# Patient Record
Sex: Female | Born: 1960 | Race: White | Hispanic: No | Marital: Married | Smoking: Current every day smoker
Health system: Southern US, Community
[De-identification: ages and names within clinical notes are randomized; demographics above are authoritative.]

## PROBLEM LIST (undated history)

## (undated) DIAGNOSIS — Z87442 Personal history of urinary calculi: Secondary | ICD-10-CM

## (undated) DIAGNOSIS — J40 Bronchitis, not specified as acute or chronic: Secondary | ICD-10-CM

## (undated) DIAGNOSIS — R7303 Prediabetes: Secondary | ICD-10-CM

## (undated) DIAGNOSIS — K219 Gastro-esophageal reflux disease without esophagitis: Secondary | ICD-10-CM

## (undated) DIAGNOSIS — N920 Excessive and frequent menstruation with regular cycle: Secondary | ICD-10-CM

## (undated) DIAGNOSIS — F4320 Adjustment disorder, unspecified: Secondary | ICD-10-CM

## (undated) DIAGNOSIS — Z720 Tobacco use: Secondary | ICD-10-CM

## (undated) DIAGNOSIS — Z8669 Personal history of other diseases of the nervous system and sense organs: Secondary | ICD-10-CM

## (undated) DIAGNOSIS — R9431 Abnormal electrocardiogram [ECG] [EKG]: Secondary | ICD-10-CM

## (undated) DIAGNOSIS — E782 Mixed hyperlipidemia: Secondary | ICD-10-CM

## (undated) DIAGNOSIS — J449 Chronic obstructive pulmonary disease, unspecified: Secondary | ICD-10-CM

## (undated) DIAGNOSIS — M1990 Unspecified osteoarthritis, unspecified site: Secondary | ICD-10-CM

## (undated) DIAGNOSIS — I739 Peripheral vascular disease, unspecified: Secondary | ICD-10-CM

## (undated) DIAGNOSIS — E7800 Pure hypercholesterolemia, unspecified: Secondary | ICD-10-CM

## (undated) DIAGNOSIS — E039 Hypothyroidism, unspecified: Secondary | ICD-10-CM

## (undated) DIAGNOSIS — M4802 Spinal stenosis, cervical region: Secondary | ICD-10-CM

## (undated) DIAGNOSIS — M4692 Unspecified inflammatory spondylopathy, cervical region: Secondary | ICD-10-CM

## (undated) DIAGNOSIS — M5412 Radiculopathy, cervical region: Secondary | ICD-10-CM

## (undated) DIAGNOSIS — F17200 Nicotine dependence, unspecified, uncomplicated: Secondary | ICD-10-CM

## (undated) HISTORY — DX: Chronic obstructive pulmonary disease, unspecified: J44.9

## (undated) HISTORY — DX: Adjustment disorder, unspecified: F43.20

## (undated) HISTORY — PX: PELVIC LAPAROSCOPY: SHX162

## (undated) HISTORY — DX: Bronchitis, not specified as acute or chronic: J40

## (undated) HISTORY — DX: Nicotine dependence, unspecified, uncomplicated: F17.200

## (undated) HISTORY — DX: Excessive and frequent menstruation with regular cycle: N92.0

## (undated) HISTORY — DX: Abnormal electrocardiogram (ECG) (EKG): R94.31

## (undated) HISTORY — DX: Pure hypercholesterolemia, unspecified: E78.00

## (undated) HISTORY — PX: ESOPHAGOGASTRODUODENOSCOPY: SHX1529

## (undated) HISTORY — PX: COLONOSCOPY: SHX174

## (undated) HISTORY — PX: HAMMER TOE SURGERY: SHX385

## (undated) HISTORY — DX: Hypothyroidism, unspecified: E03.9

## (undated) HISTORY — DX: Unspecified osteoarthritis, unspecified site: M19.90

## (undated) HISTORY — DX: Personal history of other diseases of the nervous system and sense organs: Z86.69

## (undated) HISTORY — PX: TUBAL LIGATION: SHX77

---

## 1998-03-07 ENCOUNTER — Encounter: Admission: RE | Admit: 1998-03-07 | Discharge: 1998-03-07 | Payer: Self-pay | Admitting: *Deleted

## 1999-09-13 ENCOUNTER — Emergency Department (HOSPITAL_COMMUNITY): Admission: EM | Admit: 1999-09-13 | Discharge: 1999-09-13 | Payer: Self-pay | Admitting: Emergency Medicine

## 2000-08-07 ENCOUNTER — Emergency Department (HOSPITAL_COMMUNITY): Admission: EM | Admit: 2000-08-07 | Discharge: 2000-08-08 | Payer: Self-pay | Admitting: Emergency Medicine

## 2000-08-08 ENCOUNTER — Encounter: Payer: Self-pay | Admitting: Emergency Medicine

## 2000-09-17 ENCOUNTER — Inpatient Hospital Stay (HOSPITAL_COMMUNITY): Admission: EM | Admit: 2000-09-17 | Discharge: 2000-09-18 | Payer: Self-pay | Admitting: *Deleted

## 2000-12-10 ENCOUNTER — Emergency Department (HOSPITAL_COMMUNITY): Admission: EM | Admit: 2000-12-10 | Discharge: 2000-12-10 | Payer: Self-pay | Admitting: *Deleted

## 2001-02-06 ENCOUNTER — Emergency Department (HOSPITAL_COMMUNITY): Admission: EM | Admit: 2001-02-06 | Discharge: 2001-02-06 | Payer: Self-pay | Admitting: Emergency Medicine

## 2001-02-06 ENCOUNTER — Encounter: Payer: Self-pay | Admitting: Emergency Medicine

## 2001-03-11 ENCOUNTER — Other Ambulatory Visit: Admission: RE | Admit: 2001-03-11 | Discharge: 2001-03-11 | Payer: Self-pay | Admitting: *Deleted

## 2001-04-19 ENCOUNTER — Ambulatory Visit (HOSPITAL_COMMUNITY): Admission: RE | Admit: 2001-04-19 | Discharge: 2001-04-19 | Payer: Self-pay | Admitting: *Deleted

## 2001-04-19 ENCOUNTER — Encounter (INDEPENDENT_AMBULATORY_CARE_PROVIDER_SITE_OTHER): Payer: Self-pay

## 2001-09-21 ENCOUNTER — Emergency Department (HOSPITAL_COMMUNITY): Admission: EM | Admit: 2001-09-21 | Discharge: 2001-09-21 | Payer: Self-pay

## 2002-02-18 ENCOUNTER — Emergency Department (HOSPITAL_COMMUNITY): Admission: EM | Admit: 2002-02-18 | Discharge: 2002-02-18 | Payer: Self-pay | Admitting: Emergency Medicine

## 2002-05-19 ENCOUNTER — Emergency Department (HOSPITAL_COMMUNITY): Admission: EM | Admit: 2002-05-19 | Discharge: 2002-05-19 | Payer: Self-pay | Admitting: Emergency Medicine

## 2002-06-12 ENCOUNTER — Emergency Department (HOSPITAL_COMMUNITY): Admission: EM | Admit: 2002-06-12 | Discharge: 2002-06-12 | Payer: Self-pay | Admitting: Emergency Medicine

## 2002-06-12 ENCOUNTER — Encounter: Payer: Self-pay | Admitting: Emergency Medicine

## 2002-10-20 ENCOUNTER — Emergency Department (HOSPITAL_COMMUNITY): Admission: EM | Admit: 2002-10-20 | Discharge: 2002-10-20 | Payer: Self-pay | Admitting: Emergency Medicine

## 2003-04-25 ENCOUNTER — Emergency Department (HOSPITAL_COMMUNITY): Admission: EM | Admit: 2003-04-25 | Discharge: 2003-04-26 | Payer: Self-pay | Admitting: Emergency Medicine

## 2004-04-08 ENCOUNTER — Emergency Department (HOSPITAL_COMMUNITY): Admission: EM | Admit: 2004-04-08 | Discharge: 2004-04-08 | Payer: Self-pay | Admitting: Emergency Medicine

## 2004-05-28 ENCOUNTER — Emergency Department (HOSPITAL_COMMUNITY): Admission: EM | Admit: 2004-05-28 | Discharge: 2004-05-28 | Payer: Self-pay | Admitting: Emergency Medicine

## 2004-07-03 ENCOUNTER — Emergency Department (HOSPITAL_COMMUNITY): Admission: EM | Admit: 2004-07-03 | Discharge: 2004-07-03 | Payer: Self-pay | Admitting: Emergency Medicine

## 2005-06-10 ENCOUNTER — Emergency Department (HOSPITAL_COMMUNITY): Admission: EM | Admit: 2005-06-10 | Discharge: 2005-06-10 | Payer: Self-pay | Admitting: Emergency Medicine

## 2005-07-28 ENCOUNTER — Emergency Department (HOSPITAL_COMMUNITY): Admission: EM | Admit: 2005-07-28 | Discharge: 2005-07-28 | Payer: Self-pay | Admitting: Emergency Medicine

## 2005-08-26 ENCOUNTER — Ambulatory Visit: Payer: Self-pay | Admitting: Family Medicine

## 2005-10-22 ENCOUNTER — Ambulatory Visit: Payer: Self-pay | Admitting: Family Medicine

## 2005-10-30 ENCOUNTER — Ambulatory Visit: Payer: Self-pay | Admitting: Family Medicine

## 2005-10-30 ENCOUNTER — Other Ambulatory Visit: Admission: RE | Admit: 2005-10-30 | Discharge: 2005-10-30 | Payer: Self-pay | Admitting: Family Medicine

## 2005-10-30 LAB — CONVERTED CEMR LAB
Pap Smear: NORMAL
TSH: 4.73 u[IU]/mL

## 2005-11-13 ENCOUNTER — Ambulatory Visit: Payer: Self-pay | Admitting: Obstetrics & Gynecology

## 2005-11-19 ENCOUNTER — Ambulatory Visit (HOSPITAL_COMMUNITY): Admission: RE | Admit: 2005-11-19 | Discharge: 2005-11-19 | Payer: Self-pay | Admitting: Obstetrics and Gynecology

## 2005-11-19 ENCOUNTER — Encounter: Admission: RE | Admit: 2005-11-19 | Discharge: 2005-11-19 | Payer: Self-pay | Admitting: Family Medicine

## 2005-11-24 ENCOUNTER — Encounter: Admission: RE | Admit: 2005-11-24 | Discharge: 2005-11-24 | Payer: Self-pay | Admitting: Family Medicine

## 2005-12-04 ENCOUNTER — Ambulatory Visit: Payer: Self-pay | Admitting: Obstetrics & Gynecology

## 2006-07-19 ENCOUNTER — Emergency Department (HOSPITAL_COMMUNITY): Admission: EM | Admit: 2006-07-19 | Discharge: 2006-07-19 | Payer: Self-pay | Admitting: Emergency Medicine

## 2007-01-18 ENCOUNTER — Emergency Department (HOSPITAL_COMMUNITY): Admission: EM | Admit: 2007-01-18 | Discharge: 2007-01-18 | Payer: Self-pay | Admitting: Family Medicine

## 2007-03-16 ENCOUNTER — Encounter: Payer: Self-pay | Admitting: Family Medicine

## 2007-03-16 DIAGNOSIS — N926 Irregular menstruation, unspecified: Secondary | ICD-10-CM | POA: Insufficient documentation

## 2007-03-16 DIAGNOSIS — E039 Hypothyroidism, unspecified: Secondary | ICD-10-CM | POA: Insufficient documentation

## 2007-03-16 DIAGNOSIS — J309 Allergic rhinitis, unspecified: Secondary | ICD-10-CM | POA: Insufficient documentation

## 2007-03-16 DIAGNOSIS — L509 Urticaria, unspecified: Secondary | ICD-10-CM | POA: Insufficient documentation

## 2007-03-16 DIAGNOSIS — F17200 Nicotine dependence, unspecified, uncomplicated: Secondary | ICD-10-CM | POA: Insufficient documentation

## 2007-03-16 DIAGNOSIS — J4489 Other specified chronic obstructive pulmonary disease: Secondary | ICD-10-CM | POA: Insufficient documentation

## 2007-03-16 DIAGNOSIS — F4320 Adjustment disorder, unspecified: Secondary | ICD-10-CM | POA: Insufficient documentation

## 2007-03-16 DIAGNOSIS — Z87898 Personal history of other specified conditions: Secondary | ICD-10-CM | POA: Insufficient documentation

## 2007-03-16 DIAGNOSIS — J449 Chronic obstructive pulmonary disease, unspecified: Secondary | ICD-10-CM | POA: Insufficient documentation

## 2007-03-23 ENCOUNTER — Ambulatory Visit: Payer: Self-pay | Admitting: Family Medicine

## 2007-03-23 DIAGNOSIS — R9431 Abnormal electrocardiogram [ECG] [EKG]: Secondary | ICD-10-CM | POA: Insufficient documentation

## 2007-03-24 ENCOUNTER — Ambulatory Visit: Payer: Self-pay | Admitting: Family Medicine

## 2007-04-09 ENCOUNTER — Ambulatory Visit: Payer: Self-pay | Admitting: Cardiology

## 2007-04-14 LAB — CONVERTED CEMR LAB: TSH: 19.3 microintl units/mL — ABNORMAL HIGH (ref 0.35–5.50)

## 2007-05-06 ENCOUNTER — Emergency Department (HOSPITAL_COMMUNITY): Admission: EM | Admit: 2007-05-06 | Discharge: 2007-05-06 | Payer: Self-pay | Admitting: Emergency Medicine

## 2007-05-11 ENCOUNTER — Ambulatory Visit: Payer: Self-pay | Admitting: Family Medicine

## 2007-05-14 ENCOUNTER — Encounter (INDEPENDENT_AMBULATORY_CARE_PROVIDER_SITE_OTHER): Payer: Self-pay | Admitting: *Deleted

## 2007-05-14 LAB — CONVERTED CEMR LAB: TSH: 10.14 microintl units/mL — ABNORMAL HIGH (ref 0.35–5.50)

## 2007-06-28 ENCOUNTER — Encounter (INDEPENDENT_AMBULATORY_CARE_PROVIDER_SITE_OTHER): Payer: Self-pay | Admitting: *Deleted

## 2007-07-06 ENCOUNTER — Ambulatory Visit: Payer: Self-pay | Admitting: Family Medicine

## 2007-07-12 ENCOUNTER — Encounter (INDEPENDENT_AMBULATORY_CARE_PROVIDER_SITE_OTHER): Payer: Self-pay | Admitting: *Deleted

## 2007-07-12 LAB — CONVERTED CEMR LAB: TSH: 7.73 microintl units/mL — ABNORMAL HIGH (ref 0.35–5.50)

## 2007-08-26 ENCOUNTER — Ambulatory Visit: Payer: Self-pay | Admitting: Family Medicine

## 2007-08-27 LAB — CONVERTED CEMR LAB: TSH: 5.28 microintl units/mL (ref 0.35–5.50)

## 2008-02-29 ENCOUNTER — Ambulatory Visit: Payer: Self-pay | Admitting: Family Medicine

## 2008-02-29 DIAGNOSIS — E7800 Pure hypercholesterolemia, unspecified: Secondary | ICD-10-CM | POA: Insufficient documentation

## 2008-03-02 LAB — CONVERTED CEMR LAB
Cholesterol: 246 mg/dL (ref 0–200)
Direct LDL: 137.8 mg/dL
HDL: 59.7 mg/dL (ref >39.0)
TSH: 13.17 microintl units/mL — ABNORMAL HIGH (ref 0.35–5.50)
Total CHOL/HDL Ratio: 4.1
Triglycerides: 175 mg/dL — ABNORMAL HIGH (ref 0–149)
VLDL: 35 mg/dL (ref 0–40)

## 2008-03-15 ENCOUNTER — Ambulatory Visit: Payer: Self-pay | Admitting: Family Medicine

## 2008-05-11 ENCOUNTER — Ambulatory Visit: Payer: Self-pay | Admitting: Family Medicine

## 2008-05-15 LAB — CONVERTED CEMR LAB: TSH: 0.33 microintl units/mL — ABNORMAL LOW (ref 0.35–5.50)

## 2008-05-19 ENCOUNTER — Encounter (INDEPENDENT_AMBULATORY_CARE_PROVIDER_SITE_OTHER): Payer: Self-pay | Admitting: *Deleted

## 2008-06-27 ENCOUNTER — Ambulatory Visit: Payer: Self-pay | Admitting: Family Medicine

## 2008-06-30 LAB — CONVERTED CEMR LAB: TSH: 54.96 microintl units/mL — ABNORMAL HIGH (ref 0.35–5.50)

## 2008-07-08 ENCOUNTER — Emergency Department (HOSPITAL_COMMUNITY): Admission: EM | Admit: 2008-07-08 | Discharge: 2008-07-08 | Payer: Self-pay | Admitting: Family Medicine

## 2008-07-13 ENCOUNTER — Telehealth: Payer: Self-pay | Admitting: Family Medicine

## 2008-07-14 ENCOUNTER — Ambulatory Visit: Payer: Self-pay | Admitting: Family Medicine

## 2008-07-27 ENCOUNTER — Telehealth: Payer: Self-pay | Admitting: Family Medicine

## 2008-07-31 ENCOUNTER — Ambulatory Visit: Payer: Self-pay | Admitting: Family Medicine

## 2008-08-02 ENCOUNTER — Encounter (INDEPENDENT_AMBULATORY_CARE_PROVIDER_SITE_OTHER): Payer: Self-pay | Admitting: *Deleted

## 2008-08-02 LAB — CONVERTED CEMR LAB
Free T4: 0.8 ng/dL (ref 0.6–1.6)
TSH: 13.49 microintl units/mL — ABNORMAL HIGH (ref 0.35–5.50)

## 2008-10-02 ENCOUNTER — Ambulatory Visit: Payer: Self-pay | Admitting: Family Medicine

## 2008-10-02 LAB — CONVERTED CEMR LAB: TSH: 5.14 microintl units/mL (ref 0.35–5.50)

## 2008-10-05 ENCOUNTER — Ambulatory Visit: Payer: Self-pay | Admitting: Family Medicine

## 2008-11-21 ENCOUNTER — Telehealth: Payer: Self-pay | Admitting: Family Medicine

## 2009-01-26 ENCOUNTER — Ambulatory Visit: Payer: Self-pay | Admitting: Family Medicine

## 2009-02-02 ENCOUNTER — Ambulatory Visit: Payer: Self-pay | Admitting: Family Medicine

## 2009-02-02 ENCOUNTER — Encounter: Admission: RE | Admit: 2009-02-02 | Discharge: 2009-02-02 | Payer: Self-pay | Admitting: Family Medicine

## 2009-02-05 ENCOUNTER — Telehealth: Payer: Self-pay | Admitting: Family Medicine

## 2009-02-05 LAB — CONVERTED CEMR LAB: TSH: 2.587 microintl units/mL (ref 0.350–4.500)

## 2009-02-14 ENCOUNTER — Telehealth: Payer: Self-pay | Admitting: Family Medicine

## 2009-03-05 ENCOUNTER — Ambulatory Visit: Payer: Self-pay | Admitting: Family Medicine

## 2009-03-05 DIAGNOSIS — N920 Excessive and frequent menstruation with regular cycle: Secondary | ICD-10-CM | POA: Insufficient documentation

## 2009-03-07 ENCOUNTER — Telehealth: Payer: Self-pay | Admitting: Family Medicine

## 2009-03-09 LAB — CONVERTED CEMR LAB
Basophils Absolute: 0.1 10*3/uL (ref 0.0–0.1)
Basophils Relative: 1.5 % (ref 0.0–3.0)
Eosinophils Absolute: 0.1 10*3/uL (ref 0.0–0.7)
Eosinophils Relative: 1.7 % (ref 0.0–5.0)
HCT: 39.1 % (ref 36.0–46.0)
Hemoglobin: 13.2 g/dL (ref 12.0–15.0)
Lymphocytes Relative: 33.8 % (ref 12.0–46.0)
Lymphs Abs: 2.9 10*3/uL (ref 0.7–4.0)
MCHC: 33.8 g/dL (ref 30.0–36.0)
MCV: 99.1 fL (ref 78.0–100.0)
Monocytes Absolute: 0.5 10*3/uL (ref 0.1–1.0)
Monocytes Relative: 6.2 % (ref 3.0–12.0)
Neutro Abs: 5.1 10*3/uL (ref 1.4–7.7)
Neutrophils Relative %: 56.8 % (ref 43.0–77.0)
Platelets: 221 10*3/uL (ref 150.0–400.0)
RBC: 3.95 M/uL (ref 3.87–5.11)
RDW: 12.3 % (ref 11.5–14.6)
WBC: 8.7 10*3/uL (ref 4.5–10.5)
hCG, Beta Chain, Quant, S: 0.69 milliintl units/mL

## 2009-03-20 ENCOUNTER — Encounter: Payer: Self-pay | Admitting: Obstetrics & Gynecology

## 2009-03-20 ENCOUNTER — Ambulatory Visit: Payer: Self-pay | Admitting: Obstetrics & Gynecology

## 2009-04-10 ENCOUNTER — Ambulatory Visit: Payer: Self-pay | Admitting: Obstetrics & Gynecology

## 2009-04-11 ENCOUNTER — Ambulatory Visit: Payer: Self-pay | Admitting: Family Medicine

## 2009-05-09 ENCOUNTER — Ambulatory Visit: Payer: Self-pay | Admitting: Obstetrics & Gynecology

## 2009-05-09 LAB — CONVERTED CEMR LAB
ALT: 26 units/L (ref 0–35)
AST: 21 units/L (ref 0–37)
Albumin: 4.3 g/dL (ref 3.5–5.2)
Alkaline Phosphatase: 89 units/L (ref 39–117)
BUN: 13 mg/dL (ref 6–23)
CO2: 23 meq/L (ref 19–32)
Calcium: 9.1 mg/dL (ref 8.4–10.5)
Chloride: 102 meq/L (ref 96–112)
Creatinine, Ser: 0.84 mg/dL (ref 0.40–1.20)
Glucose, Bld: 75 mg/dL (ref 70–99)
HCT: 43.8 % (ref 36.0–46.0)
Hemoglobin: 14.3 g/dL (ref 12.0–15.0)
MCHC: 32.6 g/dL (ref 30.0–36.0)
MCV: 97.6 fL (ref 78.0–100.0)
Platelets: 249 10*3/uL (ref 150–400)
Potassium: 4.6 meq/L (ref 3.5–5.3)
RBC: 4.49 M/uL (ref 3.87–5.11)
RDW: 13.1 % (ref 11.5–15.5)
Sodium: 139 meq/L (ref 135–145)
Total Bilirubin: 0.4 mg/dL (ref 0.3–1.2)
Total Protein: 6.6 g/dL (ref 6.0–8.3)
WBC: 8.4 10*3/uL (ref 4.0–10.5)

## 2009-05-25 ENCOUNTER — Ambulatory Visit (HOSPITAL_COMMUNITY): Admission: RE | Admit: 2009-05-25 | Discharge: 2009-05-25 | Payer: Self-pay | Admitting: Obstetrics & Gynecology

## 2009-12-03 ENCOUNTER — Telehealth: Payer: Self-pay | Admitting: Family Medicine

## 2009-12-05 ENCOUNTER — Ambulatory Visit: Payer: Self-pay | Admitting: Family Medicine

## 2009-12-10 ENCOUNTER — Telehealth: Payer: Self-pay | Admitting: Family Medicine

## 2009-12-12 LAB — CONVERTED CEMR LAB: TSH: 7.63 microintl units/mL — ABNORMAL HIGH (ref 0.35–5.50)

## 2009-12-13 ENCOUNTER — Encounter (INDEPENDENT_AMBULATORY_CARE_PROVIDER_SITE_OTHER): Payer: Self-pay | Admitting: *Deleted

## 2010-01-24 ENCOUNTER — Encounter: Admission: RE | Admit: 2010-01-24 | Discharge: 2010-01-24 | Payer: Self-pay | Admitting: Internal Medicine

## 2010-05-24 ENCOUNTER — Ambulatory Visit: Payer: Self-pay | Admitting: Family Medicine

## 2010-05-24 DIAGNOSIS — J019 Acute sinusitis, unspecified: Secondary | ICD-10-CM | POA: Insufficient documentation

## 2010-05-26 LAB — CONVERTED CEMR LAB
Glucose, Bld: 67 mg/dL — ABNORMAL LOW (ref 70–99)
TSH: 6.5 microintl units/mL — ABNORMAL HIGH (ref 0.35–5.50)

## 2010-06-11 ENCOUNTER — Ambulatory Visit: Payer: Self-pay | Admitting: Family Medicine

## 2010-06-11 DIAGNOSIS — R05 Cough: Secondary | ICD-10-CM

## 2010-06-11 DIAGNOSIS — R059 Cough, unspecified: Secondary | ICD-10-CM | POA: Insufficient documentation

## 2010-07-14 ENCOUNTER — Encounter: Payer: Self-pay | Admitting: Obstetrics & Gynecology

## 2010-07-23 NOTE — Progress Notes (Signed)
Summary: time for blood work  Phone Note Call from Patient Call back at [employer] [phone]   Caller: Patient Call For: Judith Part MD Summary of Call: Pt says she is due for blood work, please advise on what to order and if you want to see her.  Please call her after 2 to let her know. Initial call taken by: Lowella Petties CMA,  December 03, 2009 12:49 PM  Follow-up for Phone Call        -+I want to check tsh for 244.9- when able  f/u with me in about 3 mo  Follow-up by: Judith Part MD,  December 03, 2009 1:23 PM  Additional Follow-up for Phone Call Additional follow up Details #1::        Patient Advised.   Lab appointment scheduled:  12/05/09.  Patient wishes to wait until after labs to schedule OV. Additional Follow-up by: Delilah Shan CMA Duncan Dull),  December 03, 2009 3:11 PM

## 2010-07-23 NOTE — Assessment & Plan Note (Signed)
Summary: SINUS INFECTION/DLO   Vital Signs:  Patient profile:   50 year old female Height:      61 inches Weight:      116.50 pounds BMI:     22.09 Temp:     98 degrees F oral Pulse rate:   84 / minute Pulse rhythm:   regular BP sitting:   110 / 60  (left arm) Cuff size:   regular  Vitals Entered By: Lewanda Rife LPN (May 24, 2010 10:29 AM) CC: sinus bothering pt, h/a facial,l pain, productive cough with yellow green mucus, night sweats   History of Present Illness: sinus problems started getting it last week  started with uri symptoms  is now having headache and facial pain  taking her allegra and mucinex and 12 hour store brand cough med  mucous is yellow and green from nose  cough is dry   is still smoking -- is really working on cutting down  less than 1/2 ppd not ready to set quit date due to stress     took flu shot for job     also due for tsh check  Allergies (verified): No Known Drug Allergies  Past History:  Past Medical History: Last updated: 07/14/2008 Allergic rhinitis COPD Hypothyroidism 10/08 abn ETT-- pt refused further evaluation smoker   cardiol-- Dr Daleen Squibb  Past Surgical History: Last updated: 03/15/2008 Hammer toes- surgery Laparoscopy- pelvic Tubal ligation Pneumonia (08/2000) Pelvic US- neg (08/2005) abn Exercise stress test 10/08 pneumonia- tx as outpt 08  Family History: Last updated: 03/16/2007 Father: no contact Mother: ? depression Siblings:   Social History: Last updated: 01/26/2009 Marital Status: Married-- much stress at home with marriage Children: 3 Occupation: Futures trader  Risk Factors: Smoking Status: current (03/16/2007)  Review of Systems General:  Complains of chills, fatigue, and malaise. Eyes:  Denies blurring, discharge, and eye irritation. ENT:  Complains of earache, nasal congestion, postnasal drainage, sinus pressure, and sore throat; denies ear discharge. CV:  Denies chest  pain or discomfort and palpitations. Resp:  Complains of cough and sputum productive; denies wheezing. GI:  Denies diarrhea, nausea, and vomiting. MS:  Denies cramps. Derm:  Denies itching, lesion(s), poor wound healing, and rash. Neuro:  Denies numbness and tingling. Psych:  Denies anxiety and depression. Endo:  Denies cold intolerance, excessive thirst, excessive urination, and heat intolerance. Heme:  Denies abnormal bruising and bleeding.  Physical Exam  General:  slim and well appearing Head:  tender sinuses diffusely -- frontal and maxillary normocephalic, atraumatic, and no abnormalities observed.   Eyes:  vision grossly intact, pupils equal, pupils round, pupils reactive to light, and no injection.   Ears:  TMs are dull Nose:  nares are injected and congested bilaterally  Mouth:  pharynx pink and moist.   Neck:  supple with full rom and no masses or thyromegally, no JVD or carotid bruit  Lungs:  difusely distant bs cta  no wheeze or rales  Heart:  Normal rate and regular rhythm. S1 and S2 normal without gallop, murmur, click, rub or other extra sounds. Skin:  Intact without suspicious lesions or rashes Cervical Nodes:  No lymphadenopathy noted Psych:  normal affect, talkative and pleasant    Impression & Recommendations:  Problem # 1:  SINUSITIS - ACUTE-NOS (ICD-461.9) Assessment New tx with augmentin  adv to quit smoking  tessalon for cough  pt advised to update me if symptoms worsen or do not improve recommend sympt care- see pt instructions   Her  updated medication list for this problem includes:    Flonase 50 Mcg/act Susp (Fluticasone propionate) .Marland Kitchen... As needed once a day    Mucinex 600 Mg Xr12h-tab (Guaifenesin) ..... Otc as directed.    Delsym 30 Mg/76ml Lqcr (Dextromethorphan polistirex) ..... Otc as directed.    Augmentin 875-125 Mg Tabs (Amoxicillin-pot clavulanate) .Marland Kitchen... 1 by mouth two times a day for 10 days for sinus infection    Tessalon 200 Mg Caps  (Benzonatate) .Marland Kitchen... 1 by mouth three times a day as needed for cough  Orders: Prescription Created Electronically [phone])  Problem # 2:  Hx of TOBACCO ABUSE (ICD-305.1) Assessment: Unchanged discussed in detail risks of smoking, and possible outcomes including COPD, vascular dz, cancer and also respiratory infections/sinus problems  pt voiced understanding  is cutting back  Problem # 3:  HYPOTHYROIDISM (ICD-244.9) Assessment: Unchanged overdue tsh no clinical changes  no missed doses after result wants 90 d px for med to pharmacy Her updated medication list for this problem includes:    Synthroid 100 Mcg Tabs (Levothyroxine sodium) .Marland Kitchen... Alternate 1/2 tab and 1 day everyday  Orders: Venipuncture (95284) TLB-TSH (Thyroid Stimulating Hormone) (84443-TSH) TLB-Glucose, QUANT (82947-GLU)  Complete Medication List: 1)  Synthroid 100 Mcg Tabs (Levothyroxine sodium) .... Alternate 1/2 tab and 1 day everyday 2)  Flonase 50 Mcg/act Susp (Fluticasone propionate) .... As needed once a day 3)  Tizanidine Hcl 4 Mg Tabs (Tizanidine hcl) .Marland Kitchen.. 1 by mouth at bedtime as needed 4)  Ibuprofen 800 Mg Tabs (Ibuprofen) .... Take one tab up to three times a day as needed neck pain 5)  Allegra Allergy 180 Mg Tabs (Fexofenadine hcl) .... Otc as directed. 6)  Mucinex 600 Mg Xr12h-tab (Guaifenesin) .... Otc as directed. 7)  Delsym 30 Mg/18ml Lqcr (Dextromethorphan polistirex) .... Otc as directed. 8)  Augmentin 875-125 Mg Tabs (Amoxicillin-pot clavulanate) .Marland Kitchen.. 1 by mouth two times a day for 10 days for sinus infection 9)  Tessalon 200 Mg Caps (Benzonatate) .Marland Kitchen.. 1 by mouth three times a day as needed for cough  Patient Instructions: 1)  you can try mucinex over the counter twice daily as directed and nasal saline spray for congestion 2)  tylenol over the counter as directed may help with aches, headache and fever 3)  call if symptoms worsen or if not improved in 4-5 days  4)  please make effort to quit  smoking  5)  take the augmentin for sinus infection and also tessalon for cough  Prescriptions: TESSALON 200 MG CAPS (BENZONATATE) 1 by mouth three times a day as needed for cough  #30 x 0   Entered and Authorized by:   Judith Part MD   Signed by:   Judith Part MD on 05/24/2010   Method used:   Electronically to        [facility] [phone]* (retail)       [address]       [place], [place]  [address]       Ph: [phone]       Fax: [phone]   RxID:   [id number] AUGMENTIN 875-125 MG TABS (AMOXICILLIN-POT CLAVULANATE) 1 by mouth two times a day for 10 days for sinus infection  #20 x 0   Entered and Authorized by:   Judith Part MD   Signed by:   Judith Part MD on 05/24/2010   Method used:   Electronically to        [facility] [phone]* (retail)  [address]       [place], [place]  [address]       Ph: [phone]       Fax: [phone]   RxID:   [id number]    Orders Added: 1)  Venipuncture [25956] 2)  TLB-TSH (Thyroid Stimulating Hormone) [84443-TSH] 3)  TLB-Glucose, QUANT [82947-GLU] 4)  Prescription Created Electronically [G8553] 5)  Est. Patient Level III [38756]    Current Allergies (reviewed today): No known allergies

## 2010-07-23 NOTE — Progress Notes (Signed)
Summary: regarding synthroid  Phone Note Call from Patient Call back at Home Phone [phone]   Caller: Patient Call For: Judith Part MD Summary of Call: Pt had told me that she only missed one dose on thyroid medicine but now she says she probably missed several because sometimes she was not able to afford it and had to wait a few days before she could get it.  She is coming in to see you in september and asks if you want her to continue with what's she's doing until then. Initial call taken by: Lowella Petties CMA,  December 10, 2009 3:48 PM  Follow-up for Phone Call        Dr. Milinda Antis has addressed this and I have been trying to get her on the phone.  There is no answer and no VM.  If she calls back, please see lab report on my desktop. Follow-up by: Delilah Shan CMA Duncan Dull),  December 11, 2009 9:36 AM  Additional Follow-up for Phone Call Additional follow up Details #1::        if she has missed that many doses --can probably continue this dosing and re check in fall Additional Follow-up by: Judith Part MD,  December 12, 2009 3:14 PM

## 2010-07-23 NOTE — Letter (Signed)
Summary: Out of Work  [facility] at [facility]  [address] [place], [place] [address]   Phone: [phone]  Fax: [phone]    May 24, 2010   Employee:  Melanie Crazier    To Whom It May Concern:   For Medical reasons, please excuse the above named employee from work for the following dates:  Start:   05/20/2010  End:   05/23/2010  If you need additional information, please feel free to contact our office.         Sincerely,    Judith Part MD

## 2010-07-23 NOTE — Letter (Signed)
Summary: Results Follow up Letter  [place] at [facility]  [address] [place], [place] [address]   Phone: [phone]  Fax: [phone]    12/13/2009 MRN: [id number]    Lauren Beck [address] [place], [place]  [address]    Dear [name],  The following are the results of your recent test(s):  Test         Result    Pap Smear:        Normal _____  Not Normal _____ Comments: ______________________________________________________ Cholesterol: LDL(Bad cholesterol):         Your goal is less than:         HDL (Good cholesterol):       Your goal is more than: Comments:  ______________________________________________________ Mammogram:        Normal _____  Not Normal _____ Comments:  ___________________________________________________________________ Hemoccult:        Normal _____  Not normal _______ Comments:    _____________________________________________________________________ Other Tests:   Concerning your thyroid medication, if you have missed that many doses --you can probably continue this dosing and re check in the fall before we make any changes.     We routinely do not discuss normal results over the telephone.  If you desire a copy of the results, or you have any questions about this information we can discuss them at your next office visit.   Sincerely,    Marne A. Milinda Antis, M.D.  MAT:lsf

## 2010-07-25 ENCOUNTER — Other Ambulatory Visit: Payer: Self-pay | Admitting: Family Medicine

## 2010-07-25 ENCOUNTER — Other Ambulatory Visit: Payer: Self-pay

## 2010-07-25 ENCOUNTER — Encounter (INDEPENDENT_AMBULATORY_CARE_PROVIDER_SITE_OTHER): Payer: Self-pay | Admitting: *Deleted

## 2010-07-25 ENCOUNTER — Ambulatory Visit: Admit: 2010-07-25 | Payer: Self-pay | Admitting: Family Medicine

## 2010-07-25 DIAGNOSIS — E039 Hypothyroidism, unspecified: Secondary | ICD-10-CM

## 2010-07-25 LAB — TSH: TSH: 1.19 u[IU]/mL (ref 0.35–5.50)

## 2010-07-25 LAB — T4, FREE: Free T4: 1.08 ng/dL (ref 0.60–1.60)

## 2010-07-25 NOTE — Assessment & Plan Note (Signed)
Summary: SINUS SYMPTOMS ARE NO BETTER/ lb   Vital Signs:  Patient profile:   50 year old female Height:      61 inches Weight:      116.50 pounds BMI:     22.09 Temp:     97.7 degrees F oral Pulse rate:   72 / minute Pulse rhythm:   regular BP sitting:   116 / 80  (left arm) Cuff size:   large  Vitals Entered By: Delilah Shan CMA Duncan Dull) (June 11, 2010 9:09 AM) CC: Sinus symptoms are no better   History of Present Illness: 1/2 ppd smoker.  Was up to 1.5ppd.  Was seen by Dr. Milinda Antis recently.  Congestion is some better.  Cough worse at night.  Cough is the main issue.  No FCNAVD.  The prev sweats got some better.  No sputum now with cough.  Occ rhinorrhea, occ blood spotting with blowing nose.  Occ wheeze.  Using albuterol once - twice a day.  Transient relief.  prev was on advair.  Prev was on augmentin.    Allergies: No Known Drug Allergies  Review of Systems       See HPI.  Otherwise negative.    Physical Exam  General:  GEN: nad, alert and oriented HEENT: mucous membranes moist, TM w/o erythema, nasal epithelium injected, OP with cobblestoning NECK: supple w/o LA CV: rrr. PULM: no inc wob but diffuse exp wheeze.  no focal decrease in bs.  ABD: soft, +bs EXT: no edema    Impression & Recommendations:  Problem # 1:  COUGH (ICD-786.2) cont SABA and start advair 250/50, 1 puff two times a day and rinse mouth.  d/w patient [id number].  She understood.  okay for outpatient follow up.  Fu with Dr. Milinda Antis as needed.   Complete Medication List: 1)  Flonase 50 Mcg/act Susp (Fluticasone propionate) .... As needed once a day 2)  Tizanidine Hcl 4 Mg Tabs (Tizanidine hcl) .Marland Kitchen.. 1 by mouth at bedtime as needed 3)  Ibuprofen 800 Mg Tabs (Ibuprofen) .... Take one tab up to three times a day as needed neck pain 4)  Allegra Allergy 180 Mg Tabs (Fexofenadine hcl) .... Otc as directed. 5)  Mucinex 600 Mg Xr12h-tab (Guaifenesin) .... Otc as directed. 6)  Delsym 30 Mg/3ml Lqcr  (Dextromethorphan polistirex) .... Otc as directed. 7)  Augmentin 875-125 Mg Tabs (Amoxicillin-pot clavulanate) .Marland Kitchen.. 1 by mouth two times a day for 10 days for sinus infection 8)  Tessalon 200 Mg Caps (Benzonatate) .Marland Kitchen.. 1 by mouth three times a day as needed for cough 9)  Synthroid 88 Mcg Tabs (Levothyroxine sodium) .Marland Kitchen.. 1 by mouth qd 10)  Tussin Cf 30-10-100 Mg/4ml Liqd (Pseudoephedrine-dm-gg) .... As directed as needed . 11)  Ventolin Hfa 108 (90 Base) Mcg/act Aers (Albuterol sulfate) .... 2 puffs q4h as needed for cough 12)  Advair Diskus 250-50 Mcg/dose Aepb (Fluticasone-salmeterol) .Marland Kitchen.. 1 puff bid  Patient Instructions: 1)  Work on stopping smoking.   2)  Use the ventolin as needed- 2 puffs every 4 hours for cough. 3)  Start the advair 250/50 1 puff two times a day and rinse after use.  Let Dr. Milinda Antis know if you aren't gradually improving over the next few weeks.   4)  Take care.  Prescriptions: VENTOLIN HFA 108 (90 BASE) MCG/ACT AERS (ALBUTEROL SULFATE) 2 puffs q4h as needed for cough  #1 x 5   Entered and Authorized by:   Crawford Givens MD   Signed by:  Crawford Givens MD on 06/11/2010   Method used:   Electronically to        [facility] [phone]* (retail)       [address]       [place], [place]  [address]       Ph: [phone]       Fax: [phone]   RxID:   [id number]    Orders Added: 1)  Est. Patient Level III [23557]    Current Allergies (reviewed today): No known allergies

## 2010-08-05 ENCOUNTER — Telehealth: Payer: Self-pay | Admitting: Family Medicine

## 2010-08-14 NOTE — Progress Notes (Signed)
Summary: regarding neck pain  Phone Note Call from Patient Call back at Home Phone [phone]   Caller: Patient Call For: Lauren Beck Summary of Call: Pt was seen over a year ago by Dr. Patsy Lager for neck pain.  She says there is nothing she can do about this and the neck pain causes her to have frequent headaches.  She is asking what she can take for this.  She has been taking one ibuprofen, 600 mg's, but only takes this once in awhile.  Pains are sometimes sharp, like a knife.  Please advise on what else she can do.  Uses [facility] [address]. Initial call taken by: Lowella Petties CMA, AAMA,  August 05, 2010 3:09 PM  Follow-up for Phone Call        I would recommend flexeril and ibuprofen (if it does not bother stomach) (tizanadine is on med list instead of flexeril -- which one works better for her?- both muscle relaxers) can up ibuprofen to 800 PT eventually when she can afford it  if headache more than 2 d per week f/u let me know and I will px  Follow-up by: Lauren Beck,  August 05, 2010 5:05 PM  Additional Follow-up for Phone Call Additional follow up Details #1::        Patient notified as instructed by telephone. Pt said she wants to try the Tizanadine and also needs rx called in for Ibuprofen 800mg . Pt requested med sent to [facility] [address]  Pt said no need to call back to let her know med was called in [facility] always calls pt when ready.Lewanda Rife LPN  August 05, 2010 5:18 PM     Additional Follow-up for Phone Call Additional follow up Details #2::    px written on EMR for call in  Follow-up by: Lauren Beck,  August 05, 2010 5:23 PM  Additional Follow-up for Phone Call Additional follow up Details #3:: Details for Additional Follow-up Action Taken: Medications phoned to [facility] as instructed. Lewanda Rife LPN  August 05, 2010 5:27 PM   New/Updated Medications: TIZANIDINE HCL 4 MG TABS (TIZANIDINE HCL) 1 by mouth once daily as  needed headache / neck pain IBUPROFEN 800 MG TABS (IBUPROFEN) take one tab up to three times a day as needed neck pain or headache with food Prescriptions: IBUPROFEN 800 MG TABS (IBUPROFEN) take one tab up to three times a day as needed neck pain or headache with food  #60 x 3   Entered and Authorized by:   Lauren Beck   Signed by:   Lauren Beck on 08/05/2010   Method used:   Telephoned to ...       [facility] [address] [phone]* (retail)       [address]       [place], [place]  [address]       Ph: [phone]       Fax: [phone]   RxID:   [id number] TIZANIDINE HCL 4 MG TABS (TIZANIDINE HCL) 1 by mouth once daily as needed headache / neck pain  #30 x 1   Entered and Authorized by:   Lauren Beck   Signed by:   Lauren Beck on 08/05/2010   Method used:   Telephoned to ...       [facility] [phone]* (retail)       [address]  [place], [place]  [address]       Ph: [phone]       Fax: [phone]   RxID:   [id number]

## 2010-09-29 IMAGING — CT CT ABDOMEN W/ CM
2 of 5 series · 17 of 46 positions shown, 19 images · IV contrast (APPLIED)
Comparison: None

CT ABDOMEN

CLINICAL DATA: Progressive sharp right lower quadrant and pelvic
pain.  Clinical suspicion for appendicitis.  Previous surgery for
ovarian cysts.

CT ABDOMEN AND PELVIS WITH CONTRAST
TECHNIQUE: Multidetector CT imaging of the abdomen and pelvis was
performed using the standard protocol following bolus
administration of intravenous contrast.
Contrast: 80 ml Lmnipaque-CGG and oral contrast

[Series 2: abd/pelv with 5.0 b31f st · axial · 0.63mm/px · z∈[-445,-80]mm · 14 of 83 slices shown, 16 images]
[im 5/83  soft-tissue]
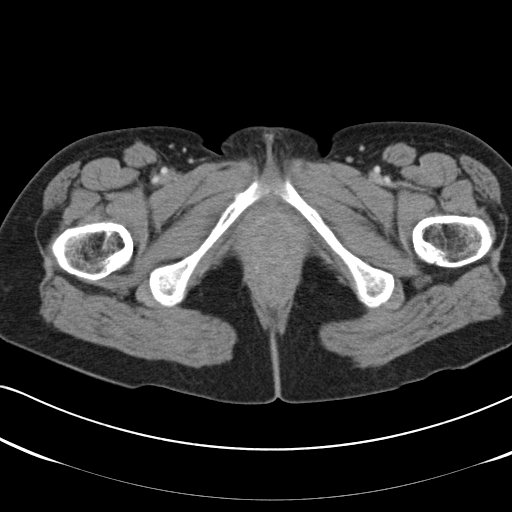
[im 5/83  bone]
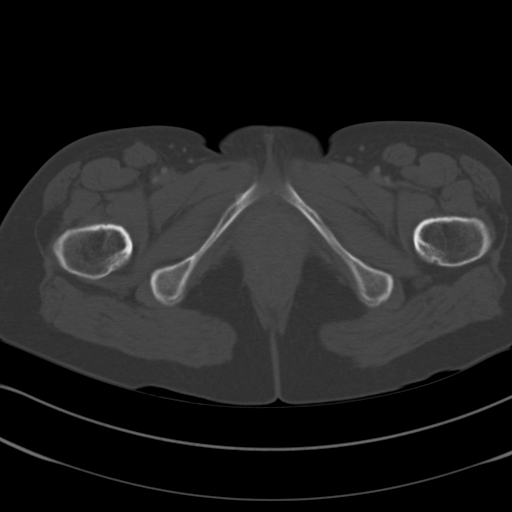
[im 10/83  soft-tissue]
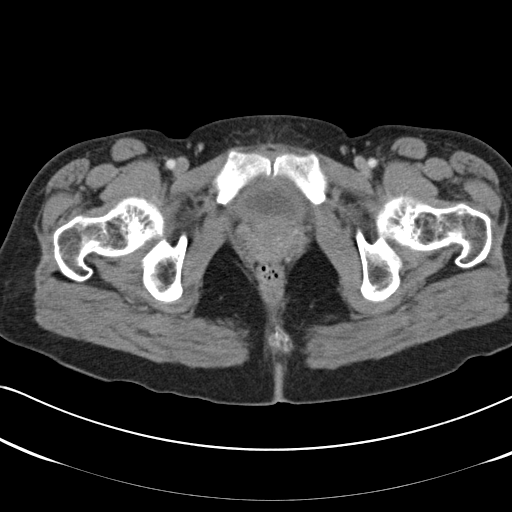
[im 19/83  soft-tissue]
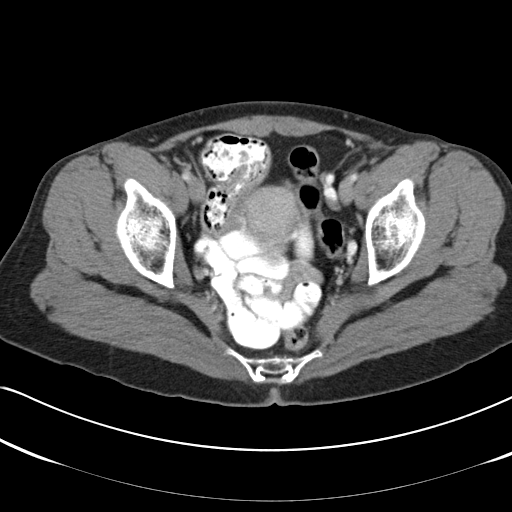
[im 23/83  soft-tissue]
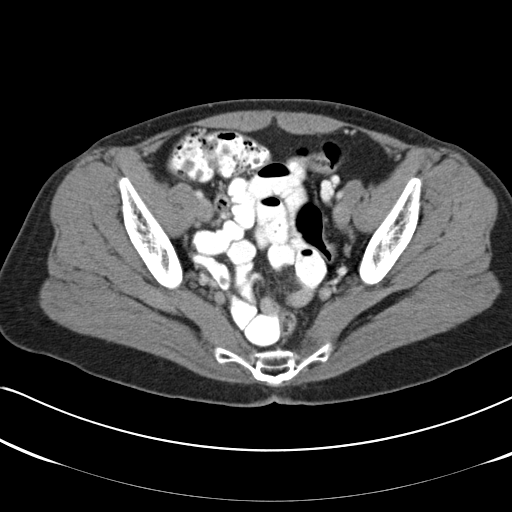
[im 28/83  soft-tissue]
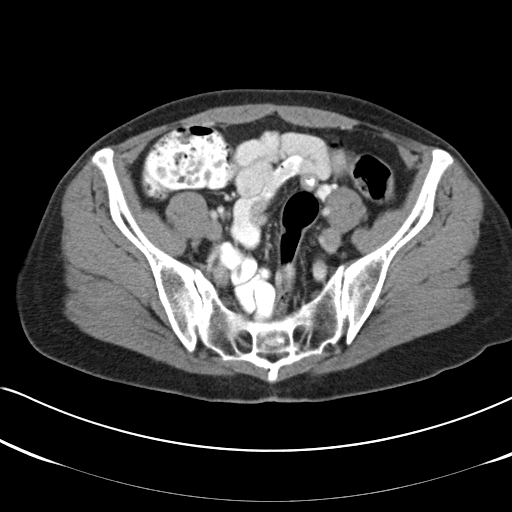
[im 32/83  soft-tissue]
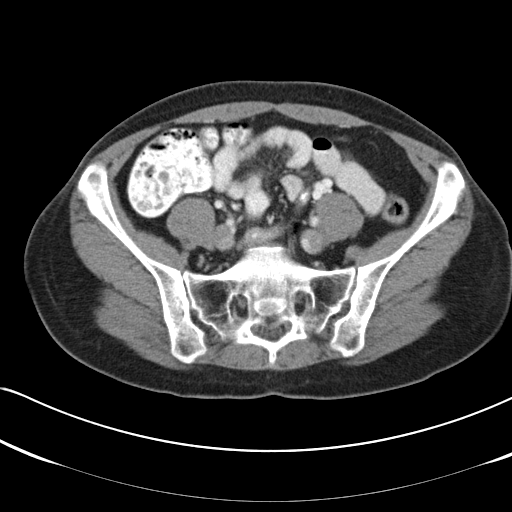
[im 37/83  soft-tissue]
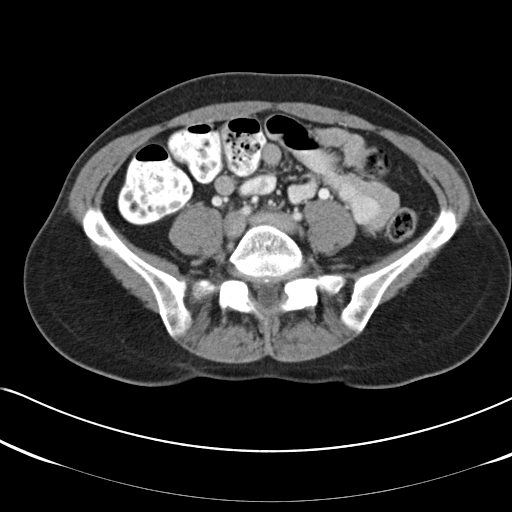
[im 46/83  soft-tissue]
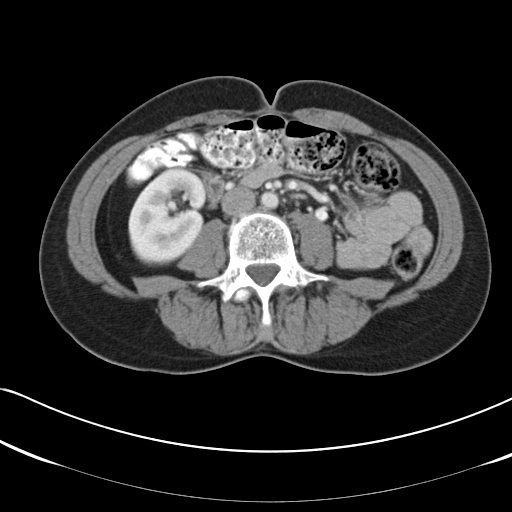
[im 51/83  soft-tissue]
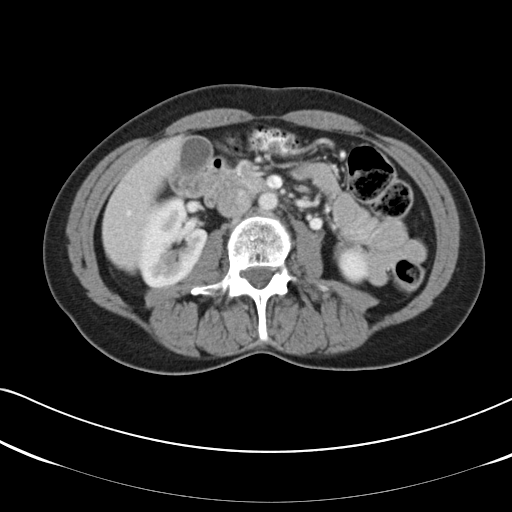
[im 51/83  bone]
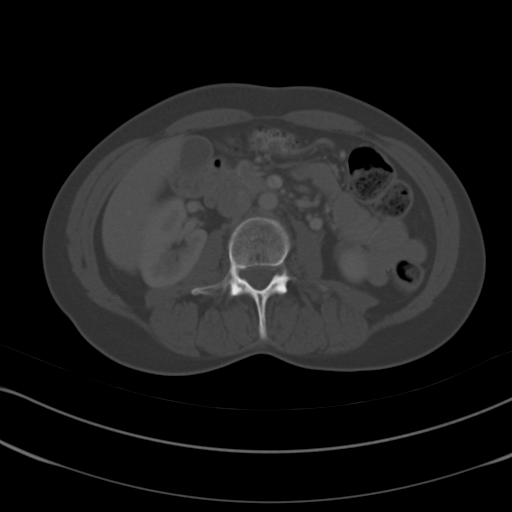
[im 55/83  soft-tissue]
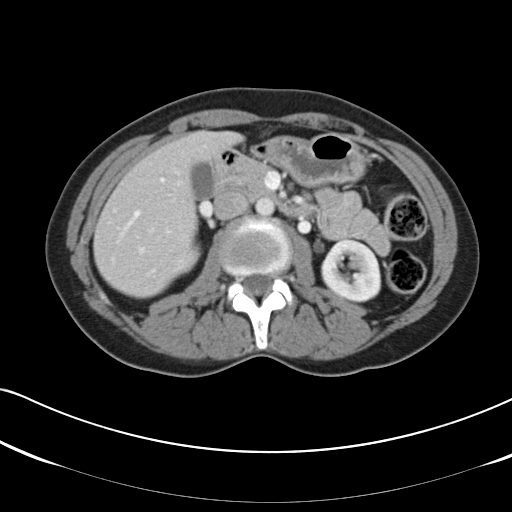
[im 60/83  soft-tissue]
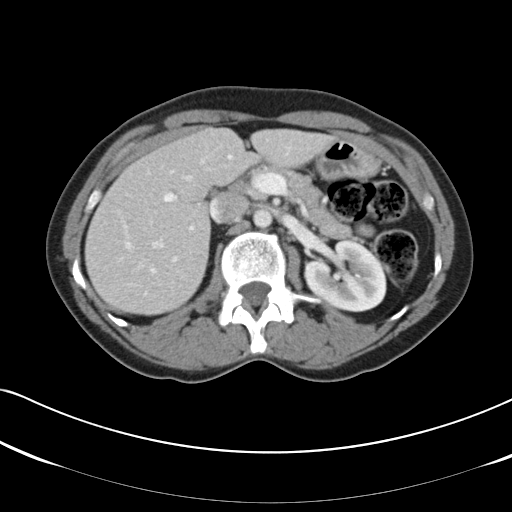
[im 64/83  soft-tissue]
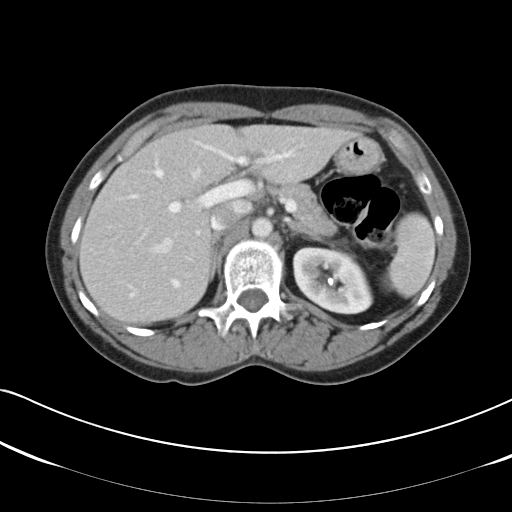
[im 73/83  soft-tissue]
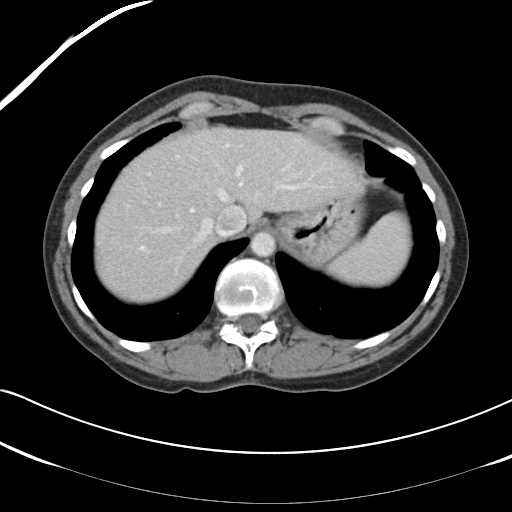
[im 78/83  soft-tissue]
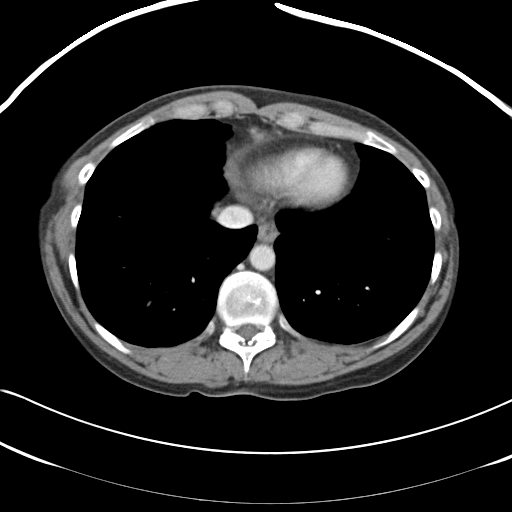

[Series 602: cor · coronal · 0.80mm/px · 3 of 62 slices shown]
[im 21/62  soft-tissue]
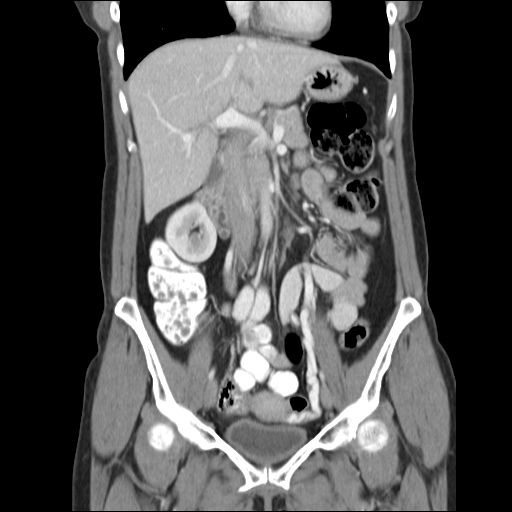
[im 28/62  soft-tissue]
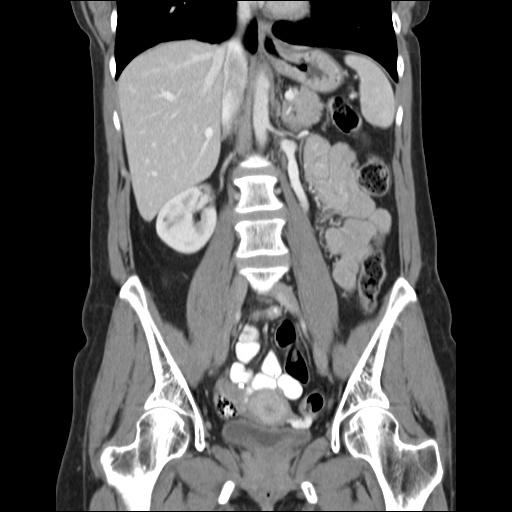
[im 34/62  soft-tissue]
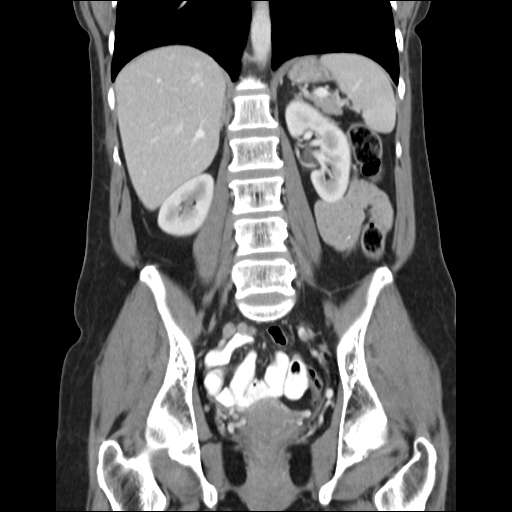

[17 of 46 positions shown; findings below may reference images not displayed]

FINDINGS: The liver, gallbladder, spleen, pancreas, adrenal
glands, and kidneys are normal appearance.  Gallbladder appears
normal, and there is no evidence of hydronephrosis.

No soft tissue masses or adenopathy identified.  There is no
evidence of inflammatory process or abnormal fluid collections.  No
evidence of dilated bowel loops.
IMPRESSION: Negative abdomen CT.

CT PELVIS
FINDINGS: Appendix is not well visualized on the study, however
there is no evidence of inflammatory process in the area the cecum
were elsewhere within the pelvis.  No abnormal fluid collections
are seen in the pelvis.  Uterus and adnexal regions are
unremarkable appearance.  No pelvic soft tissue masses or
adenopathy identified.  There is no evidence of dilated bowel loops
or hernia.
IMPRESSION: Negative pelvis CT.  No evidence of appendicitis or other acute
findings.

## 2010-10-03 ENCOUNTER — Other Ambulatory Visit: Payer: Self-pay | Admitting: Family Medicine

## 2010-10-03 DIAGNOSIS — E039 Hypothyroidism, unspecified: Secondary | ICD-10-CM

## 2010-10-04 ENCOUNTER — Other Ambulatory Visit (INDEPENDENT_AMBULATORY_CARE_PROVIDER_SITE_OTHER): Payer: Self-pay

## 2010-10-04 DIAGNOSIS — E039 Hypothyroidism, unspecified: Secondary | ICD-10-CM

## 2010-10-04 LAB — TSH: TSH: 2.417 u[IU]/mL (ref 0.350–4.500)

## 2010-10-04 NOTE — Progress Notes (Signed)
Addended byMills Koller on: 10/04/2010 03:28 PM   Modules accepted: Orders

## 2010-10-05 LAB — T4, FREE: Free T4: 1.37 ng/dL (ref 0.80–1.80)

## 2010-11-05 NOTE — Assessment & Plan Note (Signed)
[facility]                            CARDIOLOGY OFFICE NOTE   NAME:Lauren Beck                         MRN:          [id number]  DATE:04/09/2007                            DOB:          10/08/1960    [name] is referred today by Dr. 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  She brings copies of her  tracings today.   HISTORY OF PRESENT ILLNESS:  She is a pleasant 50 year old, married,  white female who comes in today with the above concern.  She performed  an exercise stress test on a bicycle on March 03, 2007.  She had ST  segment depression in III and aVF that was fairly horizontal.  It  persisted in recovery for several minutes.  This was not found on a  stress test in 2007.  She was told she had atherosclerosis of her  coronary arteries and needed clearance.  She has dyspnea on exertion but  denies any angina.  She is a smoker and has a chest x-ray from 2007,  that shows COPD.  She smokes about a 1/2 a pack to a pack per day.   Her other cardiac risk factors include:  1. Hyperlipidemia.  2. There is no premature history of coronary disease in her family.  3. She does not do any recreational drugs.  4. She is not diabetic.  5. She does not have hypertension.   PAST MEDICAL HISTORY:  1. She has problems with chronic bronchitis.  2. She has had several significant pulmonary infections.  3. She has hammer toes, operated on in 2001.   She is on no medications.  She takes ibuprofen p.r.n.   REVIEW OF SYSTEMS:  History of allergies and hay-fever, asthma,  emphysema, arthritis, and tyroid disease apparently.  She is not on any  Synthroid or thyroid replacement.   PHYSICAL EXAMINATION:  GENERAL:  She is a petite lady in no acute  distress.  VITAL SIGNS:  Respiratory rate is 18 and unlabored.  She is 5 feet 1  inch, weighs 107  pounds.  Her blood pressure is 102/60, pulse if 76 and  regular.  Her electrocardiogram is normal except for a borderline short  PR interval.  HEENT:  Normocephalic atraumatic.  Extraocular movements intact.  Sclerae are clear.  Facial symmetry is normal.  NECK:  Supple.  Carotid upstrokes are equal bilaterally without bruits.  No JVD.  Thyroid is not enlarged.  Trachea is midline.  LUNGS:  Reveal inspiratory/expiratory rhonchi, decreased breath sounds  throughout.  HEART:  Reveals a nondisplaced PMI.  She has normal S1 S2.  ABDOMEN:  Soft.  Good bowel sounds.  EXTREMITIES:  Reveals no cyanosis, clubbing, or edema.  Pulses are  intact.  NEUROLOGIC:  Intact.   ASSESSMENT/PLAN:  [name] has new findings on a stress EKG.  They are  not definitive but they certainly are suggestive of some coronary  ischemia.  Her only symptom of concern  is dyspnea on exertion which is  probably more related to her chronic obstructive pulmonary disease and  chronic bronchitis.  We have had a long talk about that today.  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  I do not think we need to go as far as  a catheterization at this point.  She is going to get back with [place] as  soon as she can make this part of her schedule.     Thomas C. Daleen Squibb, MD, [facility]  Electronically Signed    TCW/MedQ  DD: 04/09/2007  DT: 04/11/2007  Job #: [id number]   cc:   Marne A. Milinda Antis, MD

## 2010-11-05 NOTE — Assessment & Plan Note (Signed)
[name], Beck NO.:  [id number]   MEDICAL RECORD NO.:  [id number]          PATIENT TYPE:  POB   LOCATION:  [facility] at [facility]         FACILITY:  [facility]   PHYSICIAN:  Allie Bossier, MD        DATE OF BIRTH:  1960-09-30   DATE OF SERVICE:  03/20/2009                                  CLINIC NOTE   HISTORY OF PRESENT ILLNESS:  Ms. [name] is a 50 year old married white  gravida 3, para 3, who comes in here for an annual exam.  Her only  complaint is that of having a very heavy period 2 weeks ago, this is the  first cycle that she has had since 2007.  She does complain of  occasional vaginal dryness with intercourse.   PAST MEDICAL HISTORY:  Hypothyroidism (she sees Dr. Milinda Antis and says that  her TSH was normal recently).  She also has neck pain and sees Dr.  Dallas Schimke.   REVIEW OF SYSTEMS:  She has been married for 6 years.  She works at [employer], Mining engineer.  The remainder of the review of systems and  questions are negative.  Please note her Pap smear and mammogram have  not been done since 2007.   FAMILY HISTORY:  Negative for breast, GYN, and colon malignancy.   ALLERGIES:  No known drug allergies.   SOCIAL HISTORY:  She smokes half pack a day for the last 16 years.  Drinks occasionally and denies drugs.  She does report she has about 5  caffeinated beverages a day.   MEDICATIONS:  Synthroid 100 mcg every other day, 50 mcg every other day,  and she takes some unidentified pain medicines for her neck.  She says  she will call in with the name of it.   PHYSICAL EXAMINATION:  VITAL SIGNS:  Weight 110, height 5 feet 1 inch,  blood pressure 115/73, and pulse 76.  HEENT:  Normal.  BREASTS:  Normal bilaterally.  ABDOMEN:  Benign scaphoid.  No hepatosplenomegaly.  EXTERNAL GENITALIA:  Shaved.  No lesions.  Cervix, normal discharge, no  lesions.  Uterus is normal size and shape and mobile and midplane,  however, she does seem to be in some discomfort  with a bimanual exam.  Her ovaries are small, but her entire pelvic exam elicits expressions of  discomfort on her face.   ASSESSMENT AND PLAN:  1. Annual exam.  I have checked Pap smear and will schedule a      mammogram.  Recommended self-breast and self-vulvar exams monthly.  2. Postmenopausal bleeding.  I will get an ultrasound to determine the      endometrial stripe with she will probably      need some form of endometrial sampling (endometrial biopsy versus D      and C/hysteroscopy).  She will follow up in 1-2 weeks (after her      ultrasound).      Allie Bossier, MD     MCD/MEDQ  D:  03/20/2009  T:  03/21/2009  Job:  [id number]

## 2010-11-05 NOTE — Assessment & Plan Note (Signed)
Lauren Beck, ARANAS NO.:  [id number]   MEDICAL RECORD NO.:  [id number]          PATIENT TYPE:  POB   LOCATION:  [facility] at [facility]         FACILITY:  [facility]   PHYSICIAN:  Allie Bossier, MD        DATE OF BIRTH:  01-22-1961   DATE OF SERVICE:                                  CLINIC NOTE   Kyri is a 50 year old married white gravida 3, para 3, who is seen  here for her annual exam on March 20, 2009.  She complained of an  occasion of postmenopausal bleeding on March 06, 2009.  The workup  for that has been thorough.  She has had an ultrasound that showed her  endometrial lining to be 4.3 cm, otherwise normal ultrasound.  She had  an endometrial biopsy done which showed no evidence of atypia or  malignancy.  Her Pap smear did come back as atypical squamous cells of  undetermined significance, but the HPV was negative, so she will need  her next Pap smear in a year.  Her complaint today is that of pelvic  pain.  She says that since March 06, 2009, when she had the occasion  of postmenopausal bleeding, she has been experiencing intermittent  (about once a week) right lower quadrant pain.  This pain can be on a  scale of 3/10 up to a 10/10.  She takes some ibuprofen and it helps  sometimes.  She says that is not related to bowel or bladder function,  but can sometimes be provoked with intercourse.   On physical exam, her abdomen is scaphoid.  I cannot elicit the pain  that she is describing by deep palpation.  I have explained to her that  I do not think that this is a life-threatening condition.  However, I am  unable to explain it at this moment.  I have offered her watchful  waiting versus continued workup, in the form of a CT scan.  She is  adamant that she needs to know why she is hurting, so I am ordering a  CT of her abdomen and pelvis with and without contrast.  I am also going  to get a CBC and CMET.      Allie Bossier, MD     MCD/MEDQ  D:   05/09/2009  T:  05/10/2009  Job:  [id number]

## 2010-11-08 NOTE — H&P (Signed)
[facility]  Patient:    Lauren Beck, Lauren Beck         MRN: [id number] Adm. Date:  [id number] Attending:  Ephriam Knuckles H                         History and Physical  HISTORY:  This is a 50 year old white female who was admitted through the emergency room for treatment of pneumonia.  She has a two week history of head and chest congestion with cough and a one day history of headache, chest congestion, cough, fever, and chills.  She was brought to the emergency room where evaluation showed PO2 of 67.9.  Chest x-ray was negative; however, CT scan showed right middle lobe pneumonia.  Her white blood count was 17.5.  She was admitted for further treatment of pneumonia.  PAST MEDICAL HISTORY:  Significant for allergic rhinitis as well as hypothyroidism.  She did stop taking her thyroid medications approximately six months ago when she ran out and did not get them refilled.  No hospitalizations.  SOCIAL HISTORY:  She just moved back to this area.  At this time she is not working.  She does smoke two packs per day.  ALLERGIES:  No known drug allergies.  REVIEW OF SYSTEMS:  Negative except as above.  PHYSICAL EXAMINATION  GENERAL:  Thin female in no distress.  HEENT:  Tympanic membranes are clear.  Fundi not evaluated.  Throat is clear.  NECK:  Supple without adenopathy or thyromegaly.  CARDIAC:  Regular rhythm with no murmurs or gallops.  LUNGS:  Decreased right mid chest breath sounds.  ABDOMEN:  No hepatosplenomegaly, masses, or tenderness.  RECTAL:  Deferred at patient request.  GENITAL:  Deferred at patient request.  EXTREMITIES:  Pulses and reflexes 2+.  ASSESSMENT: 1. Right middle lobe pneumonia. 2. History of hypothyroidism. 3. Cigarette abuse.  PLAN:  Admit for antibiotics as well as O2 therapy.  Also will start her back on her thyroid medication. DD:  09/17/00 TD:  09/17/00 Job: [id number]

## 2010-11-08 NOTE — Discharge Summary (Signed)
[facility]  Patient:    Lauren Beck, Lauren Beck         MRN: [id number] Adm. Date:  [id number] Disc. Date: [id number] Attending:  Carollee Herter CC:         Ronnald Nian, M.D.   Discharge Summary  FINAL DIAGNOSES: 1. Right middle lobe pneumonia. 2. Hypoxemia secondary to #1. 3. Chronic obstructive pulmonary disease. 4. Bilateral hilar adenopathy. 5. Hypothyroidism.  PROCEDURES:  CT of the chest.  DISCHARGE MEDICATIONS: 1. Levaquin 500 mg daily for 14 days. 2. Fenesin 600 mg b.i.d. for 10 days. 3. Hycodan 8 oz. 1 tspn p.o. q.6h. p.r.n. cough. 4. Terazol 7 vaginal cream 1 applicator full in vagina q.h.s. x 7 days if    needed for Candida vaginitis secondary to antibiotic therapy. 5. Synthroid 0.15 mg daily #30 no refill.  DISPOSITION:  The patient is to remain out of work until recheck by Dr. Sharlot Gowda, Monday, April 8.  She is to call next week for an appointment to be seen on that date.  Note provided for her to be out of work all next week through September 28, 2000.  BRIEF HISTORY:  This 50 year old white female was admitted on September 17, 2000, by Dr. Susann Givens, after presenting to the emergency department with history of URI symptoms for two weeks.  She had a one day history of headache, chest congestion, cough, fever, and chills.  She had a PO2 on room air on admission of 67.9.  Her chest x-ray was negative; however, a CT scan of the chest showed right middle lobe pneumonia and no pulmonary emboli.  White blood cell count was 17,500, and she was admitted to the hospital for further evaluation and treatment, mainly because of hypoxemia and malaise.  PAST MEDICAL HISTORY:  History of hypothyroidism, previously treated with Synthroid 0.15 mg daily. She ran out of her thyroid medications some six months ago and did not get it refilled.  She also has a history of allergic rhinitis and has been out of that medication too.  No prior  hospitalizations.  SOCIAL HISTORY:  She is a native of [place].  She just moved back to this area and is employed by the [employer].  The patient admits to only smoking 1/2 pack of cigarettes daily but apparently has smoked more in the past and has smoked more in the past and has smoked since the age of 26. Chest CT done yesterday for evaluation shows small perihilar nodes bilaterally and blebs in the upper lung fields consistent with early emphysema.  These are multiple subpleural blebs throughout both lungs.  The largest hilar lymph node measures 13 mm on the right.  Pulmonary arteries are normal.  No pleural effusions noted.  There was linear atelectasis at the posterior lung bases. White blood cell count on admission was 17,500.  Blood cultures were obtained x 2 and so far are negative.  She did submit sputum for culture and sensitivity, but that result is pending as well.  BMET was within normal limits.  HOSPITAL COURSE:  The patient was admitted to a regular medical floor and was given initially Tequin 400 mg IV in the emergency department and then placed on Tequin 400 mg p.o. daily.  She was given oxygen, as ordered by Dr. Susann Givens via nasal cannula to keep O2 saturations greater than 92%.  She was treated with IV fluids consisting of D-5 one-half normal saline at 125 cc per hour, was given Motrin  400 mg q.4h. as needed for pleuritic pain or fever.  She was placed on a regular diet.  Activity was not restricted.  Initially, when she was admitted, she had a low-grade fever of 100.4 degrees orally.  Soon after admission, she defervesced and remained afebrile throughout the remained of her hospital course.  She was given a nebulizer treatment in the emergency department.  Thus, on the day of discharge, around 3:30 p.m., I saw her.  She said she was bored, feeling much better, and wanted to go home.  She had plans to go to the beach next week but has been advised to  take it easy, not expose herself to the sun while Levaquin very much without wearing sunscreen, is to drink plenty of fluids, take it easy, and not overdo it.  I explained to her that it would take at least two weeks to recover from pneumonia, and she might continue with the pleuritic chest pain, malaise, and fatigue.  She needed to allow herself plenty of time to rest and heal.  She will remain out of work until Monday, April 8, at which time she will see Dr. Susann Givens for reevaluation, and he will decide when she might return to work.  A note was given to that effect to take to her work.  She was advised to take Levaquin for the entire 14 days.  She was reminded not to run out of thyroid medication.  She was reminded that it was important that her thyroid be maintained at optimal level and that did not allow her to run out of her medication for months at a time.  Since she only admits to smoking 1/2 pack of cigarettes daily, I did not think she needed any type of medication for smoking cessation.  She was advised merely to either cut it out at this point in time, which is a good time to stop, or cut back gradually over several weeks.  She seems to understand that she does have changes of emphysema on her chest x-ray and needs to stop smoking before that process worsens.  She also was told that she has small bilateral hilar lymph nodes, and that her chest CT needs to be repeated in 3-4 months.  Also, I would advise her to have a Pneumovax vaccine when she has recovered from this pneumonia.  She says that she has had two out of three hepatitis vaccines and wants to get the third one in the near future, but told her she needed probably to get the pneumonia resolved first.  She will need follow-up with hypothyroidism with repeat TSH in approximately six weeks once she has had enough medication to allow it to correct itself.  Discharge medications are given at the beginning of this summary.   The patient was advised to take all medications as directed and to complete the course of  p.o. antibiotics.  If she becomes worse, she was instructed to call Dr. Susann Givens or whoever is on call for him immediately.  Otherwise follow-up will be on September 28, 2000.  She is discharged home on room air (no oxygen ordered). She seems to be stable at this point in time and feeling substantially better. DD:  09/18/00 TD:  09/20/00 Job: [id number]

## 2010-11-08 NOTE — Group Therapy Note (Signed)
NAMEKYRSTEN, DELEEUW NO.:  [id number]   MEDICAL RECORD NO.:  [id number]          PATIENT TYPE:  WOC   LOCATION:  [facility]                   FACILITY:  [facility]   PHYSICIAN:  Elsie Lincoln, MD      DATE OF BIRTH:  Sep 28, 1960   DATE OF SERVICE:                                    CLINIC NOTE   HISTORY OF PRESENT ILLNESS:  Lauren Beck is a 50 year old female who was  sent to me for pelvic pain.  She had a normal ultrasound, and the pain was  left-sided, and I do believe it is constipation.  I started her on Colace,  and this has helped her pain tremendously.  We also talked to her about  taking Senokot if the docusate sodium starts to not work.  She is also found  to be in menopause with an FSH of 109.  Now that she has had no periods for  a year, if she even has postmenopausal bleeding, she is to come see me.  She  is up to date on Pap smear and mammogram per the patient.  These were done  by Dr. Milinda Antis.  It sounds like she is in need of guaiac home stool kits, and  I will defer to Dr. Milinda Antis to follow up on these.  The patient did have some  questions about herpes simplex virus and cold sores.  We had a detailed  conversation about transmission and reactivation.  The patient now has full  understanding of this.  Her husband has one, and she just wanted to make  sure that there was infidelity occurring.  The patient was warned not to  kiss him or have oral sex while he has an active lesion.   In summary, the patient is to come back to me p.r.n., and will get most of  her follow up care with Dr. Milinda Antis.           ______________________________  Elsie Lincoln, MD     KL/MEDQ  D:  12/04/2005  T:  12/04/2005  Job:  [id number]   cc:   Dr. Trula Slade

## 2010-11-08 NOTE — Group Therapy Note (Signed)
NAMEINEZ, STANTZ NO.:  [id number]   MEDICAL RECORD NO.:  [id number]          PATIENT TYPE:  WOC   LOCATION[facility]  [facility]                   FACILITY:  [facility]   PHYSICIAN:  Elsie Lincoln, MD      DATE OF BIRTH:  [birth year]   DATE OF SERVICE:  11/13/2005                                    CLINIC NOTE   HISTORY:  The patient is a 50 year old G4, P3-0-1-3, LMP one year ago.  The  patient was sent from Dr. Lucretia Roers office for evaluation of chronic pelvic  pain.  The patient has been experiencing pain for approximately year and a  half with intercourse.  On the patient's yearly exam recently, she was found  to have left lower quadrant pain and that is when she was sent to me.  The  patient states she has no pain with bowel movements or urination, just pain  with intercourse, especially when the patient is on top with intercourse.  The patient does state she has constipation and definitely does skip some  days where she does not have a bowel movement, and she does say her bowel  movement is hard at times, like pellets.  She also is curious as to why she  has not had a period a year ago.  She does not correlate this with  menopause.  She also says she is interested in having another child.  She  also asked a question about her miscarriage she had many years ago and  wanted to know if I could figure out why the miscarriage happened, as she  said she had no bleeding with the miscarriage.  I explained that I could not  explain the miscarriage that happened many years ago.   PAST MEDICAL HISTORY:  Hypothyroidism.   PAST SURGICAL HISTORY:  Bilateral hammertoe repair, also the patient had  surgery by a gynecologist in [facility] for questionable left ovarian cyst,  but she said that she really did not have a cyst, but only ovulated.  She  could not give any more details than this.   MEDICATIONS:  Synthroid.   SOCIAL HISTORY:  Half pack to pack per day of tobacco, occasional  alcohol.   OBSTETRIC HISTORY:  NSVD x3, and SVD x1.  Denies all sexually transmitted  diseases, abnormal Pap smears, fibroid tumors of the uterus.   ALLERGIES:  None.   REVIEW OF SYMPTOMS:  Positive for cramps in legs, pain with intercourse, and  problems with shortness of breath.   PHYSICAL EXAMINATION:  GENERAL:  Well-developed, no apparent distress.  ABDOMEN:  Soft, nontender, nondistended, no rebound, no guarding, no hernia,  no organomegaly.  GENITALIA:  Tanner 5 , vagina pink, maybe slightly atrophic.  Urethra no  prolapse, urethra nontender.  Bladder, good support, nontender.  Cervix  nontender, uterus anteverted, small, nontender.  Left adnexa no mass;  however, tender all in left lower quadrant between hands held over adnexa, a  small ovary felt and nontender.  Right adnexa no masses nontender.  In view  that the pain is deep and diffuse on the left, this pain could possibly  be  constipation.   ASSESSMENT/PLAN:  A 50 year old female with questionable early menopause and  left lower quadrant pain.  1.  Transvaginal ultrasound to rule out pelvic pathology.  2.  Docusate sodium twice day to have regular bowel movements.  3.  Return to clinic in three weeks to evaluate pain after regular bowel      movements and go over ultrasound.           ______________________________  Elsie Lincoln, MD     KL/MEDQ  D:  11/13/2005  T:  11/13/2005  Job:  [id number]

## 2010-11-08 NOTE — Op Note (Signed)
[facility]  Patient:    Lauren Beck, Lauren Beck Visit Number: [id number] MRN: [id number]          Service Type: DSU Location: DAY Attending Physician:  Marin Comment Proc. Date: 04/19/01 Admit Date:  04/19/2001                             Operative Report  PREOPERATIVE DIAGNOSES:  Persistent left lower quadrant pain, hydrosalpinx, and an abnormal left ovary on sonogram consistent with a complex cyst.  POSTOPERATIVE DIAGNOSES:  Bilateral Falope ring, tubal ligation, solitary adhesion of the descending colon to the left sidewall, corpus luteum cyst on the left ovary.  No evidence of endometriosis.  Dye extends to the uterus and can be seen in both proximal fallopian tubes.  PROCEDURE:  Diagnostic laparoscopy, chromotubation, lysis of adhesions  ANESTHESIA:  General endotracheal.  SURGEON:  Pershing Cox, M.D.  ASSISTANT:  Genia Del, M.D.  INDICATIONS:  [name] was seen in the emergency room on August 17 by Dr. Donnetta Hutching.  She was complaining of left-sided pelvic pain. Sonogram was performed which showed a complex multiseptated cystic lesion in the left adnexa and an adjacent hydrosalpinx.  The x-ray was suggestive of a dermoid cyst.  She was subsequently seen in my office for examination on August 22.  She is a very slender patient.  She had had a tubal ligation in the past.  She had very irregular menstrual periods.  In fact, she said that her last menstrual period prior to the one in August was approximately a year ago.  She has coitus without protection and very much desires pregnancy. Laboratory data were obtained which showed a prolactin of 7.7 which is normal, a TSH of 4.96 which is normal but an FSH of 85.  With this information, the patient was counseled.  Her pain persisted, and she requested diagnostic laparoscopy.  Sonogram was performed in my office on 04/06/01, and that sonogram showed a left  hydrosalpinx.  It was a small left hydrosalpinx.  There was a multiseptic septated ovarian cyst with a small echogenic area, but the ovary was of normal size.  With that information, the patient is brought to the operating room today for a diagnostic laparoscopy.  FINDINGS:  The uterus is small, approximately six weeks in size.  It is anteflexed.  On laparoscopic examination, there was no evidence of endometriosis.  There was bilateral Falope ring tubal ligation with the distal and proximal tubes separated by a space of approximately 1 cm.  There is a corpus luteum cyst on the left ovary which otherwise looks normal.  There was an adhesion of the descending colon to the left pelvic sidewall which was lysed in hopes that this might relieve her symptoms.  There was no evidence of endometriosis.  OPERATIVE PROCEDURE:  [name] was brought to the operating room with an IV in place.  She had received 1 g of Ancef in the holding area.  Supine on the OR table, general endotracheal anesthesia was administered, and then she was then placed into Allen stirrups.  In the stirrups, I was able to perform exam under anesthesia and then prep her anterior abdominal wall, perineum, and vagina with Hibiclens.  Bivalve speculum was inserted into the vagina, and the cervix was visualized.  Single-tooth tenaculum was placed onto the anterior cervix and the uterine sound passed to a depth of 7 cm.  Kevorkian curette was introduced  into the uterine cavity, and curettings of the endometrium were taken because of the patients fluid in her uterine cavity which had been noted on sonogram.  There was a scant amount of tissue which was sent for endometrial sampling.  The laparoscopy began by everting the umbilicus.  A 1 cm incision was made at the base of the umbilicus extending toward the patients feet.  Skin edges were separated.  The fascia was lifted and incised.  It was then held with Kocher clamps as  the underlying peritoneum was identified and opened.  A 0 Vicryl suture was made as a pursestring around the fascial edges, and the Hasson cannula was inserted and secured.  The diagnostic laparoscope was used to visualize the pelvis after CO2 gas was used to obtain a pneumoperitoneum. It was impossible to fully see the ovary, and therefore, a 5 mm port was placed under direct visualization in the midline.  Through this port, a probe was placed, and the ovary and fallopian tube could actually be lifted and seen clearly.  Previously-noted Falope rings were photographed.  The tubes were separated in their proximal and distal ends by approximately 1 cm distance. The ovary was very carefully inspected, and photographs were taken.  There was clearly a stigmata on the top of a corpus luteum cyst in an otherwise normal-appearing ovary.  Photographs were taken of the remainder of the abdominal cavity.  There was some adhesive disease to the right upper quadrant but nothing in the pelvis to suggest endometriosis or any sign of infection.  After the photographs had been completed, we again looked at the left sidewall to see if there was any possibility of the cause for the patients pain.  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  Straight scissors were used to incise this solitary adhesion.  There was no bleeding from this site or from the pelvis.  At this point, a cannula was placed into the cervix and using methylene blue dye, the fallopian tubes and uterus were studied.  The dye came into the fundus such that it could be seen in the lymphatics.  The proximal tube was not distended with dye, but a slight dye flush could be seen through the tubes.  There was clearly no spill of dye into the pelvis.  The 5 mm midline port was removed under direct visualization.  The Hasson cannula was removed, and the gas  was evacuated from the abdominal cavity.  The periumbilical incision was closed by tying the pursestring.  A single 4-0 Vicryl was used to bring the skin edges together below the umbilicus.  Dermabond was used to close both of the skin ports.  Estimated blood loss less than 5 cc.  Complications none.  Specimens none. Attending Physician:  Marin Comment DD:  04/19/01 TD:  04/20/01 Job: [id number]

## 2011-04-01 LAB — CULTURE, BLOOD (ROUTINE X 2): Culture: NO GROWTH

## 2011-04-01 LAB — BASIC METABOLIC PANEL
CO2: 23
Calcium: 8.5
Creatinine, Ser: 0.74
GFR calc Af Amer: >60
GFR calc non Af Amer: >60
Glucose, Bld: 122 — ABNORMAL HIGH
Sodium: 135

## 2011-04-01 LAB — URINALYSIS, ROUTINE W REFLEX MICROSCOPIC
Bilirubin Urine: NEGATIVE
Nitrite: NEGATIVE
Protein, ur: 30 — AB
Specific Gravity, Urine: 1.024
Urobilinogen, UA: 1

## 2011-04-01 LAB — DIFFERENTIAL
Basophils Absolute: 0
Basophils Relative: 0
Lymphocytes Relative: 6 — ABNORMAL LOW
Monocytes Absolute: 0.6
Neutro Abs: 12.3 — ABNORMAL HIGH
Neutrophils Relative %: 90 — ABNORMAL HIGH

## 2011-04-01 LAB — CBC
Hemoglobin: 12.1
MCHC: 34.5
RDW: 13

## 2011-04-01 LAB — URINE MICROSCOPIC-ADD ON

## 2011-04-01 LAB — PREGNANCY, URINE: Preg Test, Ur: NEGATIVE

## 2011-06-09 ENCOUNTER — Encounter: Payer: Self-pay | Admitting: Family Medicine

## 2011-06-10 ENCOUNTER — Encounter: Payer: Self-pay | Admitting: Family Medicine

## 2011-06-10 ENCOUNTER — Ambulatory Visit (INDEPENDENT_AMBULATORY_CARE_PROVIDER_SITE_OTHER): Payer: Self-pay | Admitting: Family Medicine

## 2011-06-10 VITALS — BP 104/62 | HR 76 | Temp 97.9°F | Ht 61.0 in | Wt 123.5 lb

## 2011-06-10 DIAGNOSIS — F17200 Nicotine dependence, unspecified, uncomplicated: Secondary | ICD-10-CM

## 2011-06-10 DIAGNOSIS — E039 Hypothyroidism, unspecified: Secondary | ICD-10-CM

## 2011-06-10 DIAGNOSIS — M542 Cervicalgia: Secondary | ICD-10-CM

## 2011-06-10 MED ORDER — CYCLOBENZAPRINE HCL 10 MG PO TABS: 10.0000 mg | ORAL_TABLET | Freq: Three times a day (TID) | ORAL | Status: AC | PRN

## 2011-06-10 MED ORDER — MELOXICAM 15 MG PO TABS: 15.0000 mg | ORAL_TABLET | Freq: Every day | ORAL | Status: DC

## 2011-06-10 NOTE — Assessment & Plan Note (Signed)
Disc in detail risks of smoking and possible outcomes including copd, vascular/ heart disease, cancer , respiratory and sinus infections  Pt voices understanding  Pt had flu shot Not interested in quitting right now

## 2011-06-10 NOTE — Assessment & Plan Note (Signed)
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

## 2011-06-10 NOTE — Progress Notes (Signed)
Subjective:    Patient ID: Lauren Beck, female    DOB: 29-Dec-1960, 50 y.o.   MRN: [id number]  HPI Here for neck pain and ear problem Worse lately  Is a pressure/ crushing pain lately - bothers her at works and sometimes causes headaches  Even made her nauseated one time  One day was especially bad  Today is worse on the L side  Not radiating into hands - but does go into back  No numbness or weakness    Last cs x ray was in 8/10 showed C5-6 disc dz and also spurring / likely some spinal stenosis Has not been to a specialist for this due to no insurance (has not had for a very long time)  Did get her flu shot  Important given she still smokes Not interested in quitting  Has cut down No sob or wheeze  Due for thyroid check  Late due to lack of funds No missed doses Lab Results  Component Value Date   TSH 2.417 10/04/2010   no clinical changes Skin/ hair and energy level the same   Has ibuprofen- really not helping much any more --one px pill 800 mg - at night with food  Also zanaflex - muscle relaxer - not helping much   Vit D and fish oil are new   Also her L ear hurts- ? Ref pain from her neck  Patient Active Problem List  Diagnoses  . HYPOTHYROIDISM  . PURE HYPERCHOLESTEROLEMIA  . TOBACCO ABUSE  . REACTION, ADJUSTMENT NOS  . SINUSITIS - ACUTE-NOS  . ALLERGIC RHINITIS  . COPD  . EXCESSIVE OR FREQUENT MENSTRUATION  . IRREGULAR MENSES  . URTICARIA  . STRESS ELECTROCARDIOGRAM, ABNORMAL  . MIGRAINES, HX OF  . COUGH  . Neck pain   Past Medical History  Diagnosis Date  . Allergic rhinitis   . COPD (chronic obstructive pulmonary disease)   . Hypothyroidism   . Excessive or frequent menstruation   . History of migraines   . Pure hypercholesterolemia   . Unspecified adjustment reaction   . Electrocardiogram finding, abnormal, without diagnosis   . Tobacco use disorder    Past Surgical History  Procedure Date  . Hammer toe surgery   . Pelvic laparoscopy    . Tubal ligation    History  Substance Use Topics  . Smoking status: Current Everyday Smoker  . Smokeless tobacco: Not on file  . Alcohol Use: Not on file   Family History  Problem Relation Age of Onset  . Depression Mother     ?  Marland Kitchen Other Father     No contact--unknown   No Known Allergies Current Outpatient Prescriptions on File Prior to Visit  Medication Sig Dispense Refill  . levothyroxine (SYNTHROID, LEVOTHROID) 88 MCG tablet Take 88 mcg by mouth daily.        Marland Kitchen albuterol (PROVENTIL HFA;VENTOLIN HFA) 108 (90 BASE) MCG/ACT inhaler Inhale 2 puffs into the lungs every 4 (four) hours as needed.        . fexofenadine (ALLEGRA) 180 MG tablet Take 180 mg by mouth daily.        . fluticasone (FLONASE) 50 MCG/ACT nasal spray Place 2 sprays into the nose daily.        . Fluticasone-Salmeterol (ADVAIR) 250-50 MCG/DOSE AEPB Inhale 1 puff into the lungs every 12 (twelve) hours.           Review of Systems Review of Systems  Constitutional: Negative for fever, appetite change, fatigue and unexpected  weight change.  Eyes: Negative for pain and visual disturbance.  ENT neg for congestion or st or ear discharge Respiratory: Negative for cough and shortness of breath.   Cardiovascular: Negative for cp or palpitations    Gastrointestinal: Negative for nausea, diarrhea and constipation.  Genitourinary: Negative for urgency and frequency.  Skin: Negative for pallor or rash   MSK pos for neck and upper back pain Neurological: Negative for weakness, light-headedness, numbness and headaches.  Hematological: Negative for adenopathy. Does not bruise/bleed easily.  Psychiatric/Behavioral: Negative for dysphoric mood. The patient is not nervous/anxious.          Objective:   Physical Exam  Constitutional: She appears well-developed and well-nourished. No distress.  HENT:  Head: Normocephalic and atraumatic.  Mouth/Throat: Oropharynx is clear and moist.  Eyes: Conjunctivae and EOM are  normal. Pupils are equal, round, and reactive to light. No scleral icterus.  Neck: Normal range of motion. Neck supple. No JVD present. Carotid bruit is not present. No thyromegaly present.       Tender lower cervical spinous processes Tender occiput areas and trapezius muscles bilat  Flex full with pain  Extent 30 deg Pain to rotate and tilt R  Nl strength No crepitice   Cardiovascular: Normal rate, regular rhythm and normal heart sounds.  Exam reveals no gallop.   Pulmonary/Chest: Breath sounds normal. No respiratory distress. She has no wheezes.       Diffusely distant bs   Musculoskeletal: She exhibits tenderness. She exhibits no edema.  Lymphadenopathy:    She has no cervical adenopathy.  Neurological: She is alert. She has normal strength and normal reflexes. She displays no atrophy and no tremor. No cranial nerve deficit or sensory deficit. She exhibits normal muscle tone. Coordination normal.  Skin: Skin is dry. No rash noted.  Psychiatric: She has a normal mood and affect.          Assessment & Plan:

## 2011-06-10 NOTE — Patient Instructions (Addendum)
Find out the name of the cream you tried at work that helped Keep using heat for muscle tension Stop the ibuprofen and try the meloxicam with food  Instead of zanaflex - try flexeril - careful of sedation When you are ready or if worsens - we will need to refer you to orthopedics - so let me know  Keep thinking about quitting smoking  Thyroid labs today

## 2011-06-10 NOTE — Assessment & Plan Note (Signed)
Pt is overdue for labs - due to lack of insurance No missed doses No clinical changes  tsh today and adv

## 2011-06-25 ENCOUNTER — Telehealth: Payer: Self-pay | Admitting: Internal Medicine

## 2011-06-25 MED ORDER — LEVOTHYROXINE SODIUM 100 MCG PO TABS: 100.0000 ug | ORAL_TABLET | Freq: Every day | ORAL | Status: DC

## 2011-06-25 NOTE — Telephone Encounter (Signed)
Patient said she takes of Synthroid

## 2011-06-25 NOTE — Telephone Encounter (Signed)
Message copied by Virgina Evener on Wed Jun 25, 2011  3:50 PM ------      Message from: Patience Musca      Created: Wed Jun 25, 2011  2:06 PM       Left v/m for pt to call back.

## 2011-06-25 NOTE — Telephone Encounter (Signed)
I want to increase synthroid to 100 mcg and then re check tsh for 244.9 in 6 weeks please Px written for call in  (did not know what pharmacy) Please try not to miss doses

## 2011-06-25 NOTE — Telephone Encounter (Signed)
Patient notified as instructed by telephone. Medication phoned to [facility] [address] pharmacy as instructed.Lab appt scheduled as instructed 08/11/11.

## 2011-08-06 ENCOUNTER — Other Ambulatory Visit: Payer: Self-pay | Admitting: Family Medicine

## 2011-08-06 DIAGNOSIS — E039 Hypothyroidism, unspecified: Secondary | ICD-10-CM

## 2011-08-11 ENCOUNTER — Other Ambulatory Visit (INDEPENDENT_AMBULATORY_CARE_PROVIDER_SITE_OTHER): Payer: Self-pay

## 2011-08-11 DIAGNOSIS — E039 Hypothyroidism, unspecified: Secondary | ICD-10-CM

## 2011-08-12 LAB — TSH: TSH: 0.78 u[IU]/mL (ref 0.35–5.50)

## 2011-08-22 ENCOUNTER — Other Ambulatory Visit: Payer: Self-pay | Admitting: Family Medicine

## 2011-08-22 NOTE — Telephone Encounter (Signed)
Electronic refill request

## 2011-08-22 NOTE — Telephone Encounter (Signed)
Will refill electronically  

## 2011-09-01 ENCOUNTER — Ambulatory Visit (INDEPENDENT_AMBULATORY_CARE_PROVIDER_SITE_OTHER): Payer: Self-pay | Admitting: Family Medicine

## 2011-09-01 ENCOUNTER — Encounter: Payer: Self-pay | Admitting: Family Medicine

## 2011-09-01 VITALS — BP 110/62 | HR 88 | Temp 97.6°F | Ht 61.0 in | Wt 123.2 lb

## 2011-09-01 DIAGNOSIS — M542 Cervicalgia: Secondary | ICD-10-CM

## 2011-09-01 MED ORDER — HYDROCODONE-ACETAMINOPHEN 5-500 MG PO TABS: 1.0000 | ORAL_TABLET | Freq: Three times a day (TID) | ORAL | Status: AC | PRN

## 2011-09-01 NOTE — Progress Notes (Signed)
Subjective:    Patient ID: Lauren Beck, female    DOB: 1960/12/28, 51 y.o.   MRN: [id number]  HPI Here for neck pain (that is chronic )  C5- C6 deg changes with spurs , spasm   Still taking mobic and flexeril   Is now concerned because she has more pain for 5 days  Really bad pain in neck -- like bones are popping out  Now pain in upper R arm  No numbness or weakness  Now really hurts - but if she stays still that helps Hurts to turn head to the right   Patient Active Problem List  Diagnoses  . HYPOTHYROIDISM  . PURE HYPERCHOLESTEROLEMIA  . TOBACCO ABUSE  . REACTION, ADJUSTMENT NOS  . SINUSITIS - ACUTE-NOS  . ALLERGIC RHINITIS  . COPD  . EXCESSIVE OR FREQUENT MENSTRUATION  . IRREGULAR MENSES  . URTICARIA  . STRESS ELECTROCARDIOGRAM, ABNORMAL  . MIGRAINES, HX OF  . COUGH  . Neck pain   Past Medical History  Diagnosis Date  . Allergic rhinitis   . COPD (chronic obstructive pulmonary disease)   . Hypothyroidism   . Excessive or frequent menstruation   . History of migraines   . Pure hypercholesterolemia   . Unspecified adjustment reaction   . Nonspecific abnormal electrocardiogram (ECG) (EKG)   . Tobacco use disorder    Past Surgical History  Procedure Date  . Hammer toe surgery   . Pelvic laparoscopy   . Tubal ligation    History  Substance Use Topics  . Smoking status: Current Everyday Smoker  . Smokeless tobacco: Not on file  . Alcohol Use: Not on file   Family History  Problem Relation Age of Onset  . Depression Mother     ?  Marland Kitchen Other Father     No contact--unknown   No Known Allergies Current Outpatient Prescriptions on File Prior to Visit  Medication Sig Dispense Refill  . Cholecalciferol (VITAMIN D) 1000 UNITS capsule Take 1,000 Units by mouth daily.        . fexofenadine (ALLEGRA) 180 MG tablet Take 180 mg by mouth daily.        Marland Kitchen ibuprofen (ADVIL,MOTRIN) 800 MG tablet TAKE ONE TABLET BY MOUTH THREE TIMES DAILY WITH FOOD AS NEEDED NECK   PAIN/HEADACHE  60 tablet  3  . levothyroxine (SYNTHROID, LEVOTHROID) 100 MCG tablet Take 1 tablet (100 mcg total) by mouth daily.  30 tablet  5  . meloxicam (MOBIC) 15 MG tablet Take 1 tablet (15 mg total) by mouth daily.  30 tablet  5  . Omega-3 Fatty Acids (FISH OIL) 1000 MG CAPS Take 1 capsule by mouth daily.        Marland Kitchen albuterol (PROVENTIL HFA;VENTOLIN HFA) 108 (90 BASE) MCG/ACT inhaler Inhale 2 puffs into the lungs every 4 (four) hours as needed.        . fluticasone (FLONASE) 50 MCG/ACT nasal spray Place 2 sprays into the nose daily.        . Fluticasone-Salmeterol (ADVAIR) 250-50 MCG/DOSE AEPB Inhale 1 puff into the lungs every 12 (twelve) hours.            Review of Systems Review of Systems  Constitutional: Negative for fever, appetite change, fatigue and unexpected weight change.  Eyes: Negative for pain and visual disturbance.  Respiratory: Negative for cough and shortness of breath.   Cardiovascular: Negative for cp or palpitations    Gastrointestinal: Negative for nausea, diarrhea and constipation.  Genitourinary: Negative for urgency and frequency.  Skin: Negative for pallor or rash   MSK pos for neck and arm pain, neg for joint swelling or redness Neurological: Negative for weakness, light-headedness, numbness and headaches.  Hematological: Negative for adenopathy. Does not bruise/bleed easily.  Psychiatric/Behavioral: Negative for dysphoric mood. The patient is not nervous/anxious.         Objective:   Physical Exam  Constitutional: She appears well-developed and well-nourished. No distress.  HENT:  Head: Normocephalic and atraumatic.  Mouth/Throat: Oropharynx is clear and moist.  Eyes: Conjunctivae and EOM are normal.  Neck: Neck supple. Spinous process tenderness and muscular tenderness present. No tracheal tenderness present. Carotid bruit is not present. No rigidity. Decreased range of motion present. No edema and no erythema present.       Tender over lowerCS and  also R paracervical musculature  Pain to turn and tilt head R   Cardiovascular: Normal rate and regular rhythm.   Pulmonary/Chest: Breath sounds normal.  Musculoskeletal: She exhibits tenderness. She exhibits no edema.       See neck exam Nl shoulder exam No arm tenderness Nl grip  Lymphadenopathy:    She has no cervical adenopathy.  Neurological: She is alert. She displays no atrophy. No cranial nerve deficit or sensory deficit. She exhibits normal muscle tone. Coordination normal.  Skin: Skin is warm and dry. No rash noted.  Psychiatric: She has a normal mood and affect.          Assessment & Plan:

## 2011-09-01 NOTE — Patient Instructions (Addendum)
Continue the meloxicam and flexeril  Use the vicodin for severe/ emergency pain only  We will do orthopedic referral at check out  Continue heat 10 minutes at a time

## 2011-09-01 NOTE — Assessment & Plan Note (Signed)
This is worse now with development of R arm pain -  ? Radiculopathy vs tendonitis Known C5//c6 disk dz with continued worsened pain/ spasm and inability to turn head R  Will continue flexeril and mobic (no other nsaids) Given #15 vicodin for emergencies only- disc habit forming potential  Ref to ortho- should be getting insurance soon

## 2011-10-15 ENCOUNTER — Telehealth: Payer: Self-pay | Admitting: Family Medicine

## 2011-10-15 NOTE — Telephone Encounter (Signed)
Thanks for making me aware of the situation and giving her the number for resources She may also want to look into [facility] in [place] or back door clinic in [place] for lower cost care  Keep me updated

## 2011-10-15 NOTE — Telephone Encounter (Signed)
Dr Milinda Antis , Patient has a collection balance at [employer] and has no health insurance at this time. I attempted to refer her to a  orthopedic in [place] Dr Romeo Apple hoping he could see her and help her with her pain. I faxed over office notes to them and they responded that he thought the patient needed to see a Neurosurgeon. Called patient to make her aware of the referral progress. She is in the beginning of splitting up with her husband. I gave her info to apply for help thru [facility]. She hasnt taken her synthroid in 3 weeks cause she cant afford it. She hopes to be able to fill her RX soon.

## 2011-12-26 ENCOUNTER — Encounter: Payer: Self-pay | Admitting: Family Medicine

## 2011-12-26 ENCOUNTER — Ambulatory Visit (INDEPENDENT_AMBULATORY_CARE_PROVIDER_SITE_OTHER): Payer: Self-pay | Admitting: Family Medicine

## 2011-12-26 VITALS — BP 131/76 | HR 83 | Temp 98.1°F | Ht 61.0 in | Wt 102.0 lb

## 2011-12-26 DIAGNOSIS — F439 Reaction to severe stress, unspecified: Secondary | ICD-10-CM | POA: Insufficient documentation

## 2011-12-26 DIAGNOSIS — E039 Hypothyroidism, unspecified: Secondary | ICD-10-CM

## 2011-12-26 DIAGNOSIS — R4589 Other symptoms and signs involving emotional state: Secondary | ICD-10-CM

## 2011-12-26 MED ORDER — CYCLOBENZAPRINE HCL 10 MG PO TABS: 10.0000 mg | ORAL_TABLET | Freq: Three times a day (TID) | ORAL | Status: AC | PRN

## 2011-12-26 MED ORDER — FLUTICASONE PROPIONATE 50 MCG/ACT NA SUSP: 2.0000 | Freq: Every day | NASAL | Status: DC

## 2011-12-26 MED ORDER — LEVOTHYROXINE SODIUM 100 MCG PO TABS: 100.0000 ug | ORAL_TABLET | Freq: Every day | ORAL | Status: DC

## 2011-12-26 MED ORDER — MIRTAZAPINE 15 MG PO TABS: 15.0000 mg | ORAL_TABLET | Freq: Every day | ORAL | Status: DC

## 2011-12-26 NOTE — Progress Notes (Signed)
Subjective:    Patient ID: Lauren Beck, female    DOB: 08-Nov-1960, 51 y.o.   MRN: [id number]  HPI Here for anxiety today  Her husband asked for a separation -- he is seeing someone else  Is getting evicted from her residence - and is being hassled by her landlord  She does have a lawyer  Is still working   Is feeling very anxious  Like she is crawling out of her skin  Also feels depressed and angry - wants to scream and yell  For a month she did not eat  Smoking more cigarettes due to the stress Also gets nauseated and gaggy (takes otc acid relief med)  Feels depressed too  Down , and tearful at times -- this comes and goes  Makes her have trouble concentrating  No suicidal thougts   Is eating a little better now  Is not sleeping at all - both trouble falling and staying asleep    She has already moved out  Lives with her daughter currently- looking for her own place (will need a 2nd job)  Work is fair    Has never had a time she felt like this-no hx of depression or anxiety  She is taking to her daughters- some good support there  She goes to a [employer] is down 21 lb since last visit with low bmi  Hypothyroid Lab Results  Component Value Date   TSH 0.78 08/11/2011   pt has no insurance at this time Needs refil on synthroid also    Patient Active Problem List  Diagnosis  . HYPOTHYROIDISM  . PURE HYPERCHOLESTEROLEMIA  . TOBACCO ABUSE  . REACTION, ADJUSTMENT NOS  . SINUSITIS - ACUTE-NOS  . ALLERGIC RHINITIS  . COPD  . EXCESSIVE OR FREQUENT MENSTRUATION  . IRREGULAR MENSES  . URTICARIA  . STRESS ELECTROCARDIOGRAM, ABNORMAL  . MIGRAINES, HX OF  . COUGH  . Neck pain   Past Medical History  Diagnosis Date  . Allergic rhinitis   . COPD (chronic obstructive pulmonary disease)   . Hypothyroidism   . Excessive or frequent menstruation   . History of migraines   . Pure hypercholesterolemia   . Unspecified adjustment reaction   . Nonspecific abnormal  electrocardiogram (ECG) (EKG)   . Tobacco use disorder    Past Surgical History  Procedure Date  . Hammer toe surgery   . Pelvic laparoscopy   . Tubal ligation    History  Substance Use Topics  . Smoking status: Current Everyday Smoker  . Smokeless tobacco: Not on file  . Alcohol Use: Yes   Family History  Problem Relation Age of Onset  . Depression Mother     ?  Marland Kitchen Other Father     No contact--unknown   No Known Allergies Current Outpatient Prescriptions on File Prior to Visit  Medication Sig Dispense Refill  . albuterol (PROVENTIL HFA;VENTOLIN HFA) 108 (90 BASE) MCG/ACT inhaler Inhale 2 puffs into the lungs every 4 (four) hours as needed.        . Cholecalciferol (VITAMIN D) 1000 UNITS capsule Take 1,000 Units by mouth daily.        . Cyclobenzaprine HCl (FLEXERIL PO) Take by mouth as directed.      . fexofenadine (ALLEGRA) 180 MG tablet Take 180 mg by mouth daily.        . Fluticasone-Salmeterol (ADVAIR) 250-50 MCG/DOSE AEPB Inhale 1 puff into the lungs every 12 (twelve) hours.        Marland Kitchen  levothyroxine (SYNTHROID, LEVOTHROID) 100 MCG tablet Take 1 tablet (100 mcg total) by mouth daily.  30 tablet  5  . meloxicam (MOBIC) 15 MG tablet Take 1 tablet (15 mg total) by mouth daily.  30 tablet  5  . Omega-3 Fatty Acids (FISH OIL) 1000 MG CAPS Take 1 capsule by mouth daily.        . fluticasone (FLONASE) 50 MCG/ACT nasal spray Place 2 sprays into the nose daily.        Marland Kitchen ibuprofen (ADVIL,MOTRIN) 800 MG tablet TAKE ONE TABLET BY MOUTH THREE TIMES DAILY WITH FOOD AS NEEDED NECK  PAIN/HEADACHE  60 tablet  3     Review of Systems Review of Systems  Constitutional: Negative for fever,   and unexpected weight change. pos for fatigue  Eyes: Negative for pain and visual disturbance.  Respiratory: Negative for cough and shortness of breath.   Cardiovascular: Negative for cp or palpitations    Gastrointestinal: Negative for nausea, diarrhea and constipation.  Genitourinary: Negative for  urgency and frequency.  Skin: Negative for pallor or rash   Neurological: Negative for weakness, light-headedness, numbness and headaches.  Hematological: Negative for adenopathy. Does not bruise/bleed easily.  Psychiatric/Behavioral: pos for anx and dep symptoms with tearfulness and sleep problems, no SI       Objective:   Physical Exam  Constitutional: She appears well-developed. No distress.       Underweight appearing anxious female  HENT:  Head: Normocephalic and atraumatic.  Right Ear: External ear normal.  Left Ear: External ear normal.  Nose: Nose normal.  Mouth/Throat: Oropharynx is clear and moist.  Eyes: Conjunctivae and EOM are normal. Pupils are equal, round, and reactive to light. No scleral icterus.  Neck: Normal range of motion. Neck supple. No JVD present. No thyromegaly present.  Cardiovascular: Normal rate, regular rhythm, normal heart sounds and intact distal pulses.  Exam reveals no gallop.   Pulmonary/Chest: Effort normal and breath sounds normal. No respiratory distress. She has no wheezes.  Abdominal: Soft. Bowel sounds are normal. She exhibits no distension and no mass. There is no tenderness.  Musculoskeletal: She exhibits no edema.  Lymphadenopathy:    She has no cervical adenopathy.  Neurological: She is alert. She has normal reflexes. No cranial nerve deficit. She exhibits normal muscle tone. Coordination normal.       No tremor   Skin: Skin is warm and dry. No rash noted. No erythema. No pallor.  Psychiatric: Her speech is normal and behavior is normal. Judgment and thought content normal. Her mood appears anxious. Her affect is not blunt and not labile. She is not slowed. Cognition and memory are normal. She exhibits a depressed mood. She expresses no homicidal and no suicidal ideation.       Anxious and tearful at times  Good comm skills and eye contact           Assessment & Plan:

## 2011-12-26 NOTE — Assessment & Plan Note (Signed)
Long disc about situational stressors- pt having problems with anx and depressive symptoms (no SI) Overall coping skills are fair (except for smoking) Warned not to drink alcohol  Given px for remeron 15 mg - to use qhs for mood and also sleep and appetite Disc opt for counseling- pastoral  Disc self care and nutrition Will update if side eff- rev in detail  F/u 4-6 wk

## 2011-12-26 NOTE — Patient Instructions (Addendum)
Look to your church for some pastoral counseling  Also get support from your family  Try to eat regular meals Start remeron (mirtazapine) 15 mg at bedtime-this will help with sleep /appetite/ anxiety / depression    (it will take a while to work for the depression but will help with sleep fairly quickly) Drink lots of fluids Avoid alcohol  Follow up with me in about 4-6 weeks If side effects or if you feel worse , please stop med and call

## 2011-12-26 NOTE — Assessment & Plan Note (Signed)
Refilled med today- last tsh theraputic

## 2011-12-30 ENCOUNTER — Telehealth: Payer: Self-pay | Admitting: Family Medicine

## 2011-12-30 NOTE — Telephone Encounter (Signed)
That is fine to go ahead and take the 1/2 dose , thanks for the update

## 2011-12-30 NOTE — Telephone Encounter (Signed)
Caller: Lauren Beck/Patient; PCP: Tower, Marne A.; CB#: [phone]; ; ; Call regarding Medication Question;   LMP- Menopausal. Patient states she is prescribed Mirtazapine 15mg . @ HS for anxiety. Patient states she has been taking 1/2 tablet due to  medication causing drowsiness. Patient states she started medication 12/27/11 at 1/2 the prescribed dosage. Patient states she is feeling "calmer". States she felt "very drained" 12/29/11 but feels "fine" 12/30/11. PATIENT STATES SHE IS CALLING TO VERIFY WITH DR. Milinda Antis THAT IT IS OK TO TAKE 1/2 DOSAGE OF MIRTAZAPINE. PATIENT CAN BE REACHED @ [phone].  Patient also states she developed increased flatus 12/28/11 after starting Mertazapine. Patient denies abdominal pain or bloating. States nrmmal, brown, soft BM 12/30/11. Denies urinary sx. Afebrile. States had nausea with small, clear emesis X 1 12/29/11 a.m. Denies nausea or vomiting 12/30/11. Triage per Abdominal Pain Protocol. No emergent sx identified. Care advice given per guidelines. Call back parameters reviewed. Patient verbalizes understanding.

## 2011-12-30 NOTE — Telephone Encounter (Signed)
Informed patient

## 2012-01-30 ENCOUNTER — Telehealth: Payer: Self-pay | Admitting: Family Medicine

## 2012-01-30 ENCOUNTER — Ambulatory Visit: Payer: Self-pay | Admitting: Family Medicine

## 2012-01-30 DIAGNOSIS — Z0289 Encounter for other administrative examinations: Secondary | ICD-10-CM

## 2012-02-04 ENCOUNTER — Telehealth: Payer: Self-pay | Admitting: Family Medicine

## 2012-02-04 MED ORDER — LEVOTHYROXINE SODIUM 100 MCG PO TABS: 100.0000 ug | ORAL_TABLET | Freq: Every day | ORAL | Status: DC

## 2012-02-04 NOTE — Telephone Encounter (Signed)
Thank you for sending it.

## 2012-02-04 NOTE — Telephone Encounter (Signed)
Spoke with [facility] they did not receive phone in on 12/26/11; waited several minutes for pharmacist and no answer so I sent electronically. Patient notified as instructed by telephone and pt has one pill left and will pick up rx tomorrow.

## 2012-02-04 NOTE — Telephone Encounter (Signed)
Caller: [name]/Patient; Patient Name: Lauren Beck; PCP: Judy Pimple.; Best Callback Phone Number: [phone] Pt calling stating her Prescription for Thyroid ( one time daily) is expired per Pharmacy. Pt also scheduling appt for recheck from 12/30/11 visit; pt missed 01/30/12 appt-overslept. Pt asking that enough Synthroid be called in until Dr. Milinda Antis can renew prescription. Pharmacy is [phone]-(on file). Transfered pt to [facility] for appt per pt request.

## 2012-02-10 ENCOUNTER — Ambulatory Visit (INDEPENDENT_AMBULATORY_CARE_PROVIDER_SITE_OTHER): Payer: Self-pay | Admitting: Family Medicine

## 2012-02-10 ENCOUNTER — Encounter: Payer: Self-pay | Admitting: Family Medicine

## 2012-02-10 ENCOUNTER — Ambulatory Visit: Payer: Self-pay

## 2012-02-10 ENCOUNTER — Other Ambulatory Visit: Payer: Self-pay | Admitting: Occupational Medicine

## 2012-02-10 VITALS — BP 100/58 | HR 66 | Temp 98.1°F | Ht 61.0 in | Wt 104.2 lb

## 2012-02-10 DIAGNOSIS — E039 Hypothyroidism, unspecified: Secondary | ICD-10-CM

## 2012-02-10 DIAGNOSIS — F439 Reaction to severe stress, unspecified: Secondary | ICD-10-CM

## 2012-02-10 DIAGNOSIS — R52 Pain, unspecified: Secondary | ICD-10-CM

## 2012-02-10 DIAGNOSIS — R4589 Other symptoms and signs involving emotional state: Secondary | ICD-10-CM

## 2012-02-10 MED ORDER — ALPRAZOLAM 0.5 MG PO TABS: 0.5000 mg | ORAL_TABLET | Freq: Every evening | ORAL | Status: AC | PRN

## 2012-02-10 NOTE — Assessment & Plan Note (Signed)
Doing much better  Took small dose of mirtazapine for a while and then stopped it because mood and sleep and appetite are better Tolerating stress- good support Given px for xanax #5 for emergencies only inst to quit caffeine and smoking

## 2012-02-10 NOTE — Progress Notes (Signed)
Subjective:    Patient ID: Lauren Beck, female    DOB: 04/22/1961, 51 y.o.   MRN: [id number]  HPI Here for f/u of stress reaction Last visit put on remeron 15 Called back and said that was too sedating so cut it in 1/2 Then she came off of them - because she was doing better   She was sleeping and eating better now and pleased with that  Is overall doing much better Has moments of anxiety -and wondered if there was something to take prn    Wt is up 2 lb- pleased with that  Was able to get counseling with her church  Stress is still there but managing it  Is looking for her own place and finding a 2nd job The separation is going to happen - she is getting used to the idea   Smoking -- still smoking - not ready to quit yet    Thyroid med was refilled - needs to pick it up No clinical changes No energy changes  Wt is up slt, no skin or hair problems Lab Results  Component Value Date   TSH 0.78 08/11/2011     Patient Active Problem List  Diagnosis  . HYPOTHYROIDISM  . PURE HYPERCHOLESTEROLEMIA  . TOBACCO ABUSE  . REACTION, ADJUSTMENT NOS  . SINUSITIS - ACUTE-NOS  . ALLERGIC RHINITIS  . COPD  . EXCESSIVE OR FREQUENT MENSTRUATION  . IRREGULAR MENSES  . URTICARIA  . STRESS ELECTROCARDIOGRAM, ABNORMAL  . MIGRAINES, HX OF  . COUGH  . Neck pain  . Stress reaction, emotional   Past Medical History  Diagnosis Date  . Allergic rhinitis   . COPD (chronic obstructive pulmonary disease)   . Hypothyroidism   . Excessive or frequent menstruation   . History of migraines   . Pure hypercholesterolemia   . Unspecified adjustment reaction   . Nonspecific abnormal electrocardiogram (ECG) (EKG)   . Tobacco use disorder    Past Surgical History  Procedure Date  . Hammer toe surgery   . Pelvic laparoscopy   . Tubal ligation    History  Substance Use Topics  . Smoking status: Current Everyday Smoker  . Smokeless tobacco: Not on file  . Alcohol Use: Yes   Family  History  Problem Relation Age of Onset  . Depression Mother     ?  Marland Kitchen Other Father     No contact--unknown   No Known Allergies Current Outpatient Prescriptions on File Prior to Visit  Medication Sig Dispense Refill  . albuterol (PROVENTIL HFA;VENTOLIN HFA) 108 (90 BASE) MCG/ACT inhaler Inhale 2 puffs into the lungs every 4 (four) hours as needed.        . Cholecalciferol (VITAMIN D) 1000 UNITS capsule Take 1,000 Units by mouth daily.        . fexofenadine (ALLEGRA) 180 MG tablet Take 180 mg by mouth daily.        . Fluticasone-Salmeterol (ADVAIR) 250-50 MCG/DOSE AEPB Inhale 1 puff into the lungs every 12 (twelve) hours.        Marland Kitchen levothyroxine (SYNTHROID, LEVOTHROID) 100 MCG tablet Take 1 tablet (100 mcg total) by mouth daily.  30 tablet  5  . Omega-3 Fatty Acids (FISH OIL) 1000 MG CAPS Take 1 capsule by mouth daily.        . fluticasone (FLONASE) 50 MCG/ACT nasal spray Place 2 sprays into the nose daily.  16 g  11  . ibuprofen (ADVIL,MOTRIN) 800 MG tablet TAKE ONE TABLET BY MOUTH THREE TIMES  DAILY WITH FOOD AS NEEDED NECK  PAIN/HEADACHE  60 tablet  3  . meloxicam (MOBIC) 15 MG tablet Take 1 tablet (15 mg total) by mouth daily.  30 tablet  5  . mirtazapine (REMERON) 15 MG tablet Take 1 tablet (15 mg total) by mouth at bedtime.  30 tablet  11     Review of Systems Review of Systems  Constitutional: Negative for fever, appetite change, fatigue and unexpected weight change.  Eyes: Negative for pain and visual disturbance.  Respiratory: Negative for cough and shortness of breath.   Cardiovascular: Negative for cp or palpitations    Gastrointestinal: Negative for nausea, diarrhea and constipation.  Genitourinary: Negative for urgency and frequency.  Skin: Negative for pallor or rash   MSK pos for shoulder pain from work- Writer comp later this week  Neurological: Negative for weakness, light-headedness, numbness and headaches.  Hematological: Negative for adenopathy. Does not  bruise/bleed easily.  Psychiatric/Behavioral: anxiety and depression symptoms are much improved          Objective:   Physical Exam  Constitutional: She appears well-developed and well-nourished. No distress.       Slim and well appearing   HENT:  Head: Normocephalic and atraumatic.  Mouth/Throat: Oropharynx is clear and moist.  Eyes: Conjunctivae and EOM are normal. Pupils are equal, round, and reactive to light. No scleral icterus.  Neck: Normal range of motion. Neck supple. No thyromegaly present.  Cardiovascular: Normal rate, regular rhythm, normal heart sounds and intact distal pulses.   Pulmonary/Chest: Effort normal and breath sounds normal. No respiratory distress. She has no wheezes.       Diffusely distant bs   Musculoskeletal: She exhibits no edema.  Lymphadenopathy:    She has no cervical adenopathy.  Neurological: She is alert. She has normal reflexes. She displays no tremor. No cranial nerve deficit. She exhibits normal muscle tone. Coordination normal.  Skin: Skin is warm and dry. No rash noted. No pallor.  Psychiatric: She has a normal mood and affect. Her speech is normal and behavior is normal. Thought content normal. Her mood appears not anxious. Her affect is not blunt and not labile. She does not exhibit a depressed mood.       Much improved affect          Assessment & Plan:

## 2012-02-10 NOTE — Patient Instructions (Addendum)
Get back on your thyroid medicine  I'm glad you are feeling better emotionally - and here is px for xanax (only for emergencies)  Keep eating regular meals  Keep getting support from church and family  Try to cut out caffeine Follow up in about 6 months

## 2012-02-12 NOTE — Assessment & Plan Note (Signed)
Lab Results  Component Value Date   TSH 0.78 08/11/2011    No clinical changes Med refilled

## 2012-04-13 ENCOUNTER — Encounter (HOSPITAL_COMMUNITY): Payer: Self-pay | Admitting: *Deleted

## 2012-04-13 ENCOUNTER — Emergency Department (HOSPITAL_COMMUNITY): Payer: Self-pay

## 2012-04-13 ENCOUNTER — Emergency Department (HOSPITAL_COMMUNITY)
Admission: EM | Admit: 2012-04-13 | Discharge: 2012-04-13 | Disposition: A | Discharge status: Home / Self Care | Payer: Self-pay | Attending: Emergency Medicine | Admitting: Emergency Medicine

## 2012-04-13 DIAGNOSIS — E7800 Pure hypercholesterolemia, unspecified: Secondary | ICD-10-CM | POA: Insufficient documentation

## 2012-04-13 DIAGNOSIS — Z79899 Other long term (current) drug therapy: Secondary | ICD-10-CM | POA: Insufficient documentation

## 2012-04-13 DIAGNOSIS — Z8669 Personal history of other diseases of the nervous system and sense organs: Secondary | ICD-10-CM | POA: Insufficient documentation

## 2012-04-13 DIAGNOSIS — F17200 Nicotine dependence, unspecified, uncomplicated: Secondary | ICD-10-CM | POA: Insufficient documentation

## 2012-04-13 DIAGNOSIS — Z9109 Other allergy status, other than to drugs and biological substances: Secondary | ICD-10-CM | POA: Insufficient documentation

## 2012-04-13 DIAGNOSIS — N946 Dysmenorrhea, unspecified: Secondary | ICD-10-CM | POA: Insufficient documentation

## 2012-04-13 DIAGNOSIS — J449 Chronic obstructive pulmonary disease, unspecified: Secondary | ICD-10-CM | POA: Insufficient documentation

## 2012-04-13 DIAGNOSIS — J4489 Other specified chronic obstructive pulmonary disease: Secondary | ICD-10-CM | POA: Insufficient documentation

## 2012-04-13 DIAGNOSIS — E039 Hypothyroidism, unspecified: Secondary | ICD-10-CM | POA: Insufficient documentation

## 2012-04-13 LAB — CBC WITH DIFFERENTIAL/PLATELET
Basophils Absolute: 0 10*3/uL (ref 0.0–0.1)
Basophils Relative: 0 % (ref 0–1)
Hemoglobin: 14.9 g/dL (ref 12.0–15.0)
MCHC: 33.9 g/dL (ref 30.0–36.0)
Monocytes Relative: 5 % (ref 3–12)
Neutro Abs: 6.6 10*3/uL (ref 1.7–7.7)
Neutrophils Relative %: 62 % (ref 43–77)
RDW: 13.8 % (ref 11.5–15.5)

## 2012-04-13 LAB — URINALYSIS, ROUTINE W REFLEX MICROSCOPIC
Bilirubin Urine: NEGATIVE
Glucose, UA: NEGATIVE mg/dL
Hgb urine dipstick: NEGATIVE
Ketones, ur: NEGATIVE mg/dL
pH: 6 (ref 5.0–8.0)

## 2012-04-13 LAB — BASIC METABOLIC PANEL
Chloride: 100 mEq/L (ref 96–112)
GFR calc Af Amer: 87 mL/min — ABNORMAL LOW (ref >90)
Potassium: 4.6 mEq/L (ref 3.5–5.1)
Sodium: 135 mEq/L (ref 135–145)

## 2012-04-13 LAB — URINE MICROSCOPIC-ADD ON

## 2012-04-13 MED ORDER — SODIUM CHLORIDE 0.9 % IV BOLUS (SEPSIS)
1000.0000 mL | Freq: Once | INTRAVENOUS | Status: AC
  Administered 2012-04-13: 1000 mL via INTRAVENOUS

## 2012-04-13 MED ORDER — HYDROCODONE-ACETAMINOPHEN 5-500 MG PO TABS: 1.0000 | ORAL_TABLET | Freq: Four times a day (QID) | ORAL | Status: DC | PRN

## 2012-04-13 MED ORDER — ONDANSETRON HCL 4 MG/2ML IJ SOLN
4.0000 mg | Freq: Once | INTRAMUSCULAR | Status: AC
  Administered 2012-04-13: 4 mg via INTRAVENOUS
  Filled 2012-04-13: qty 2

## 2012-04-13 MED ORDER — FENTANYL CITRATE 0.05 MG/ML IJ SOLN
50.0000 ug | Freq: Once | INTRAMUSCULAR | Status: AC
  Administered 2012-04-13: 50 ug via INTRAVENOUS
  Filled 2012-04-13: qty 2

## 2012-04-13 MED ORDER — ONDANSETRON HCL 4 MG PO TABS: 4.0000 mg | ORAL_TABLET | Freq: Four times a day (QID) | ORAL | Status: DC

## 2012-04-13 MED ORDER — IOHEXOL 300 MG/ML  SOLN
80.0000 mL | Freq: Once | INTRAMUSCULAR | Status: AC | PRN
  Administered 2012-04-13: 80 mL via INTRAVENOUS

## 2012-04-13 MED ORDER — FENTANYL CITRATE 0.05 MG/ML IJ SOLN
50.0000 ug | Freq: Once | INTRAMUSCULAR | Status: DC
  Filled 2012-04-13: qty 2

## 2012-04-13 NOTE — ED Notes (Signed)
Left mid quad pain x 3 days increasingly worse, denies nausea or vomiting, no diarrhea, burning type pain

## 2012-04-13 NOTE — ED Notes (Signed)
Pt return from CT.

## 2012-04-13 NOTE — ED Provider Notes (Signed)
History     CSN: [id number]  Arrival date & time 04/13/12  1658   First MD Initiated Contact with Patient 04/13/12 1716      Chief Complaint  Patient presents with  . Abdominal Pain    LMQ    (Consider location/radiation/quality/duration/timing/severity/associated sxs/prior treatment) HPI  Patient presents to the emergency department for complaints of left   upper quadrant pain. She states she has never had this before and that she has not been having any nausea, diarrhea or vomiting the pain is burning. She's not having any dysuria or gas. Her last bowel movement was yesterday and it was normal. She says her pain is a 20 out of 10 currently that she is on vacation from work and that she should be relaxing. nad vss Past Medical History  Diagnosis Date  . Allergic rhinitis   . COPD (chronic obstructive pulmonary disease)   . Hypothyroidism   . Excessive or frequent menstruation   . History of migraines   . Pure hypercholesterolemia   . Unspecified adjustment reaction   . Nonspecific abnormal electrocardiogram (ECG) (EKG)   . Tobacco use disorder     Past Surgical History  Procedure Date  . Hammer toe surgery   . Pelvic laparoscopy   . Tubal ligation     Family History  Problem Relation Age of Onset  . Depression Mother     ?  Marland Kitchen Other Father     No contact--unknown    History  Substance Use Topics  . Smoking status: Current Every Day Smoker  . Smokeless tobacco: Not on file  . Alcohol Use: Yes    OB History    Grav Para Term Preterm Abortions TAB SAB Ect Mult Living                  Review of Systems   Review of Systems  Gen: no weight loss, fevers, chills, night sweats  Eyes: no discharge or drainage, no occular pain or visual changes  Nose: no epistaxis or rhinorrhea  Mouth: no dental pain, no sore throat  Neck: no neck pain  Lungs:No wheezing, coughing or hemoptysis CV: no chest pain, palpitations, dependent edema or orthopnea  Abd: +   abdominal pain, no nausea, vomiting  GU: no dysuria or gross hematuria  MSK:  No abnormalities  Neuro: no headache, no focal neurologic deficits  Skin: no abnormalities Psyche: negative.    Allergies  Review of patient's allergies indicates no known allergies.  Home Medications   Current Outpatient Rx  Name Route Sig Dispense Refill  . ALBUTEROL SULFATE HFA 108 (90 BASE) MCG/ACT IN AERS Inhalation Inhale 2 puffs into the lungs every 4 (four) hours as needed. SHORTNESS of breath    . VITAMIN D 1000 UNITS PO CAPS Oral Take 1,000 Units by mouth daily.      Marland Kitchen FLUTICASONE-SALMETEROL 250-50 MCG/DOSE IN AEPB Inhalation Inhale 1 puff into the lungs every 12 (twelve) hours.      . INSOMNIA RELIEF PO Oral Take 1 tablet by mouth as needed. Insomnia    . LEVOTHYROXINE SODIUM 100 MCG PO TABS Oral Take 100 mcg by mouth daily.    Marland Kitchen FISH OIL 1000 MG PO CAPS Oral Take 1 capsule by mouth daily.      Marland Kitchen HYDROCODONE-ACETAMINOPHEN 5-500 MG PO TABS Oral Take 1-2 tablets by mouth every 6 (six) hours as needed for pain. 15 tablet 0  . HYDROCODONE-ACETAMINOPHEN 5-500 MG PO TABS Oral Take 1-2 tablets by mouth every  6 (six) hours as needed for pain. 15 tablet 0  . MIRTAZAPINE 15 MG PO TABS Oral Take 1 tablet (15 mg total) by mouth at bedtime. 30 tablet 11  . ONDANSETRON HCL 4 MG PO TABS Oral Take 1 tablet (4 mg total) by mouth every 6 (six) hours. 12 tablet 0    BP 110/74  Pulse 88  Temp 97.8 F (36.6 C) (Oral)  Resp 21  SpO2 99%  LMP 04/06/2012  Physical Exam  Nursing note and vitals reviewed. Constitutional: She appears well-developed and well-nourished. No distress.  HENT:  Head: Normocephalic and atraumatic.  Eyes: Pupils are equal, round, and reactive to light.  Neck: Normal range of motion. Neck supple.  Cardiovascular: Normal rate and regular rhythm.   Pulmonary/Chest: Effort normal.  Abdominal: Soft. She exhibits no distension and no mass. There is tenderness (LUQ). There is guarding. There  is no rebound.  Neurological: She is alert.  Skin: Skin is warm and dry.    ED Course  Procedures (including critical care time)  Labs Reviewed  URINALYSIS, ROUTINE W REFLEX MICROSCOPIC - Abnormal; Notable for the following:    Leukocytes, UA TRACE (*)     All other components within normal limits  URINE MICROSCOPIC-ADD ON - Abnormal; Notable for the following:    Squamous Epithelial / LPF FEW (*)     All other components within normal limits  CBC WITH DIFFERENTIAL - Abnormal; Notable for the following:    WBC 10.6 (*)     All other components within normal limits  BASIC METABOLIC PANEL - Abnormal; Notable for the following:    GFR calc non Af Amer 75 (*)     GFR calc Af Amer 87 (*)     All other components within normal limits  PREGNANCY, URINE   Ct Abdomen Pelvis W Contrast  04/13/2012  *RADIOLOGY REPORT*  Clinical Data: Left mid abdomen pain for 3 days  CT ABDOMEN AND PELVIS WITH CONTRAST  Technique:  Multidetector CT imaging of the abdomen and pelvis was performed following the standard protocol during bolus administration of intravenous contrast.  Contrast: 80mL OMNIPAQUE IOHEXOL 300 MG/ML  SOLN  Comparison: CT abdomen pelvis of 05/25/2009  Findings: The lung bases are clear.  The liver enhances with no focal abnormality and no ductal dilatation is seen.  The gallbladder is contracted and no calcified gallstones are noted. The pancreas is normal in size and the pancreatic duct is not dilated.  The adrenal glands and spleen are unremarkable.  The stomach is moderately distended with food debris with no abnormality noted.  The kidneys enhance and a nonobstructing left upper pole renal calculus is stable.  No hydronephrosis is seen. On delayed images, the pelvocaliceal systems are unremarkable.  The abdominal aorta is normal in caliber.  No adenopathy is seen.  The mesenteric vasculature appears patent.  The uterus is normal in size.  There is some prominence of the endometrium of  questionable significance.  Clinical correlation is recommended.  A small left ovarian cyst is present.  No free fluid is noted within the pelvis.  The urinary bladder is unremarkable. There are scattered colonic diverticula present.  The terminal ileum is unremarkable.  The appendix is not definitely seen.  No bony abnormality is noted.  IMPRESSION:  1.  No explanation for the patient's pain is seen. 2.  Nonobstructing left upper pole renal calculus. 3.  Somewhat prominent endometrium of questionable significance. Correlate clinically with the patient's menstrual cycle.   Original Report  Authenticated By: Juline Patch, M.D.      1. Dysmenorrhea       MDM  CT scan of the abdomen is normal. The patient is currently having absolutely no pain. She did not have any nausea, vomiting or diarrhea episode while the emergency department. Her urine pregnancy negative and her urinalysis is normal. Her CBC and BMP are both normal as well. I discussed all the findings patient and she is happier at home.  Her CT scan did show that she does have a somewhat prominent endometrium. The patient admits that she has been spotting the last couple of days which is abnormal for her but she has a primary care Dr. Dr. Lucretia Roers who she says she will see this week and that she is ready to go home. I've written a prescription for some pain medications and nausea medication she can followup with her primary care Dr.  Rock Nephew has been advised of the symptoms that warrant their return to the ED. Patient has voiced understanding and has agreed to follow-up with the PCP or specialist.         Dorthula Matas, PA 04/13/12 2121

## 2012-04-13 NOTE — ED Notes (Signed)
Pt to CT

## 2012-04-14 NOTE — ED Provider Notes (Signed)
Medical screening examination/treatment/procedure(s) were performed by non-physician practitioner and as supervising physician I was immediately available for consultation/collaboration.  Jones Skene, M.D.     Jones Skene, MD 04/14/12 0201

## 2012-04-19 ENCOUNTER — Telehealth: Payer: Self-pay | Admitting: Family Medicine

## 2012-04-19 NOTE — Telephone Encounter (Signed)
Pt scheduled appt for tomorrow 

## 2012-04-19 NOTE — Telephone Encounter (Signed)
Caller: Karinna/Patient; Patient Name: Lauren Beck; PCP: Roxy Manns; Best Callback Phone Number: [phone]. Lynden Ang is calling about pain in L side. Sts she was seen in [facility] ED on 04/14/12 and dx'd with dysmenorrhea. "I can't tell you how long since my LMP." CT helped with diagnosis. Was given Vicodin for pain. Abdomen is slightly distended but not as much as it was when seen in ED. Pain is intermittent but she feels it now--4/10 on pain scale of 0-10. Also reports yellow discharge but she does not know if she's incontinent of urine of if it's vaginal discharge. Sts she is wearing a pad to contain it.  Rn attempted to make an appt for 30 mins but unable to accomodate. Note sent to office.  Abdominal Pain. See in 4 hrs due to Unusual vaginal discharge.

## 2012-04-19 NOTE — Telephone Encounter (Signed)
Schedule her appt -does not have to be 30 minutes - we will do the best we can, thanks

## 2012-04-20 ENCOUNTER — Telehealth: Payer: Self-pay | Admitting: Family Medicine

## 2012-04-20 ENCOUNTER — Ambulatory Visit: Payer: Self-pay | Admitting: Family Medicine

## 2012-04-20 NOTE — Telephone Encounter (Signed)
Patient calling, had a 215p appt. today and arrived and was told that because she didn't have $125 that she could not be seen.  Was seen in ED and dx with dysmenorrhea.  The pain is on the left side and was told that her uterus is thickening and that is causing the pain.  She was given Vicodin at the ED but unable to take same because she is working.   Denies any bleeding.  She states that she has never been turned away from the office without money/insurance.

## 2012-04-20 NOTE — Telephone Encounter (Signed)
I will forward this to our office manager.

## 2012-05-28 ENCOUNTER — Emergency Department (HOSPITAL_COMMUNITY)
Admission: EM | Admit: 2012-05-28 | Discharge: 2012-05-29 | Disposition: A | Discharge status: Home / Self Care | Payer: Self-pay | Attending: Emergency Medicine | Admitting: Emergency Medicine

## 2012-05-28 ENCOUNTER — Encounter (HOSPITAL_COMMUNITY): Payer: Self-pay | Admitting: Emergency Medicine

## 2012-05-28 DIAGNOSIS — Z8742 Personal history of other diseases of the female genital tract: Secondary | ICD-10-CM | POA: Insufficient documentation

## 2012-05-28 DIAGNOSIS — Z8669 Personal history of other diseases of the nervous system and sense organs: Secondary | ICD-10-CM | POA: Insufficient documentation

## 2012-05-28 DIAGNOSIS — R059 Cough, unspecified: Secondary | ICD-10-CM | POA: Insufficient documentation

## 2012-05-28 DIAGNOSIS — E039 Hypothyroidism, unspecified: Secondary | ICD-10-CM | POA: Insufficient documentation

## 2012-05-28 DIAGNOSIS — N921 Excessive and frequent menstruation with irregular cycle: Secondary | ICD-10-CM | POA: Insufficient documentation

## 2012-05-28 DIAGNOSIS — E7800 Pure hypercholesterolemia, unspecified: Secondary | ICD-10-CM | POA: Insufficient documentation

## 2012-05-28 DIAGNOSIS — N200 Calculus of kidney: Secondary | ICD-10-CM | POA: Insufficient documentation

## 2012-05-28 DIAGNOSIS — F17200 Nicotine dependence, unspecified, uncomplicated: Secondary | ICD-10-CM | POA: Insufficient documentation

## 2012-05-28 DIAGNOSIS — R05 Cough: Secondary | ICD-10-CM | POA: Insufficient documentation

## 2012-05-28 DIAGNOSIS — J4489 Other specified chronic obstructive pulmonary disease: Secondary | ICD-10-CM | POA: Insufficient documentation

## 2012-05-28 DIAGNOSIS — J449 Chronic obstructive pulmonary disease, unspecified: Secondary | ICD-10-CM | POA: Insufficient documentation

## 2012-05-28 LAB — URINALYSIS, ROUTINE W REFLEX MICROSCOPIC
Bilirubin Urine: NEGATIVE
Glucose, UA: NEGATIVE mg/dL
Ketones, ur: NEGATIVE mg/dL
Protein, ur: 30 mg/dL — AB

## 2012-05-28 LAB — CBC WITH DIFFERENTIAL/PLATELET
HCT: 37.5 % (ref 36.0–46.0)
Hemoglobin: 12.8 g/dL (ref 12.0–15.0)
Lymphocytes Relative: 12 % (ref 12–46)
Monocytes Absolute: 0.8 10*3/uL (ref 0.1–1.0)
Monocytes Relative: 5 % (ref 3–12)
Neutro Abs: 13 10*3/uL — ABNORMAL HIGH (ref 1.7–7.7)
RBC: 3.89 MIL/uL (ref 3.87–5.11)
WBC: 15.6 10*3/uL — ABNORMAL HIGH (ref 4.0–10.5)

## 2012-05-28 LAB — URINE MICROSCOPIC-ADD ON

## 2012-05-28 MED ORDER — ONDANSETRON HCL 4 MG/2ML IJ SOLN
4.0000 mg | Freq: Once | INTRAMUSCULAR | Status: AC
  Administered 2012-05-28: 4 mg via INTRAVENOUS
  Filled 2012-05-28: qty 2

## 2012-05-28 MED ORDER — HYDROMORPHONE HCL PF 1 MG/ML IJ SOLN
1.0000 mg | Freq: Once | INTRAMUSCULAR | Status: AC
  Administered 2012-05-28: 1 mg via INTRAVENOUS
  Filled 2012-05-28: qty 1

## 2012-05-28 MED ORDER — ONDANSETRON HCL 4 MG/2ML IJ SOLN: 4.0000 mg | Freq: Once | INTRAMUSCULAR | Status: DC

## 2012-05-28 MED ORDER — SODIUM CHLORIDE 0.9 % IV SOLN
20.0000 mL | INTRAVENOUS | Status: DC
  Administered 2012-05-28: 1000 mL via INTRAVENOUS

## 2012-05-28 MED ORDER — FENTANYL CITRATE 0.05 MG/ML IJ SOLN
50.0000 ug | Freq: Once | INTRAMUSCULAR | Status: AC
  Administered 2012-05-28: 50 ug via INTRAVENOUS
  Filled 2012-05-28: qty 2

## 2012-05-28 MED ORDER — SODIUM CHLORIDE 0.9 % IV BOLUS (SEPSIS): 1000.0000 mL | Freq: Once | INTRAVENOUS | Status: DC

## 2012-05-28 NOTE — ED Provider Notes (Signed)
History     CSN: [id number]  Arrival date & time 05/28/12  2050   First MD Initiated Contact with Patient 05/28/12 2322      Chief Complaint  Patient presents with  . Abdominal Pain    (Consider location/radiation/quality/duration/timing/severity/associated sxs/prior treatment) HPI Comments: 51 year old female with a history of COPD but no other significant medical problems. She was seen in the middle of October for left-sided abdominal pain at which time she was evaluated with lab work and a CT scan showing no obvious findings to explain her left-sided pain. She could not followup with her gynecologist stating that she did not have the financial means to see them. She states today that she has been having 3 days of recurrent gradually worsening and now severe sharp left-sided abdominal pain which starts in the suprapubic region and extends to the left mid abdomen. This is worse with movement, coughing but not associated with diarrhea, fever, chills. She does have some dysuria. She has never had diverticulitis, she has not had regular menstrual periods in several years and she does have intermittent spotting over the last year.  Patient is a 51 y.o. female presenting with abdominal pain. The history is provided by the patient, the spouse and medical records.  Abdominal Pain The primary symptoms of the illness include abdominal pain.    Past Medical History  Diagnosis Date  . Allergic rhinitis   . COPD (chronic obstructive pulmonary disease)   . Hypothyroidism   . Excessive or frequent menstruation   . History of migraines   . Pure hypercholesterolemia   . Unspecified adjustment reaction   . Nonspecific abnormal electrocardiogram (ECG) (EKG)   . Tobacco use disorder     Past Surgical History  Procedure Date  . Hammer toe surgery   . Pelvic laparoscopy   . Tubal ligation     Family History  Problem Relation Age of Onset  . Depression Mother     ?  Marland Kitchen Other Father     No  contact--unknown    History  Substance Use Topics  . Smoking status: Current Every Day Smoker  . Smokeless tobacco: Not on file  . Alcohol Use: Yes    OB History    Grav Para Term Preterm Abortions TAB SAB Ect Mult Living                  Review of Systems  Gastrointestinal: Positive for abdominal pain.  All other systems reviewed and are negative.    Allergies  Review of patient's allergies indicates no known allergies.  Home Medications   Current Outpatient Rx  Name  Route  Sig  Dispense  Refill  . VITAMIN D 1000 UNITS PO CAPS   Oral   Take 1,000 Units by mouth daily.           Marland Kitchen LEVOTHYROXINE SODIUM 100 MCG PO TABS   Oral   Take 100 mcg by mouth daily.         Marland Kitchen FISH OIL 1000 MG PO CAPS   Oral   Take 1 capsule by mouth daily.             BP 95/54  Pulse 68  Temp 98.2 F (36.8 C) (Oral)  Resp 18  SpO2 98%  LMP 04/06/2012  Physical Exam  Nursing note and vitals reviewed. Constitutional: She appears well-developed and well-nourished.       Uncomfortable appearing  HENT:  Head: Normocephalic and atraumatic.  Mouth/Throat: Oropharynx is clear and  moist. No oropharyngeal exudate.  Eyes: Conjunctivae normal and EOM are normal. Pupils are equal, round, and reactive to light. Right eye exhibits no discharge. Left eye exhibits no discharge. No scleral icterus.  Neck: Normal range of motion. Neck supple. No JVD present. No thyromegaly present.  Cardiovascular: Normal rate, regular rhythm, normal heart sounds and intact distal pulses.  Exam reveals no gallop and no friction rub.   No murmur heard. Pulmonary/Chest: Effort normal and breath sounds normal. No respiratory distress. She has no wheezes. She has no rales.  Abdominal: Soft. Bowel sounds are normal. She exhibits no distension and no mass. There is tenderness ( Suprapubic and left lower quadrant and left midabdominal tenderness with guarding, no right sided tenderness, no pain at McBurney's point).   Musculoskeletal: Normal range of motion. She exhibits no edema and no tenderness.  Lymphadenopathy:    She has no cervical adenopathy.  Neurological: She is alert. Coordination normal.  Skin: Skin is warm and dry. No rash noted. No erythema.  Psychiatric: She has a normal mood and affect. Her behavior is normal.    ED Course  Procedures (including critical care time)  Labs Reviewed  CBC WITH DIFFERENTIAL - Abnormal; Notable for the following:    WBC 15.6 (*)     Neutrophils Relative 83 (*)     Neutro Abs 13.0 (*)     All other components within normal limits  COMPREHENSIVE METABOLIC PANEL - Abnormal; Notable for the following:    Glucose, Bld 131 (*)     Total Bilirubin 0.2 (*)     GFR calc non Af Amer 76 (*)     GFR calc Af Amer 89 (*)     All other components within normal limits  URINALYSIS, ROUTINE W REFLEX MICROSCOPIC - Abnormal; Notable for the following:    Color, Urine AMBER (*)  BIOCHEMICALS MAY BE AFFECTED BY COLOR   APPearance CLOUDY (*)     Hgb urine dipstick LARGE (*)     Protein, ur 30 (*)     Leukocytes, UA TRACE (*)     All other components within normal limits  LIPASE, BLOOD  URINE MICROSCOPIC-ADD ON  PREGNANCY, URINE   Ct Abdomen Pelvis Wo Contrast  05/29/2012  *RADIOLOGY REPORT*  Clinical Data: Left-sided abdominal pain.  Question urinary tract stone.  CT ABDOMEN AND PELVIS WITHOUT CONTRAST  Technique:  Multidetector CT imaging of the abdomen and pelvis was performed following the standard protocol without intravenous contrast.  Comparison: 04/13/2012  Findings: Dependent bibasilar atelectasis present.  Heart is normal size.  No effusions.  5 mm left ureteral vesicle junction stone noted with mild left hydronephrosis.  Punctate stone in the mid pole of the left kidney. No stones or hydronephrosis on the right.  Bilateral tubal ligation clips noted.  Uterus and adnexa are unremarkable on this unenhanced study.  Liver, stomach, spleen, pancreas, adrenals have an  unremarkable unenhanced appearance.  Gallbladder is decompressed. Bowel grossly unremarkable.  No free fluid, free air, or adenopathy.  Appendix is visualized and is normal.  No acute bony abnormality.  IMPRESSION: 5 mm left UVJ stone with mild left hydronephrosis.  Punctate nonobstructing left mid pole renal stone.   Original Report Authenticated By: Charlett Nose, M.D.      1. Kidney stone on left side       MDM  The patient has severe left lower quadrant pain, she does not have peritoneal signs and does not have a surgical abdomen. I will proceed with lab  work including a urinalysis and a potential CT scan of the urinalysis does not show signs of significant urinary infection. Pain medication, nausea medicine, fluids as the patient has not had much to drink today. I would consider diverticulitis, pelvic abscess, ovarian abnormalities though the patient has no history of ovarian pathology.  The patient has improved significantly after medications. Due to her hematuria and left-sided pain I suspect a kidney stone. The CT scan does confirm that she has a 5 mm ureterovesical junction stone. Only mild obstruction, the patient appears much more comfortable and has no pain at this time. She has been given Toradol prior to discharge, urology followup.      Vida Roller, MD 05/29/12 [phone]

## 2012-05-28 NOTE — ED Notes (Signed)
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

## 2012-05-29 ENCOUNTER — Emergency Department (HOSPITAL_COMMUNITY): Payer: Self-pay

## 2012-05-29 LAB — COMPREHENSIVE METABOLIC PANEL
AST: 18 U/L (ref 0–37)
Alkaline Phosphatase: 103 U/L (ref 39–117)
BUN: 18 mg/dL (ref 6–23)
CO2: 24 mEq/L (ref 19–32)
Chloride: 102 mEq/L (ref 96–112)
Creatinine, Ser: 0.87 mg/dL (ref 0.50–1.10)
GFR calc non Af Amer: 76 mL/min — ABNORMAL LOW (ref >90)
Total Bilirubin: 0.2 mg/dL — ABNORMAL LOW (ref 0.3–1.2)

## 2012-05-29 LAB — LIPASE, BLOOD: Lipase: 23 U/L (ref 11–59)

## 2012-05-29 MED ORDER — KETOROLAC TROMETHAMINE 30 MG/ML IJ SOLN
30.0000 mg | Freq: Once | INTRAMUSCULAR | Status: AC
  Administered 2012-05-29: 30 mg via INTRAVENOUS
  Filled 2012-05-29: qty 1

## 2012-05-29 MED ORDER — NAPROXEN 500 MG PO TABS: 500.0000 mg | ORAL_TABLET | Freq: Two times a day (BID) | ORAL | Status: DC

## 2012-05-29 MED ORDER — HYDROCODONE-ACETAMINOPHEN 5-500 MG PO TABS: 1.0000 | ORAL_TABLET | Freq: Four times a day (QID) | ORAL | Status: DC | PRN

## 2012-05-29 MED ORDER — PROMETHAZINE HCL 25 MG PO TABS: 25.0000 mg | ORAL_TABLET | Freq: Four times a day (QID) | ORAL | Status: DC | PRN

## 2012-05-29 MED ORDER — TAMSULOSIN HCL 0.4 MG PO CAPS: 0.4000 mg | ORAL_CAPSULE | Freq: Two times a day (BID) | ORAL | Status: DC

## 2012-05-29 NOTE — ED Notes (Signed)
Pt resting comfortably

## 2012-07-26 ENCOUNTER — Emergency Department (HOSPITAL_COMMUNITY)
Admission: EM | Admit: 2012-07-26 | Discharge: 2012-07-26 | Disposition: A | Discharge status: Home / Self Care | Payer: Self-pay | Attending: Emergency Medicine | Admitting: Emergency Medicine

## 2012-07-26 ENCOUNTER — Encounter (HOSPITAL_COMMUNITY): Payer: Self-pay | Admitting: *Deleted

## 2012-07-26 DIAGNOSIS — K279 Peptic ulcer, site unspecified, unspecified as acute or chronic, without hemorrhage or perforation: Secondary | ICD-10-CM

## 2012-07-26 DIAGNOSIS — Z79899 Other long term (current) drug therapy: Secondary | ICD-10-CM | POA: Insufficient documentation

## 2012-07-26 DIAGNOSIS — Z8742 Personal history of other diseases of the female genital tract: Secondary | ICD-10-CM | POA: Insufficient documentation

## 2012-07-26 DIAGNOSIS — Z862 Personal history of diseases of the blood and blood-forming organs and certain disorders involving the immune mechanism: Secondary | ICD-10-CM | POA: Insufficient documentation

## 2012-07-26 DIAGNOSIS — Z8679 Personal history of other diseases of the circulatory system: Secondary | ICD-10-CM | POA: Insufficient documentation

## 2012-07-26 DIAGNOSIS — J4489 Other specified chronic obstructive pulmonary disease: Secondary | ICD-10-CM | POA: Insufficient documentation

## 2012-07-26 DIAGNOSIS — Z8639 Personal history of other endocrine, nutritional and metabolic disease: Secondary | ICD-10-CM | POA: Insufficient documentation

## 2012-07-26 DIAGNOSIS — J449 Chronic obstructive pulmonary disease, unspecified: Secondary | ICD-10-CM | POA: Insufficient documentation

## 2012-07-26 DIAGNOSIS — E785 Hyperlipidemia, unspecified: Secondary | ICD-10-CM | POA: Insufficient documentation

## 2012-07-26 DIAGNOSIS — R1013 Epigastric pain: Secondary | ICD-10-CM | POA: Insufficient documentation

## 2012-07-26 DIAGNOSIS — E039 Hypothyroidism, unspecified: Secondary | ICD-10-CM | POA: Insufficient documentation

## 2012-07-26 DIAGNOSIS — F17200 Nicotine dependence, unspecified, uncomplicated: Secondary | ICD-10-CM | POA: Insufficient documentation

## 2012-07-26 LAB — COMPREHENSIVE METABOLIC PANEL
AST: 22 U/L (ref 0–37)
Albumin: 3.7 g/dL (ref 3.5–5.2)
Calcium: 9.2 mg/dL (ref 8.4–10.5)
Creatinine, Ser: 0.89 mg/dL (ref 0.50–1.10)
GFR calc non Af Amer: 74 mL/min — ABNORMAL LOW (ref >90)
Total Protein: 7 g/dL (ref 6.0–8.3)

## 2012-07-26 LAB — URINALYSIS, ROUTINE W REFLEX MICROSCOPIC
Leukocytes, UA: NEGATIVE
Nitrite: NEGATIVE
Specific Gravity, Urine: 1.029 (ref 1.005–1.030)
pH: 5.5 (ref 5.0–8.0)

## 2012-07-26 LAB — CBC WITH DIFFERENTIAL/PLATELET
Basophils Absolute: 0 10*3/uL (ref 0.0–0.1)
Basophils Relative: 0 % (ref 0–1)
Eosinophils Relative: 1 % (ref 0–5)
HCT: 40.1 % (ref 36.0–46.0)
MCHC: 34.2 g/dL (ref 30.0–36.0)
MCV: 97.3 fL (ref 78.0–100.0)
Monocytes Absolute: 0.5 10*3/uL (ref 0.1–1.0)
Platelets: 226 10*3/uL (ref 150–400)
RDW: 12.5 % (ref 11.5–15.5)

## 2012-07-26 LAB — PROTIME-INR: INR: 0.91 (ref 0.00–1.49)

## 2012-07-26 LAB — LIPASE, BLOOD: Lipase: 15 U/L (ref 11–59)

## 2012-07-26 MED ORDER — GI COCKTAIL ~~LOC~~
30.0000 mL | Freq: Once | ORAL | Status: AC
  Administered 2012-07-26: 30 mL via ORAL
  Filled 2012-07-26: qty 30

## 2012-07-26 MED ORDER — SODIUM CHLORIDE 0.9 % IV SOLN
1000.0000 mL | Freq: Once | INTRAVENOUS | Status: AC
  Administered 2012-07-26: 1000 mL via INTRAVENOUS

## 2012-07-26 MED ORDER — MORPHINE SULFATE 4 MG/ML IJ SOLN
4.0000 mg | Freq: Once | INTRAMUSCULAR | Status: AC
  Administered 2012-07-26: 4 mg via INTRAVENOUS
  Filled 2012-07-26: qty 1

## 2012-07-26 MED ORDER — SODIUM CHLORIDE 0.9 % IV SOLN: 1000.0000 mL | INTRAVENOUS | Status: DC

## 2012-07-26 MED ORDER — ACETAMINOPHEN-CODEINE 300-30 MG PO TABS: 1.0000 | ORAL_TABLET | ORAL | Status: DC | PRN

## 2012-07-26 MED ORDER — OXYCODONE-ACETAMINOPHEN 5-325 MG PO TABS: 1.0000 | ORAL_TABLET | ORAL | Status: DC | PRN

## 2012-07-26 MED ORDER — ONDANSETRON HCL 4 MG/2ML IJ SOLN
4.0000 mg | Freq: Once | INTRAMUSCULAR | Status: AC
  Administered 2012-07-26: 4 mg via INTRAVENOUS
  Filled 2012-07-26: qty 2

## 2012-07-26 MED ORDER — RANITIDINE HCL 150 MG PO TABS: 150.0000 mg | ORAL_TABLET | Freq: Two times a day (BID) | ORAL | Status: DC

## 2012-07-26 MED ORDER — SUCRALFATE 1 G PO TABS: 1.0000 g | ORAL_TABLET | Freq: Four times a day (QID) | ORAL | Status: DC

## 2012-07-26 NOTE — Discharge Instructions (Signed)
Take Zantac daily. Take Carafate four times daily for one week. Please avoid all Advil, Aleve, Aspirin and Alcohol.  Peptic Ulcers Ulcers are small, open craters or sores that develop in the lining of the stomach or the duodenum (the first part of the small intestine). The term peptic ulcer is used to describe both types of ulcers. There are a number of treatments that relieve the discomfort of ulcers. In most cases ulcers do heal.  CAUSES AND COMMON FEATURES OF PEPTIC ULCERS  Peptic ulcers occur only in areas of the digestive system that come in contact with digestive juices. These juices are secreted (given off) by the stomach. They include acid and an enzyme called pepsin that breaks down proteins. Many people with duodenal ulcers have too much digestive juice spilling down from the stomach. Most people with gastric (stomach) ulcers have normal or below normal amounts of stomach acid. Sometimes, when the mucous membrane (protective lining) of the stomach and duodenum does not protect well, this may add to the growth of ulcers. Duodenal ulcers often produces pain in a small area between the breastbone and navel. Pain varies from hunger pain to constant gnawing or burning sensations (feeling). Sometimes the pain is felt during sleep and may awaken the person in the middle of the night. Often the pain occurs two or three hours after eating, when the stomach is empty. Other common symptoms (problems) include overeating for pain relief. Eating relieves the pain of a duodenal ulcer. Gastric ulcer pain may be felt in the same place as the pain of a duodenal ulcer, or slightly higher up. There may also be sensations of feeling full, indigestion, and heartburn. Sometimes pain occurs when the stomach is full. This causes loss of appetite followed by weight loss. HOME CARE INSTRUCTIONS   Use of tobacco products have been found to slow down the healing of an ulcer. STOP SMOKING.  Avoid alcohol, aspirin, and other  inflammation (swelling and soreness) reducing drugs. These substances weaken the stomach lining.  Eat regular, nutritious meals.  Avoid foods that bother you.  Take medications and antacids as directed. Over-the-counter medications are used to neutralize stomach acid. Prescription medications reduce acid secretion, block acid production, or provide a protective coating over the ulcer. If a specific antacid was prescribed, do not switch brands without your caregiver's approval. Surgery is usually not necessary. Diet and/or drug therapy usually is effective. Surgery may be necessary if perforation, obstruction due to scarring, or uncontrollable bleeding is found, or if severe pain is not otherwise controlled. SEEK IMMEDIATE MEDICAL CARE IF:  You see signs of bleeding. This includes vomiting fresh, bright red blood or passing bloody or tarry, black stools.  You suffer weakness, fatigue, or loss of consciousness. These symptoms can result from severe hemorrhaging (bleeding). Shock may result.  You have sudden, intense, severe abdominal (belly) pain. This is the first sign of a perforation. This would require immediate surgical treatment.  You have intense pain and continued vomiting. This could signal an obstruction of the digestive tract. Document Released: 06/06/2000 Document Revised: 09/01/2011 Document Reviewed: 06/05/2008 [facility] Patient Information 2013 [place], [place].

## 2012-07-26 NOTE — ED Notes (Signed)
Pt states that on Saturday she had eaten a salad and shortly afterwards around 1230 midnight she became sick to her stomach and has had ABD pain and vomiting ever since.

## 2012-07-26 NOTE — ED Provider Notes (Signed)
I saw and evaluated the patient, reviewed the resident's note and I agree with the findings and plan.   .Face to face Exam:  General:  Awake HEENT:  Atraumatic Resp:  Normal effort Abd:  Nondistended Neuro:No focal weakness Lymph: No adenopathy   Nelia Shi, MD 07/26/12 1152

## 2012-07-26 NOTE — ED Provider Notes (Signed)
History     CSN: [id number]  Arrival date & time 07/26/12  0855   First MD Initiated Contact with Patient 07/26/12 [phone]      Chief Complaint  Patient presents with  . Nausea  . Emesis  . Abdominal Pain    HPI Patient is a 52 yo F with PMH of COPD and hypothyroidism presenting with 2 day history of N/V and abd pain. Patient states she ate a salad for lunch at her workplace [employer]) 2 days ago. 12 hours later she had vomiting and epigastric abdominal pain. She denies any diarrhea or fevers. Yesterday she had the same and had emesis x4 total. Her last report of vomiting was at 6pm yesterday. She was able to tolerate a small amount of soup last night and coffee this morning but has persistent pain and nausea. 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 Her last bowel movement was 3 days ago, which is normal for her. Denies HA, congestion, CP, SOB, dysuria or rash.  Past Medical History  Diagnosis Date  . Allergic rhinitis   . COPD (chronic obstructive pulmonary disease)   . Hypothyroidism   . Excessive or frequent menstruation   . History of migraines   . Pure hypercholesterolemia   . Unspecified adjustment reaction   . Nonspecific abnormal electrocardiogram (ECG) (EKG)   . Tobacco use disorder     Past Surgical History  Procedure Date  . Hammer toe surgery   . Pelvic laparoscopy   . Tubal ligation     Family History  Problem Relation Age of Onset  . Depression Mother     ?  Marland Kitchen Other Father     No contact--unknown    History  Substance Use Topics  . Smoking status: Current Every Day Smoker  . Smokeless tobacco: Not on file  . Alcohol Use: Yes    OB History    Grav Para Term Preterm Abortions TAB SAB Ect Mult Living                  Review of Systems  All other systems reviewed and are negative.    Allergies  Review of patient's allergies indicates no known  allergies.  Home Medications   Current Outpatient Rx  Name  Route  Sig  Dispense  Refill  . VITAMIN D 1000 UNITS PO CAPS   Oral   Take 1,000 Units by mouth daily.           Marland Kitchen LEVOTHYROXINE SODIUM 100 MCG PO TABS   Oral   Take 100 mcg by mouth daily.         Marland Kitchen FISH OIL 1000 MG PO CAPS   Oral   Take 1 capsule by mouth daily.           . ACETAMINOPHEN-CODEINE 300-30 MG PO TABS   Oral   Take 1 tablet by mouth every 4 (four) hours as needed for pain.   30 tablet   0   . RANITIDINE HCL 150 MG PO TABS   Oral   Take 1 tablet (150 mg total) by mouth 2 (two) times daily.   60 tablet   1   . SUCRALFATE 1 G PO TABS   Oral   Take 1 tablet (1 g total) by mouth 4 (four) times daily.   40 tablet   0     BP 122/72  Pulse 88  Temp 98.3 F (36.8 C) (Oral)  Resp 16  SpO2 100%  LMP 04/06/2012  Physical Exam  Constitutional: She is oriented to person, place, and time. She appears well-developed and well-nourished. No distress.  HENT:  Head: Normocephalic and atraumatic.  Mouth/Throat: Mucous membranes are dry. No posterior oropharyngeal edema or posterior oropharyngeal erythema.  Cardiovascular: Normal rate, regular rhythm and normal heart sounds.   No murmur heard. Pulmonary/Chest: Effort normal and breath sounds normal. No respiratory distress. She has no wheezes.  Abdominal: Soft. Normal appearance and bowel sounds are normal. She exhibits no distension and no abdominal bruit. There is tenderness in the epigastric area and periumbilical area. There is no rebound, no CVA tenderness and negative Murphy's sign.  Musculoskeletal: Normal range of motion. She exhibits no edema.  Neurological: She is alert and oriented to person, place, and time. No cranial nerve deficit.  Skin: Skin is warm and dry. No rash noted.    ED Course  [place] bedside Date/Time: 07/26/2012 9:30 AM Performed by: Nelia Shi Authorized by: Nelia Shi Consent: Verbal consent obtained. Risks  and benefits: risks, benefits and alternatives were discussed Consent given by: patient Patient understanding: patient states understanding of the procedure being performed Comments: Performed by Dr. Radford Pax to evaluate for gallstones. Gallbladder visualized but not able to appreciate any stones or thickening.    Labs Reviewed  COMPREHENSIVE METABOLIC PANEL - Abnormal; Notable for the following:    GFR calc non Af Amer 74 (*)     GFR calc Af Amer 86 (*)     All other components within normal limits  URINALYSIS, ROUTINE W REFLEX MICROSCOPIC - Abnormal; Notable for the following:    Color, Urine AMBER (*)  BIOCHEMICALS MAY BE AFFECTED BY COLOR   APPearance CLOUDY (*)     Bilirubin Urine SMALL (*)     Ketones, ur TRACE (*)     All other components within normal limits  CBC WITH DIFFERENTIAL  AMYLASE  LIPASE, BLOOD  LACTIC ACID, PLASMA  PROTIME-INR   No results found.   1. Peptic ulcer     MDM  52 yo F with epigastric and periumbilical pain.  DDx includes pancreatitis, cholecystitis, gastroenteritis, diverticular disease, peptic ulcer disease. UTI and ovarian process less likely based on location of pain  0910- Patient seen and examined. Labs ordered. Will start IV and give bolus of fluid given decreased PO intake. Will also give Morphine for pain and Zofran for nausea 0930- Dr. Radford Pax performed bedside ultrasound to evaluate for gallstones. Unable to visualize. Pain remains midline. 1115- Labs reviewed. All within normal limits. WBC normal, lipase, Creat and LFT all normal. Pain most likely not inflammatory or infection. Could be secondary to peptic ulcer. Will give GI cocktail and offer fluids. Will discharge home with Zantac daily and Carafate x 1 week. She states she cannot take Percocet. Advised to avoid all NSAIDs, but can take Tylenol    Hilarie Fredrickson, MD 07/26/12 1148

## 2012-08-30 ENCOUNTER — Encounter (HOSPITAL_COMMUNITY): Payer: Self-pay | Admitting: Emergency Medicine

## 2012-08-30 ENCOUNTER — Emergency Department (HOSPITAL_COMMUNITY): Payer: Self-pay

## 2012-08-30 ENCOUNTER — Emergency Department (HOSPITAL_COMMUNITY)
Admission: EM | Admit: 2012-08-30 | Discharge: 2012-08-30 | Disposition: A | Discharge status: Home / Self Care | Payer: Self-pay | Attending: Emergency Medicine | Admitting: Emergency Medicine

## 2012-08-30 DIAGNOSIS — J4489 Other specified chronic obstructive pulmonary disease: Secondary | ICD-10-CM | POA: Insufficient documentation

## 2012-08-30 DIAGNOSIS — Z9851 Tubal ligation status: Secondary | ICD-10-CM | POA: Insufficient documentation

## 2012-08-30 DIAGNOSIS — Z8742 Personal history of other diseases of the female genital tract: Secondary | ICD-10-CM | POA: Insufficient documentation

## 2012-08-30 DIAGNOSIS — Z8679 Personal history of other diseases of the circulatory system: Secondary | ICD-10-CM | POA: Insufficient documentation

## 2012-08-30 DIAGNOSIS — Z9889 Other specified postprocedural states: Secondary | ICD-10-CM | POA: Insufficient documentation

## 2012-08-30 DIAGNOSIS — J449 Chronic obstructive pulmonary disease, unspecified: Secondary | ICD-10-CM | POA: Insufficient documentation

## 2012-08-30 DIAGNOSIS — F17200 Nicotine dependence, unspecified, uncomplicated: Secondary | ICD-10-CM | POA: Insufficient documentation

## 2012-08-30 DIAGNOSIS — E039 Hypothyroidism, unspecified: Secondary | ICD-10-CM | POA: Insufficient documentation

## 2012-08-30 DIAGNOSIS — Z79899 Other long term (current) drug therapy: Secondary | ICD-10-CM | POA: Insufficient documentation

## 2012-08-30 DIAGNOSIS — E7800 Pure hypercholesterolemia, unspecified: Secondary | ICD-10-CM | POA: Insufficient documentation

## 2012-08-30 DIAGNOSIS — K279 Peptic ulcer, site unspecified, unspecified as acute or chronic, without hemorrhage or perforation: Secondary | ICD-10-CM | POA: Insufficient documentation

## 2012-08-30 LAB — CBC WITH DIFFERENTIAL/PLATELET
Basophils Absolute: 0 10*3/uL (ref 0.0–0.1)
Basophils Relative: 0 % (ref 0–1)
Eosinophils Relative: 1 % (ref 0–5)
HCT: 41.4 % (ref 36.0–46.0)
MCHC: 33.8 g/dL (ref 30.0–36.0)
Monocytes Absolute: 0.5 10*3/uL (ref 0.1–1.0)
Neutro Abs: 4.2 10*3/uL (ref 1.7–7.7)
RDW: 12.5 % (ref 11.5–15.5)

## 2012-08-30 LAB — COMPREHENSIVE METABOLIC PANEL
AST: 19 U/L (ref 0–37)
Albumin: 3.5 g/dL (ref 3.5–5.2)
Calcium: 9.1 mg/dL (ref 8.4–10.5)
Chloride: 102 mEq/L (ref 96–112)
Creatinine, Ser: 0.86 mg/dL (ref 0.50–1.10)
Total Protein: 7.5 g/dL (ref 6.0–8.3)

## 2012-08-30 LAB — URINALYSIS, ROUTINE W REFLEX MICROSCOPIC
Glucose, UA: NEGATIVE mg/dL
Leukocytes, UA: NEGATIVE
Specific Gravity, Urine: 1.009 (ref 1.005–1.030)
pH: 6.5 (ref 5.0–8.0)

## 2012-08-30 MED ORDER — SUCRALFATE 1 G PO TABS: 1.0000 g | ORAL_TABLET | Freq: Four times a day (QID) | ORAL | Status: DC

## 2012-08-30 MED ORDER — SODIUM CHLORIDE 0.9 % IV BOLUS (SEPSIS)
1000.0000 mL | INTRAVENOUS | Status: AC
  Administered 2012-08-30: 1000 mL via INTRAVENOUS

## 2012-08-30 MED ORDER — GI COCKTAIL ~~LOC~~
30.0000 mL | Freq: Once | ORAL | Status: AC
  Administered 2012-08-30: 30 mL via ORAL
  Filled 2012-08-30: qty 30

## 2012-08-30 MED ORDER — RANITIDINE HCL 150 MG PO TABS: 150.0000 mg | ORAL_TABLET | Freq: Two times a day (BID) | ORAL | Status: DC

## 2012-08-30 MED ORDER — ONDANSETRON HCL 4 MG/2ML IJ SOLN
4.0000 mg | Freq: Once | INTRAMUSCULAR | Status: AC
  Administered 2012-08-30: 4 mg via INTRAVENOUS
  Filled 2012-08-30: qty 2

## 2012-08-30 MED ORDER — OMEPRAZOLE 20 MG PO CPDR: 20.0000 mg | DELAYED_RELEASE_CAPSULE | Freq: Every day | ORAL | Status: DC

## 2012-08-30 MED ORDER — PANTOPRAZOLE SODIUM 40 MG IV SOLR
40.0000 mg | INTRAVENOUS | Status: AC
  Administered 2012-08-30: 40 mg via INTRAVENOUS
  Filled 2012-08-30: qty 40

## 2012-08-30 MED ORDER — FAMOTIDINE IN NACL 20-0.9 MG/50ML-% IV SOLN
20.0000 mg | INTRAVENOUS | Status: AC
  Administered 2012-08-30: 20 mg via INTRAVENOUS
  Filled 2012-08-30: qty 50

## 2012-08-30 NOTE — ED Notes (Signed)
Pt c/o mid upper abdominal pain. Pt states hx of ulcer.

## 2012-08-30 NOTE — ED Provider Notes (Signed)
History     CSN: [id number]  Arrival date & time 08/30/12  8469   First MD Initiated Contact with Patient 08/30/12 [phone]      Chief Complaint  Patient presents with  . Abdominal Pain    (Consider location/radiation/quality/duration/timing/severity/associated sxs/prior treatment) HPI Comments: Pt is a 52 y/o female with hx of BTL but no other abd surgery who presents with abdominal pain - she states that she was diagnosed with this in the passed and has been taking zantac and carafate which helps in the past but not over the last few days.  She is not specific about the start of her symptoms but they have been getting worse despite using her medicines.  She denies exacerbation with eating or position such as supine position and relates her pain more to stressful situations citing that she is going through a divorce, is living with her daughter b/c she has nowhere else to live and she is unhappy with her job.  The pain is a burning pain.    Patient is a 52 y.o. female presenting with abdominal pain. The history is provided by the patient and medical records.  Abdominal Pain   Past Medical History  Diagnosis Date  . Allergic rhinitis   . COPD (chronic obstructive pulmonary disease)   . Hypothyroidism   . Excessive or frequent menstruation   . History of migraines   . Pure hypercholesterolemia   . Unspecified adjustment reaction   . Nonspecific abnormal electrocardiogram (ECG) (EKG)   . Tobacco use disorder     Past Surgical History  Procedure Laterality Date  . Hammer toe surgery    . Pelvic laparoscopy    . Tubal ligation      Family History  Problem Relation Age of Onset  . Depression Mother     ?  Marland Kitchen Other Father     No contact--unknown    History  Substance Use Topics  . Smoking status: Current Every Day Smoker  . Smokeless tobacco: Not on file  . Alcohol Use: Yes    OB History   Grav Para Term Preterm Abortions TAB SAB Ect Mult Living                   Review of Systems  Gastrointestinal: Positive for abdominal pain.  All other systems reviewed and are negative.    Allergies  Review of patient's allergies indicates no known allergies.  Home Medications   Current Outpatient Rx  Name  Route  Sig  Dispense  Refill  . acetaminophen (TYLENOL) 500 MG tablet   Oral   Take 1,500 mg by mouth every 6 (six) hours as needed for pain.         . Acetaminophen-Codeine (TYLENOL/CODEINE #3) 300-30 MG per tablet   Oral   Take 1 tablet by mouth every 4 (four) hours as needed for pain.   30 tablet   0   . albuterol (PROVENTIL HFA;VENTOLIN HFA) 108 (90 BASE) MCG/ACT inhaler   Inhalation   Inhale 2 puffs into the lungs every 6 (six) hours as needed for wheezing.         . Cholecalciferol (VITAMIN D) 1000 UNITS capsule   Oral   Take 1,000 Units by mouth daily.           Marland Kitchen levothyroxine (SYNTHROID, LEVOTHROID) 100 MCG tablet   Oral   Take 100 mcg by mouth daily.         . Omega-3 Fatty Acids (FISH OIL) 1000  MG CAPS   Oral   Take 1 capsule by mouth daily.           Marland Kitchen omeprazole (PRILOSEC) 20 MG capsule   Oral   Take 1 capsule (20 mg total) by mouth daily.   30 capsule   1   . ranitidine (ZANTAC) 150 MG tablet   Oral   Take 1 tablet (150 mg total) by mouth 2 (two) times daily.   60 tablet   1   . sucralfate (CARAFATE) 1 G tablet   Oral   Take 1 tablet (1 g total) by mouth 4 (four) times daily.   40 tablet   0     BP 138/79  Pulse 84  Temp(Src) 97.4 F (36.3 C) (Oral)  Resp 16  SpO2 100%  LMP 04/06/2012  Physical Exam  Nursing note and vitals reviewed. Constitutional: She appears well-developed and well-nourished. No distress.  HENT:  Head: Normocephalic and atraumatic.  Mouth/Throat: Oropharynx is clear and moist. No oropharyngeal exudate.  Eyes: Conjunctivae and EOM are normal. Pupils are equal, round, and reactive to light. Right eye exhibits no discharge. Left eye exhibits no discharge. No scleral  icterus.  Neck: Normal range of motion. Neck supple. No JVD present. No thyromegaly present.  Cardiovascular: Normal rate, regular rhythm, normal heart sounds and intact distal pulses.  Exam reveals no gallop and no friction rub.   No murmur heard. Pulmonary/Chest: Effort normal and breath sounds normal. No respiratory distress. She has no wheezes. She has no rales.  Abdominal: Soft. Bowel sounds are normal. She exhibits no distension and no mass. There is no hepatosplenomegaly. There is tenderness in the epigastric area. There is no rigidity, no rebound, no guarding, no tenderness at McBurney's point and negative Murphy's sign. No hernia.  Musculoskeletal: Normal range of motion. She exhibits no edema and no tenderness.  Lymphadenopathy:    She has no cervical adenopathy.  Neurological: She is alert. Coordination normal.  Skin: Skin is warm and dry. No rash noted. No erythema.  Psychiatric: She has a normal mood and affect. Her behavior is normal.    ED Course  Procedures (including critical care time)  Labs Reviewed  COMPREHENSIVE METABOLIC PANEL - Abnormal; Notable for the following:    Total Bilirubin 0.1 (*)    GFR calc non Af Amer 77 (*)    GFR calc Af Amer 90 (*)    All other components within normal limits  CBC WITH DIFFERENTIAL  LIPASE, BLOOD  URINALYSIS, ROUTINE W REFLEX MICROSCOPIC   Dg Abd Acute W/chest  08/30/2012  *RADIOLOGY REPORT*  Clinical Data: Epigastric abdominal pain, nausea  ACUTE ABDOMEN SERIES (ABDOMEN 2 VIEW & CHEST 1 VIEW)  Comparison: CT abdomen pelvis of 05/29/2012 and to a chest x-ray of 05/06/2007  Findings: The lungs are clear but hyperaerated.  There is some peribronchial thickening which may indicate bronchitis. Mediastinal contours appear normal.  The heart is within normal limits in size.  Supine and erect views of the abdomen show no bowel obstruction and no free air.  Artifacts overlie the pelvis.  No opaque calculi are seen.  The bones are  unremarkable.  IMPRESSION:  1.  Hyperaeration.  Probable bronchitis.  No active process. 2.  No bowel obstruction.  No free air   Original Report Authenticated By: Dwyane Dee, M.D.      1. Peptic ulcer disease       MDM  The pt has had improvement immediately withi GI cocktail - pain then rebounded  but has improved significantly - labs overall unremarkable - she will be refferred to GI for endoscopy and until that time will start prilosec in addition to H2 and Carafate.          Vida Roller, MD 08/30/12 [phone]

## 2012-08-30 NOTE — ED Notes (Signed)
Patient transported to X-ray 

## 2012-08-30 NOTE — ED Notes (Signed)
MD at bedside. 

## 2012-10-03 ENCOUNTER — Emergency Department (HOSPITAL_COMMUNITY)
Admission: EM | Admit: 2012-10-03 | Discharge: 2012-10-04 | Disposition: A | Discharge status: Home / Self Care | Payer: Worker's Compensation | Attending: Emergency Medicine | Admitting: Emergency Medicine

## 2012-10-03 ENCOUNTER — Encounter (HOSPITAL_COMMUNITY): Payer: Self-pay | Admitting: Emergency Medicine

## 2012-10-03 DIAGNOSIS — J449 Chronic obstructive pulmonary disease, unspecified: Secondary | ICD-10-CM | POA: Insufficient documentation

## 2012-10-03 DIAGNOSIS — Y929 Unspecified place or not applicable: Secondary | ICD-10-CM | POA: Insufficient documentation

## 2012-10-03 DIAGNOSIS — W57XXXA Bitten or stung by nonvenomous insect and other nonvenomous arthropods, initial encounter: Secondary | ICD-10-CM

## 2012-10-03 DIAGNOSIS — S1096XA Insect bite of unspecified part of neck, initial encounter: Secondary | ICD-10-CM | POA: Insufficient documentation

## 2012-10-03 DIAGNOSIS — Z79899 Other long term (current) drug therapy: Secondary | ICD-10-CM | POA: Insufficient documentation

## 2012-10-03 DIAGNOSIS — J4489 Other specified chronic obstructive pulmonary disease: Secondary | ICD-10-CM | POA: Insufficient documentation

## 2012-10-03 DIAGNOSIS — Z8742 Personal history of other diseases of the female genital tract: Secondary | ICD-10-CM | POA: Insufficient documentation

## 2012-10-03 DIAGNOSIS — E7800 Pure hypercholesterolemia, unspecified: Secondary | ICD-10-CM | POA: Insufficient documentation

## 2012-10-03 DIAGNOSIS — F17200 Nicotine dependence, unspecified, uncomplicated: Secondary | ICD-10-CM | POA: Insufficient documentation

## 2012-10-03 DIAGNOSIS — Z23 Encounter for immunization: Secondary | ICD-10-CM | POA: Insufficient documentation

## 2012-10-03 DIAGNOSIS — Y939 Activity, unspecified: Secondary | ICD-10-CM | POA: Insufficient documentation

## 2012-10-03 DIAGNOSIS — E039 Hypothyroidism, unspecified: Secondary | ICD-10-CM | POA: Insufficient documentation

## 2012-10-03 DIAGNOSIS — Z8679 Personal history of other diseases of the circulatory system: Secondary | ICD-10-CM | POA: Insufficient documentation

## 2012-10-03 DIAGNOSIS — Z8659 Personal history of other mental and behavioral disorders: Secondary | ICD-10-CM | POA: Insufficient documentation

## 2012-10-03 MED ORDER — TETANUS-DIPHTH-ACELL PERTUSSIS 5-2.5-18.5 LF-MCG/0.5 IM SUSP
0.5000 mL | Freq: Once | INTRAMUSCULAR | Status: AC
  Administered 2012-10-03: 0.5 mL via INTRAMUSCULAR
  Filled 2012-10-03: qty 0.5

## 2012-10-03 MED ORDER — CEPHALEXIN 500 MG PO CAPS
1000.0000 mg | ORAL_CAPSULE | Freq: Once | ORAL | Status: AC
  Administered 2012-10-03: 1000 mg via ORAL
  Filled 2012-10-03: qty 2

## 2012-10-03 MED ORDER — CEPHALEXIN 500 MG PO CAPS: 500.0000 mg | ORAL_CAPSULE | Freq: Two times a day (BID) | ORAL | Status: DC

## 2012-10-03 NOTE — ED Notes (Signed)
Pt states she removed tick from R ear @ 0330 am. Area red with some swelling noted.

## 2012-10-03 NOTE — ED Provider Notes (Signed)
History    This chart was scribed for non-physician practitioner Wynetta Emery PA-C working with Derwood Kaplan, MD by Smitty Pluck, ED scribe. This patient was seen in room WTR5/WTR5 and the patient's care was started at 11:10 PM.   CSN: [id number]  Arrival date & time 10/03/12  2131        Chief Complaint  Patient presents with  . Insect Bite     The history is provided by the patient. No language interpreter was used.   Lauren Beck is a 52 y.o. female who presents to the Emergency Department complaining of insect bite on right ear today. Pt reports that she removed the insect today at 3:30AM. She reports having mild swelling and mild redness in area of tick bite. She states that symptoms have worsened since she removed the bug. Pt denies fever, chills, nausea, vomiting, diarrhea, weakness, cough, SOB and any other pain. Her tetanus was last updated in 1997.   Past Medical History  Diagnosis Date  . Allergic rhinitis   . COPD (chronic obstructive pulmonary disease)   . Hypothyroidism   . Excessive or frequent menstruation   . History of migraines   . Pure hypercholesterolemia   . Unspecified adjustment reaction   . Nonspecific abnormal electrocardiogram (ECG) (EKG)   . Tobacco use disorder     Past Surgical History  Procedure Laterality Date  . Hammer toe surgery    . Pelvic laparoscopy    . Tubal ligation      Family History  Problem Relation Age of Onset  . Depression Mother     ?  Marland Kitchen Other Father     No contact--unknown    History  Substance Use Topics  . Smoking status: Current Every Day Smoker  . Smokeless tobacco: Not on file  . Alcohol Use: Yes    OB History   Grav Para Term Preterm Abortions TAB SAB Ect Mult Living                  Review of Systems  Constitutional: Negative for fever.  Respiratory: Negative for shortness of breath.   Cardiovascular: Negative for chest pain.  Gastrointestinal: Negative for nausea, vomiting, abdominal pain  and diarrhea.  All other systems reviewed and are negative.    Allergies  Review of patient's allergies indicates no known allergies.  Home Medications   Current Outpatient Rx  Name  Route  Sig  Dispense  Refill  . acetaminophen (TYLENOL) 500 MG tablet   Oral   Take 1,500 mg by mouth every 6 (six) hours as needed for pain.         . Acetaminophen-Codeine (TYLENOL/CODEINE #3) 300-30 MG per tablet   Oral   Take 1 tablet by mouth every 4 (four) hours as needed for pain.   30 tablet   0   . albuterol (PROVENTIL HFA;VENTOLIN HFA) 108 (90 BASE) MCG/ACT inhaler   Inhalation   Inhale 2 puffs into the lungs every 6 (six) hours as needed for wheezing.         . Cholecalciferol (VITAMIN D) 1000 UNITS capsule   Oral   Take 1,000 Units by mouth daily.           Marland Kitchen levothyroxine (SYNTHROID, LEVOTHROID) 100 MCG tablet   Oral   Take 100 mcg by mouth daily.         . Omega-3 Fatty Acids (FISH OIL) 1000 MG CAPS   Oral   Take 1 capsule by mouth daily.           Marland Kitchen  omeprazole (PRILOSEC) 20 MG capsule   Oral   Take 1 capsule (20 mg total) by mouth daily.   30 capsule   1   . ranitidine (ZANTAC) 150 MG tablet   Oral   Take 1 tablet (150 mg total) by mouth 2 (two) times daily.   60 tablet   1   . sucralfate (CARAFATE) 1 G tablet   Oral   Take 1 tablet (1 g total) by mouth 4 (four) times daily.   40 tablet   0     BP 96/71  Pulse 95  Temp(Src) 98.6 F (37 C) (Oral)  Resp 19  Ht 5\' 1"  (1.549 m)  Wt 105 lb (47.628 kg)  BMI 19.85 kg/m2  SpO2 97%  LMP 04/06/2012  Physical Exam  Nursing note and vitals reviewed. Constitutional: She is oriented to person, place, and time. She appears well-developed and well-nourished. No distress.  HENT:  Head: Normocephalic.  Eyes: Conjunctivae and EOM are normal.  Cardiovascular: Normal rate.   Pulmonary/Chest: Effort normal. No stridor.  Musculoskeletal: Normal range of motion.  Neurological: She is alert and oriented to  person, place, and time.  Skin:  Mild erythema to right ear, no swelling, induration, discharge, tenderness to palpation, or retained mouthparts.  Psychiatric: She has a normal mood and affect.    ED Course  Procedures (including critical care time) DIAGNOSTIC STUDIES: Oxygen Saturation is 97% on room air, normal by my interpretation.    COORDINATION OF CARE: 11:13 PM Discussed ED treatment with pt and pt agrees.     Labs Reviewed - No data to display No results found.   1. Insect bite       MDM   Lauren Beck is a 52 y.o. female states she was bitten by a tick on the right year, however I doubt this because she states that she simply brushed the insect away and it fell from her ear, there is no indication of retained injuries or mouthparts in the affected area, no signs of infection. Antibiotics given prophylactically.    Filed Vitals:   10/03/12 2148  BP: 96/71  Pulse: 95  Temp: 98.6 F (37 C)  TempSrc: Oral  Resp: 19  Height: 5\' 1"  (1.549 m)  Weight: 105 lb (47.628 kg)  SpO2: 97%     Pt verbalized understanding and agrees with care plan. Outpatient follow-up and return precautions given.    Discharge Medication List as of 10/03/2012 11:18 PM    START taking these medications   Details  cephALEXin (KEFLEX) 500 MG capsule Take 1 capsule (500 mg total) by mouth 2 (two) times daily., Starting 10/03/2012, Until Discontinued, Print          Wynetta Emery, PA-C 10/07/12 1643  Medical screening examination/treatment/procedure(s) were performed by non-physician practitioner and as supervising physician I was immediately available for consultation/collaboration.   Derwood Kaplan, MD 10/08/12 2300

## 2014-05-05 ENCOUNTER — Emergency Department (HOSPITAL_COMMUNITY): Payer: Self-pay

## 2014-05-05 ENCOUNTER — Encounter (HOSPITAL_COMMUNITY): Payer: Self-pay | Admitting: Emergency Medicine

## 2014-05-05 ENCOUNTER — Emergency Department (HOSPITAL_COMMUNITY)
Admission: EM | Admit: 2014-05-05 | Discharge: 2014-05-05 | Disposition: A | Discharge status: Home / Self Care | Payer: Self-pay | Attending: Emergency Medicine | Admitting: Emergency Medicine

## 2014-05-05 DIAGNOSIS — K279 Peptic ulcer, site unspecified, unspecified as acute or chronic, without hemorrhage or perforation: Secondary | ICD-10-CM | POA: Insufficient documentation

## 2014-05-05 DIAGNOSIS — Z87448 Personal history of other diseases of urinary system: Secondary | ICD-10-CM | POA: Insufficient documentation

## 2014-05-05 DIAGNOSIS — Z79899 Other long term (current) drug therapy: Secondary | ICD-10-CM | POA: Insufficient documentation

## 2014-05-05 DIAGNOSIS — Z8679 Personal history of other diseases of the circulatory system: Secondary | ICD-10-CM | POA: Insufficient documentation

## 2014-05-05 DIAGNOSIS — J449 Chronic obstructive pulmonary disease, unspecified: Secondary | ICD-10-CM | POA: Insufficient documentation

## 2014-05-05 DIAGNOSIS — Z8659 Personal history of other mental and behavioral disorders: Secondary | ICD-10-CM | POA: Insufficient documentation

## 2014-05-05 DIAGNOSIS — E039 Hypothyroidism, unspecified: Secondary | ICD-10-CM | POA: Insufficient documentation

## 2014-05-05 DIAGNOSIS — Z720 Tobacco use: Secondary | ICD-10-CM | POA: Insufficient documentation

## 2014-05-05 LAB — URINALYSIS, ROUTINE W REFLEX MICROSCOPIC
BILIRUBIN URINE: NEGATIVE
Glucose, UA: NEGATIVE mg/dL
Hgb urine dipstick: NEGATIVE
KETONES UR: NEGATIVE mg/dL
Leukocytes, UA: NEGATIVE
NITRITE: NEGATIVE
Protein, ur: NEGATIVE mg/dL
SPECIFIC GRAVITY, URINE: 1.022 (ref 1.005–1.030)
UROBILINOGEN UA: 0.2 mg/dL (ref 0.0–1.0)
pH: 6 (ref 5.0–8.0)

## 2014-05-05 LAB — CBC WITH DIFFERENTIAL/PLATELET
BASOS PCT: 0 % (ref 0–1)
Basophils Absolute: 0 10*3/uL (ref 0.0–0.1)
EOS PCT: 0 % (ref 0–5)
Eosinophils Absolute: 0 10*3/uL (ref 0.0–0.7)
HEMATOCRIT: 42.1 % (ref 36.0–46.0)
HEMOGLOBIN: 14.2 g/dL (ref 12.0–15.0)
Lymphocytes Relative: 22 % (ref 12–46)
Lymphs Abs: 2.2 10*3/uL (ref 0.7–4.0)
MCH: 34.1 pg — AB (ref 26.0–34.0)
MCHC: 33.7 g/dL (ref 30.0–36.0)
MCV: 101 fL — AB (ref 78.0–100.0)
MONO ABS: 0.4 10*3/uL (ref 0.1–1.0)
MONOS PCT: 4 % (ref 3–12)
NEUTROS ABS: 7.4 10*3/uL (ref 1.7–7.7)
Neutrophils Relative %: 74 % (ref 43–77)
Platelets: 250 10*3/uL (ref 150–400)
RBC: 4.17 MIL/uL (ref 3.87–5.11)
RDW: 12.8 % (ref 11.5–15.5)
WBC: 10 10*3/uL (ref 4.0–10.5)

## 2014-05-05 LAB — COMPREHENSIVE METABOLIC PANEL
ALBUMIN: 3.9 g/dL (ref 3.5–5.2)
ALT: 9 U/L (ref 0–35)
ANION GAP: 13 (ref 5–15)
AST: 15 U/L (ref 0–37)
Alkaline Phosphatase: 87 U/L (ref 39–117)
BILIRUBIN TOTAL: 0.3 mg/dL (ref 0.3–1.2)
BUN: 17 mg/dL (ref 6–23)
CALCIUM: 9.8 mg/dL (ref 8.4–10.5)
CHLORIDE: 101 meq/L (ref 96–112)
CO2: 24 mEq/L (ref 19–32)
CREATININE: 0.99 mg/dL (ref 0.50–1.10)
GFR calc Af Amer: 75 mL/min — ABNORMAL LOW (ref >90)
GFR, EST NON AFRICAN AMERICAN: 64 mL/min — AB (ref >90)
Glucose, Bld: 93 mg/dL (ref 70–99)
Potassium: 4.6 mEq/L (ref 3.7–5.3)
Sodium: 138 mEq/L (ref 137–147)
Total Protein: 7.4 g/dL (ref 6.0–8.3)

## 2014-05-05 LAB — LIPASE, BLOOD: LIPASE: 16 U/L (ref 11–59)

## 2014-05-05 MED ORDER — ESOMEPRAZOLE MAGNESIUM 40 MG PO CPDR: 40.0000 mg | DELAYED_RELEASE_CAPSULE | Freq: Every day | ORAL | Status: DC

## 2014-05-05 NOTE — ED Notes (Signed)
Pt states that she has had RLQ pain for 2 weeks with n/v/d.  Pt states that she has a bitter taste in her mouth and doesn't know if its related to vomit or blood.  Pt has stomach ulcers.

## 2014-05-05 NOTE — ED Notes (Signed)
Patient aware that a urine sample is needed, patient unable to urinate at this time. Patient will let staff know when they are abel to urinate.

## 2014-05-05 NOTE — ED Notes (Signed)
Pt unable to given urine sample at this time. Urine collection at bs

## 2014-05-05 NOTE — ED Provider Notes (Signed)
CSN: [id number]     Arrival date & time 05/05/14  0813 History   First MD Initiated Contact with Patient 05/05/14 0845     Chief Complaint  Patient presents with  . bitter taste   . Abdominal Pain  . Emesis     (Consider location/radiation/quality/duration/timing/severity/associated sxs/prior Treatment) Patient is a 53 y.o. female presenting with abdominal pain and vomiting. The history is provided by the patient.  Abdominal Pain Associated symptoms: vomiting   Emesis Associated symptoms: abdominal pain    She complains of right upper quadrant abdominal pain, for 2 weeks associated with eating.  She has nausea and "dry heaves" but no vomiting.  She has occasional diarrhea for several days.  Stool is brown in color.  She denies fever, chills, cough, shortness of breath, chest pain, weakness or dizziness.  She's never had a similar problem in the past. She is using over-the-counter anti-emetics, and Pepto-Bismol, without relief.  There are no other known modifying factors.  Past Medical History  Diagnosis Date  . Allergic rhinitis   . COPD (chronic obstructive pulmonary disease)   . Hypothyroidism   . Excessive or frequent menstruation   . History of migraines   . Pure hypercholesterolemia   . Unspecified adjustment reaction   . Nonspecific abnormal electrocardiogram (ECG) (EKG)   . Tobacco use disorder    Past Surgical History  Procedure Laterality Date  . Hammer toe surgery    . Pelvic laparoscopy    . Tubal ligation     Family History  Problem Relation Age of Onset  . Depression Mother     ?  Marland Kitchen. Other Father     No contact--unknown   History  Substance Use Topics  . Smoking status: Current Every Day Smoker  . Smokeless tobacco: Not on file  . Alcohol Use: Yes   OB History    No data available     Review of Systems  Gastrointestinal: Positive for vomiting and abdominal pain.  All other systems reviewed and are negative.     Allergies  Benadryl  Home  Medications   Prior to Admission medications   Medication Sig Start Date End Date Taking? Authorizing Provider  acetaminophen (TYLENOL) 500 MG tablet Take 1,500 mg by mouth every 6 (six) hours as needed for mild pain or headache.    Yes Historical Provider, MD  albuterol (PROVENTIL HFA;VENTOLIN HFA) 108 (90 BASE) MCG/ACT inhaler Inhale 2 puffs into the lungs every 6 (six) hours as needed for wheezing.   Yes Historical Provider, MD  anti-nausea (EMETROL) solution Take 10 mLs by mouth every 15 (fifteen) minutes as needed for nausea or vomiting.   Yes Historical Provider, MD  bismuth subsalicylate (PEPTO BISMOL) 262 MG/15ML suspension Take 15 mLs by mouth every 6 (six) hours as needed for indigestion or diarrhea or loose stools.   Yes Historical Provider, MD  Multiple Vitamin (MULTIVITAMIN WITH MINERALS) TABS tablet Take 1 tablet by mouth daily.   Yes Historical Provider, MD  levothyroxine (SYNTHROID, LEVOTHROID) 100 MCG tablet Take 100 mcg by mouth daily. 02/04/12 02/03/13  Judy PimpleMarne A Tower, MD   BP 112/75 mmHg  Pulse 92  Temp(Src) 98.3 F (36.8 C) (Oral)  Resp 16  SpO2 98%  LMP 04/06/2012 Physical Exam  Constitutional: She is oriented to person, place, and time. She appears well-developed and well-nourished.  HENT:  Head: Normocephalic and atraumatic.  Right Ear: External ear normal.  Left Ear: External ear normal.  Eyes: Conjunctivae and EOM are normal. Pupils are  equal, round, and reactive to light.  Neck: Normal range of motion and phonation normal. Neck supple.  Cardiovascular: Normal rate, regular rhythm and normal heart sounds.   Pulmonary/Chest: Effort normal and breath sounds normal. She exhibits no bony tenderness.  Abdominal: Soft. She exhibits no mass. There is tenderness (right upper quadrant, mild.). There is no rebound and no guarding.  Musculoskeletal: Normal range of motion.  Neurological: She is alert and oriented to person, place, and time. No cranial nerve deficit or  sensory deficit. She exhibits normal muscle tone. Coordination normal.  Skin: Skin is warm, dry and intact.  Psychiatric: She has a normal mood and affect. Her behavior is normal. Judgment and thought content normal.  Nursing note and vitals reviewed.   ED Course  Procedures (including critical care time)  09:15- evaluation initiated for gallbladder disease.  Patient declined analgesia, at this time.  Medications - No data to display  Patient Vitals for the past 24 hrs:  BP Temp Temp src Pulse Resp SpO2  05/05/14 0829 112/75 mmHg 98.3 F (36.8 C) Oral 92 16 98 %    11:30 AM Reevaluation with update and discussion. After initial assessment and treatment, an updated evaluation reveals she remains comfortable, no additional complaints.  Findings discussed with patient, all questions answered. Suhayb Anzalone L    Labs Review Labs Reviewed  CBC WITH DIFFERENTIAL - Abnormal; Notable for the following:    MCV 101.0 (*)    MCH 34.1 (*)    All other components within normal limits  COMPREHENSIVE METABOLIC PANEL  LIPASE, BLOOD  URINALYSIS, ROUTINE W REFLEX MICROSCOPIC    Imaging Review No results found.   EKG Interpretation None      MDM   Final diagnoses:  PUD (peptic ulcer disease)    Nonspecific upper abdominal pain and vomiting consistent with gastritis versus peptic ulcer disease.  No evidence for acute metabolic abnormality, or persistent serious symptoms that would require hospitalization.   Nursing Notes Reviewed/ Care Coordinated Applicable Imaging Reviewed Interpretation of Laboratory Data incorporated into ED treatment  The patient appears reasonably screened and/or stabilized for discharge and I doubt any other medical condition or other [facility] requiring further screening, evaluation, or treatment in the ED at this time prior to discharge.  Plan: Home Medications- Nexium; Home Treatments- rest; return here if the recommended treatment, does not improve the  symptoms; Recommended follow up- GI 1 week, PCP prn     Flint MelterElliott L Seng Larch, MD 05/05/14 1133

## 2014-05-05 NOTE — Discharge Instructions (Signed)
You can also use Maalox or Mylanta before meals and at bedtime, to help with your symptoms.    Peptic Ulcer A peptic ulcer is a sore in the lining of your esophagus (esophageal ulcer), stomach (gastric ulcer), or in the first part of your small intestine (duodenal ulcer). The ulcer causes erosion into the deeper tissue. CAUSES  Normally, the lining of the stomach and the small intestine protects itself from the acid that digests food. The protective lining can be damaged by:  An infection caused by a bacterium called Helicobacter pylori (H. pylori).  Regular use of nonsteroidal anti-inflammatory drugs (NSAIDs), such as ibuprofen or aspirin.  Smoking tobacco. Other risk factors include being older than 50, drinking alcohol excessively, and having a family history of ulcer disease.  SYMPTOMS   Burning pain or gnawing in the area between the chest and the belly button.  Heartburn.  Nausea and vomiting.  Bloating. The pain can be worse on an empty stomach and at night. If the ulcer results in bleeding, it can cause:  Black, tarry stools.  Vomiting of bright red blood.  Vomiting of coffee-ground-looking materials. DIAGNOSIS  A diagnosis is usually made based upon your history and an exam. Other tests and procedures may be performed to find the cause of the ulcer. Finding a cause will help determine the best treatment. Tests and procedures may include:  Blood tests, stool tests, or breath tests to check for the bacterium H. pylori.  An upper gastrointestinal (GI) series of the esophagus, stomach, and small intestine.  An endoscopy to examine the esophagus, stomach, and small intestine.  A biopsy. TREATMENT  Treatment may include:  Eliminating the cause of the ulcer, such as smoking, NSAIDs, or alcohol.  Medicines to reduce the amount of acid in your digestive tract.  Antibiotic medicines if the ulcer is caused by the H. pylori bacterium.  An upper endoscopy to treat a  bleeding ulcer.  Surgery if the bleeding is severe or if the ulcer created a hole somewhere in the digestive system. HOME CARE INSTRUCTIONS   Avoid tobacco, alcohol, and caffeine. Smoking can increase the acid in the stomach, and continued smoking will impair the healing of ulcers.  Avoid foods and drinks that seem to cause discomfort or aggravate your ulcer.  Only take medicines as directed by your caregiver. Do not substitute over-the-counter medicines for prescription medicines without talking to your caregiver.  Keep any follow-up appointments and tests as directed. SEEK MEDICAL CARE IF:   Your do not improve within 7 days of starting treatment.  You have ongoing indigestion or heartburn. SEEK IMMEDIATE MEDICAL CARE IF:   You have sudden, sharp, or persistent abdominal pain.  You have bloody or dark black, tarry stools.  You vomit blood or vomit that looks like coffee grounds.  You become light-headed, weak, or feel faint.  You become sweaty or clammy. MAKE SURE YOU:   Understand these instructions.  Will watch your condition.  Will get help right away if you are not doing well or get worse. Document Released: 06/06/2000 Document Revised: 10/24/2013 Document Reviewed: 01/07/2012 [facility] Patient Information 2015 [place], [place]. This information is not intended to replace advice given to you by your health care provider. Make sure you discuss any questions you have with your health care provider.

## 2014-07-21 ENCOUNTER — Emergency Department (INDEPENDENT_AMBULATORY_CARE_PROVIDER_SITE_OTHER)
Admission: EM | Admit: 2014-07-21 | Discharge: 2014-07-21 | Disposition: A | Discharge status: Nursing Home (Includes ICF) | Payer: Self-pay | Source: Home / Self Care | Attending: Family Medicine | Admitting: Family Medicine

## 2014-07-21 ENCOUNTER — Encounter (HOSPITAL_COMMUNITY): Payer: Self-pay | Admitting: Emergency Medicine

## 2014-07-21 DIAGNOSIS — R109 Unspecified abdominal pain: Secondary | ICD-10-CM

## 2014-07-21 DIAGNOSIS — R100 Acute abdomen: Secondary | ICD-10-CM

## 2014-07-21 NOTE — ED Provider Notes (Signed)
CSN: [id number]     Arrival date & time 07/21/14  1814 History   First MD Initiated Contact with Patient 07/21/14 1858     Chief Complaint  Patient presents with  . Diarrhea  . Fever  . Cough  . Chills   (Consider location/radiation/quality/duration/timing/severity/associated sxs/prior Treatment) HPI Comments: 54 year old female awoke this morning and had 4 bouts of loose stools. She worked most of the day and then went to a funeral following her they have employment around 8:00. Since that time she has had 6-7 episodes of vomiting and dry heaving where she is unable to bring up anything or just yellow stomach contents. She has had little to no by mouth intake today she is also noticed chills. She is complaining of abdominal pain primarily around the umbilicus. She has a history of PUD and is been to the emergency department for this 2-3 times in the past year and a half. She has not followed up with her gastroenterologist as referred due to inability to find her insurance card. She presents with severe abdominal pain and tenderness.   Past Medical History  Diagnosis Date  . Allergic rhinitis   . COPD (chronic obstructive pulmonary disease)   . Hypothyroidism   . Excessive or frequent menstruation   . History of migraines   . Pure hypercholesterolemia   . Unspecified adjustment reaction   . Nonspecific abnormal electrocardiogram (ECG) (EKG)   . Tobacco use disorder    Past Surgical History  Procedure Laterality Date  . Hammer toe surgery    . Pelvic laparoscopy    . Tubal ligation     Family History  Problem Relation Age of Onset  . Depression Mother     ?  Marland Kitchen Other Father     No contact--unknown   History  Substance Use Topics  . Smoking status: Current Every Day Smoker  . Smokeless tobacco: Not on file  . Alcohol Use: Yes   OB History    No data available     Review of Systems  Constitutional: Positive for chills, activity change and appetite change. Negative for  fever.  HENT: Negative.   Respiratory: Positive for cough.        Later in the day the patient developed runny nose and cough. She does have a history of COPD and is a smoker.  Cardiovascular: Negative for chest pain.  Gastrointestinal: Positive for nausea, vomiting, abdominal pain and diarrhea. Negative for constipation and abdominal distention.  Genitourinary: Negative.   Musculoskeletal: Negative.   Skin: Negative.   Neurological: Negative.     Allergies  Benadryl  Home Medications   Prior to Admission medications   Medication Sig Start Date End Date Taking? Authorizing Provider  acetaminophen (TYLENOL) 500 MG tablet Take 1,500 mg by mouth every 6 (six) hours as needed for mild pain or headache.     Historical Provider, MD  albuterol (PROVENTIL HFA;VENTOLIN HFA) 108 (90 BASE) MCG/ACT inhaler Inhale 2 puffs into the lungs every 6 (six) hours as needed for wheezing.    Historical Provider, MD  anti-nausea (EMETROL) solution Take 10 mLs by mouth every 15 (fifteen) minutes as needed for nausea or vomiting.    Historical Provider, MD  bismuth subsalicylate (PEPTO BISMOL) 262 MG/15ML suspension Take 15 mLs by mouth every 6 (six) hours as needed for indigestion or diarrhea or loose stools.    Historical Provider, MD  esomeprazole (NEXIUM) 40 MG capsule Take 1 capsule (40 mg total) by mouth daily. 05/05/14  Flint MelterElliott L Wentz, MD  levothyroxine (SYNTHROID, LEVOTHROID) 100 MCG tablet Take 100 mcg by mouth daily. 02/04/12 02/03/13  Judy PimpleMarne A Tower, MD  Multiple Vitamin (MULTIVITAMIN WITH MINERALS) TABS tablet Take 1 tablet by mouth daily.    Historical Provider, MD   BP 99/68 mmHg  Pulse 90  Temp(Src) 99.5 F (37.5 C) (Oral)  Resp 16  SpO2 97%  LMP 04/06/2012 Physical Exam  Constitutional: She is oriented to person, place, and time. She appears well-developed and well-nourished. No distress.  HENT:  Mouth/Throat: No oropharyngeal exudate.  Eyes: Conjunctivae and EOM are normal.  Neck:  Normal range of motion. Neck supple.  Cardiovascular: Normal rate, regular rhythm and normal heart sounds.   Pulmonary/Chest: Effort normal and breath sounds normal. No respiratory distress. She has no wheezes. She has no rales.  Abdominal:  Abdomen flat. Hyperactive bowel sounds. Severe tenderness in all quadrants with intermittent rebound and guarding. The patient rates the pain as 8 out of 10 in the tenderness greater than 10.  Lymphadenopathy:    She has no cervical adenopathy.  Neurological: She is alert and oriented to person, place, and time. She exhibits normal muscle tone.  Skin: Skin is warm and dry.  Psychiatric: She has a normal mood and affect.  Nursing note and vitals reviewed.   ED Course  Procedures (including critical care time) Labs Review Labs Reviewed - No data to display  Imaging Review No results found.   MDM   1. Sudden onset of severe abdominal pain    Patient is being transferred via shuttle to the emergency department for sudden acute generalized abdominal pain rated 10 out of 10. In addition she has a mildly elevated temperature of 99.5. Certainly she could have a viral gastroenteritis but with her history of epigastric and upper GI pain with ED visits and no follow-up with GI must consider other etiology.    Hayden Rasmussenavid Shandria Clinch, NP 07/21/14 [phone]

## 2014-07-21 NOTE — ED Notes (Signed)
Pt states that she has had diarrhea, productive cough, along with chills and nausea.

## 2014-07-21 NOTE — ED Notes (Signed)
Pt will be transferred to the [facility]. Report called to 1st nurse Thayer Ohmhris. Pt will be transferred via shuttle.

## 2019-09-25 ENCOUNTER — Other Ambulatory Visit: Payer: Self-pay

## 2019-09-25 ENCOUNTER — Encounter (HOSPITAL_COMMUNITY): Payer: Self-pay | Admitting: Emergency Medicine

## 2019-09-25 ENCOUNTER — Emergency Department (HOSPITAL_COMMUNITY)
Admission: EM | Admit: 2019-09-25 | Discharge: 2019-09-25 | Disposition: A | Discharge status: Home / Self Care | Payer: Self-pay | Attending: Emergency Medicine | Admitting: Emergency Medicine

## 2019-09-25 ENCOUNTER — Emergency Department (HOSPITAL_COMMUNITY): Payer: Self-pay

## 2019-09-25 DIAGNOSIS — Z20822 Contact with and (suspected) exposure to covid-19: Secondary | ICD-10-CM | POA: Insufficient documentation

## 2019-09-25 DIAGNOSIS — Z79899 Other long term (current) drug therapy: Secondary | ICD-10-CM | POA: Insufficient documentation

## 2019-09-25 DIAGNOSIS — J069 Acute upper respiratory infection, unspecified: Secondary | ICD-10-CM | POA: Insufficient documentation

## 2019-09-25 DIAGNOSIS — E039 Hypothyroidism, unspecified: Secondary | ICD-10-CM | POA: Insufficient documentation

## 2019-09-25 DIAGNOSIS — J449 Chronic obstructive pulmonary disease, unspecified: Secondary | ICD-10-CM | POA: Insufficient documentation

## 2019-09-25 DIAGNOSIS — F17210 Nicotine dependence, cigarettes, uncomplicated: Secondary | ICD-10-CM | POA: Insufficient documentation

## 2019-09-25 MED ORDER — BENZONATATE 100 MG PO CAPS: 100.0000 mg | ORAL_CAPSULE | Freq: Three times a day (TID) | ORAL | 0 refills | Status: DC | PRN

## 2019-09-25 MED ORDER — PREDNISONE 50 MG PO TABS: 50.0000 mg | ORAL_TABLET | Freq: Every day | ORAL | 0 refills | Status: AC

## 2019-09-25 NOTE — ED Notes (Signed)
Gerre Pebbles, PA at bedside.

## 2019-09-25 NOTE — ED Triage Notes (Signed)
Pt reports having sinus pressure and headache with runny nose and cough. Pt denies any exposure to COVID. Pt states symptoms began 3 days ago.

## 2019-09-25 NOTE — ED Provider Notes (Signed)
[place] DEPT Provider Note   CSN: [id number] Arrival date & time: 09/25/19  1910     History Chief Complaint  Patient presents with  . Sore Throat  . Facial Pain    Lauren Beck is a 59 y.o. female with PMH significant for COPD and 30-pack-year smoking history presents to the ED with complaints of sinus infection and upper respiratory symptoms.  Patient reports that over the course of the past few days she has developed runny nose, mild headache, sinus pressure, and a nonproductive cough.  Given her history, she is concerned about bronchitis versus pneumonia.  She does not believe that COVID-19 is real.  She denies any fevers or chills, chest pain or difficulty breathing, abdominal pain, nausea or vomiting, diminished appetite, urinary symptoms, or changes in bowel habits.  She has a prescription for albuterol, but does not have any primary care provider.  She does not receive regular medical care.  She has been taking antihistamines and Flonase, with little improvement.  She did start the Flonase at time of symptom onset.  She states that she has had a problem with sinus infections for years and she has never been evaluated by ENT.  Patient is also endorsing diminished smell.  HPI     Past Medical History:  Diagnosis Date  . Allergic rhinitis   . COPD (chronic obstructive pulmonary disease) ([facility])   . Excessive or frequent menstruation   . History of migraines   . Hypothyroidism   . Nonspecific abnormal electrocardiogram (ECG) (EKG)   . Pure hypercholesterolemia   . Tobacco use disorder   . Unspecified adjustment reaction     Patient Active Problem List   Diagnosis Date Noted  . Stress reaction, emotional 12/26/2011  . Neck pain 06/10/2011  . COUGH 06/11/2010  . SINUSITIS - ACUTE-NOS 05/24/2010  . EXCESSIVE OR FREQUENT MENSTRUATION 03/05/2009  . PURE HYPERCHOLESTEROLEMIA 02/29/2008  . STRESS ELECTROCARDIOGRAM, ABNORMAL 03/23/2007  .  HYPOTHYROIDISM 03/16/2007  . TOBACCO ABUSE 03/16/2007  . REACTION, ADJUSTMENT NOS 03/16/2007  . ALLERGIC RHINITIS 03/16/2007  . COPD 03/16/2007  . IRREGULAR MENSES 03/16/2007  . URTICARIA 03/16/2007  . MIGRAINES, HX OF 03/16/2007    Past Surgical History:  Procedure Laterality Date  . HAMMER TOE SURGERY    . PELVIC LAPAROSCOPY    . TUBAL LIGATION       OB History   No obstetric history on file.     Family History  Problem Relation Age of Onset  . Depression Mother        ?  Marland Kitchen Other Father        No contact--unknown    Social History   Tobacco Use  . Smoking status: Current Every Day Smoker  . Smokeless tobacco: Never Used  Substance Use Topics  . Alcohol use: Yes  . Drug use: No    Home Medications Prior to Admission medications   Medication Sig Start Date End Date Taking? Authorizing Provider  acetaminophen (TYLENOL) 500 MG tablet Take 1,500 mg by mouth every 6 (six) hours as needed for mild pain or headache.     [provider]  albuterol (PROVENTIL HFA;VENTOLIN HFA) 108 (90 BASE) MCG/ACT inhaler Inhale 2 puffs into the lungs every 6 (six) hours as needed for wheezing.    [provider]  anti-nausea (EMETROL) solution Take 10 mLs by mouth every 15 (fifteen) minutes as needed for nausea or vomiting.    [provider]  benzonatate (TESSALON) 100 MG capsule Take  1 capsule (100 mg total) by mouth every 8 (eight) hours as needed for cough. 09/25/19   Lorelee New, PA-C  bismuth subsalicylate (PEPTO BISMOL) 262 MG/15ML suspension Take 15 mLs by mouth every 6 (six) hours as needed for indigestion or diarrhea or loose stools.    [provider]  esomeprazole (NEXIUM) 40 MG capsule Take 1 capsule (40 mg total) by mouth daily. 05/05/14   Mancel Bale, MD  levothyroxine (SYNTHROID, LEVOTHROID) 100 MCG tablet Take 100 mcg by mouth daily. 02/04/12 02/03/13  Tower, Audrie Gallus, MD  Multiple Vitamin (MULTIVITAMIN WITH MINERALS) TABS tablet Take  1 tablet by mouth daily.    [provider]  predniSONE (DELTASONE) 50 MG tablet Take 1 tablet (50 mg total) by mouth daily with breakfast for 5 days. 09/25/19 09/30/19  Lorelee New, PA-C    Allergies    Benadryl [diphenhydramine hcl]  Review of Systems   Review of Systems  All other systems reviewed and are negative.   Physical Exam Updated Vital Signs BP (!) 123/96 (BP Location: Left Arm)   Pulse 89   Temp 98.7 F (37.1 C) (Oral)   Resp 18   Ht 5\' 1"  (1.549 m)   Wt 52.2 kg   LMP 04/06/2012   SpO2 94%   BMI 21.73 kg/m   Physical Exam Vitals and nursing note reviewed. Exam conducted with a chaperone present.  Constitutional:      General: She is not in acute distress.    Appearance: Normal appearance. She is not ill-appearing.  HENT:     Head: Normocephalic and atraumatic.     Right Ear: Tympanic membrane normal.     Left Ear: Tympanic membrane normal.     Nose: Nose normal.     Mouth/Throat:     Comments: Patent oropharynx.  Nonerythematous.  No tonsillar hypertrophy or exudates noted.  No trismus.  Tolerating secretions well.   Eyes:     General: No scleral icterus.    Conjunctiva/sclera: Conjunctivae normal.  Cardiovascular:     Rate and Rhythm: Normal rate and regular rhythm.     Pulses: Normal pulses.     Heart sounds: Normal heart sounds.  Pulmonary:     Effort: Pulmonary effort is normal. No respiratory distress.     Breath sounds: Normal breath sounds. No wheezing or rales.  Abdominal:     General: Abdomen is flat. There is no distension.     Palpations: Abdomen is soft.     Tenderness: There is no abdominal tenderness. There is no guarding.  Musculoskeletal:     Cervical back: Normal range of motion. No rigidity.  Skin:    General: Skin is dry.     Capillary Refill: Capillary refill takes less than 2 seconds.  Neurological:     Mental Status: She is alert and oriented to person, place, and time.     GCS: GCS eye subscore is 4. GCS verbal  subscore is 5. GCS motor subscore is 6.  Psychiatric:        Mood and Affect: Mood normal.        Behavior: Behavior normal.        Thought Content: Thought content normal.      ED Results / Procedures / Treatments   Labs (all labs ordered are listed, but only abnormal results are displayed) Labs Reviewed  SARS CORONAVIRUS 2 (TAT 6-24 HRS)    EKG None  Radiology DG Chest Portable 1 View  Result Date: 09/25/2019 CLINICAL DATA:  Cough EXAM: PORTABLE CHEST 1 VIEW COMPARISON:  Two thousand eight FINDINGS: The heart size and mediastinal contours are within normal limits. Both lungs are clear. No pleural effusion. The visualized skeletal structures are unremarkable. IMPRESSION: No acute process in the chest. Electronically Signed   By: Guadlupe Spanish M.D.   On: 09/25/2019 21:29    Procedures Procedures (including critical care time)  Medications Ordered in ED Medications - No data to display  ED Course  I have reviewed the triage vital signs and the nursing notes.  Pertinent labs & imaging results that were available during my care of the patient were reviewed by me and considered in my medical decision making (see chart for details).    MDM Rules/Calculators/A&P                      Patient reports that she has a history of sinus infections and this feels similar.  However, she is also experiencing significant cough and it was impressive during my physical examination.  Will obtain chest x-ray to evaluate for pneumonia versus bronchitis given her history of COPD and 30-pack-year smoking history.  Given her chest congestion and COPD, feel as though a 5-day prednisone burst would be reasonable.  We will also obtain send out PCR COVID-19 testing.  Advising her to continue with the Flonase and antihistamines, as she has been doing.  I also encouraged her to attempt using nasal rinses.  I will place an attachment in her discharge instructions with how to perform nasal rinses.  Her  oropharyngeal exam was entirely benign and I have low suspicion for PTA, RPA, or other deep tissue infections.  She is neurologically intact and hemodynamically stable.  She is in no acute distress on my examination and simply feels "crummy".    Recommend that she continue taking her over-the-counter antitussive, I offered Tessalon Perles but she declined.  Recommend that she take Tylenol or ibuprofen should she develop any fevers or chills.  I would like for her to maintain isolation precautions pending results of her COVID-19 testing.  She is eating and drinking well and denies any chest pain, abdominal pain, nausea or vomiting, difficulty eating or drinking, or other symptoms that may otherwise necessitate laboratory work-up.  Given brevity of her illness, do not feel as though they would yield any significant findings.  We will place a referral for her to follow-up with [facility] so that she may get established with a primary care provider for ongoing evaluation management of her health wellbeing.  Will prescribe Tessalon Perles for her cough symptoms that are uncontrolled despite over-the-counter antitussive.  Strict ED return precautions discussed.  All of the evaluation and work-up results were discussed with the patient and any family at bedside. They were provided opportunity to ask any additional questions and have none at this time. They have expressed understanding of verbal discharge instructions as well as return precautions and are agreeable to the plan.   Lauren Beck was evaluated in Emergency Department on 09/25/2019 for the symptoms described in the history of present illness. She was evaluated in the context of the global COVID-19 pandemic, which necessitated consideration that the patient might be at risk for infection with the SARS-CoV-2 virus that causes COVID-19. Institutional protocols and algorithms that pertain to the evaluation of patients at risk for  COVID-19 are in a state of rapid change based on information released by regulatory bodies including the CDC and federal and  state organizations. These policies and algorithms were followed during the patient's care in the ED.   Final Clinical Impression(s) / ED Diagnoses Final diagnoses:  Viral upper respiratory tract infection    Rx / DC Orders ED Discharge Orders         Ordered    predniSONE (DELTASONE) 50 MG tablet  Daily with breakfast     09/25/19 2202    benzonatate (TESSALON) 100 MG capsule  Every 8 hours PRN     09/25/19 2202           Lorelee New, PA-C 09/25/19 2204    Benjiman Core, MD 09/25/19 [phone]

## 2019-09-25 NOTE — Discharge Instructions (Addendum)
Please take your Jerilynn Som, as prescribed.  I encourage you to continue with Flonase and antihistamines and I would like for you to add nasal rinses.  I have attached instructions on how to perform nasal rinses.  You may take throat lozenges or use Chloraseptic spray if your sore throat symptoms worsen.  Your chest x-ray did not demonstrate any evidence of pneumonia.  However, given your chest congestion and COPD, I feel as though it is reasonable to try a 5-day course of prednisone.  Please take in the morning as it can be activating.  Please return to the ED or seek immediate medical attention should experience any new or worsening symptoms.

## 2019-09-26 LAB — SARS CORONAVIRUS 2 (TAT 6-24 HRS): SARS Coronavirus 2: NEGATIVE

## 2019-09-26 NOTE — ED Notes (Signed)
Pt called about the Covid results ,I informed her someone would be calling her with results.

## 2020-11-23 ENCOUNTER — Ambulatory Visit: Payer: Self-pay

## 2020-11-23 ENCOUNTER — Other Ambulatory Visit: Payer: Self-pay

## 2020-11-23 ENCOUNTER — Ambulatory Visit (INDEPENDENT_AMBULATORY_CARE_PROVIDER_SITE_OTHER): Payer: 59 | Admitting: Surgical

## 2020-11-23 ENCOUNTER — Encounter: Payer: Self-pay | Admitting: Surgical

## 2020-11-23 DIAGNOSIS — M549 Dorsalgia, unspecified: Secondary | ICD-10-CM

## 2020-11-23 DIAGNOSIS — M542 Cervicalgia: Secondary | ICD-10-CM | POA: Diagnosis not present

## 2020-11-23 DIAGNOSIS — M25531 Pain in right wrist: Secondary | ICD-10-CM

## 2020-11-23 DIAGNOSIS — M546 Pain in thoracic spine: Secondary | ICD-10-CM

## 2020-11-23 MED ORDER — CYCLOBENZAPRINE HCL 10 MG PO TABS: 10.0000 mg | ORAL_TABLET | Freq: Three times a day (TID) | ORAL | 0 refills | Status: DC | PRN

## 2020-11-23 MED ORDER — PREDNISONE 5 MG (21) PO TBPK: ORAL_TABLET | ORAL | 0 refills | Status: DC

## 2020-11-23 NOTE — Progress Notes (Signed)
Office Visit Note   Patient: Lauren Beck           Date of Birth: [birth year]           MRN: [id number] Visit Date: 11/23/2020 Requested by: No referring provider defined for this encounter. PCP: Patient, No Pcp Per (Inactive)  Subjective: Chief Complaint  Patient presents with   Neck - Pain   UPPER BACK PAIN    HPI: Lauren Beck is a 60 y.o. female who presents to the office complaining of neck pain and right wrist pain.  Patient complains of multiple years of axial cervical spine pain with radiation into her left shoulder blade and down her left arm.  She notes numbness and tingling in all 5 fingers of her left hand, worst in the thumb that bothers her about 2 or 3 times per week.  Pain is getting worse.  She takes over-the-counter medications to help with her pain.  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  She does note occasional dizziness with walking but no severe change in her gait.  She has tried home exercises without relief..                ROS: All systems reviewed are negative as they relate to the chief complaint within the history of present illness.  Patient denies fevers or chills.  Assessment & Plan: Visit Diagnoses:  1. Neck pain   2. Upper back pain   3. Pain in right wrist     Plan: Patient is a 60 year old female who presents complaining of neck pain.  She has multiple year history of neck pain with radiation down her left arm into all 5 fingers and associated numbness and tingling.  She also has radiation into the left shoulder blade.  No significant gait abnormality and no weakness on exam today.  Radiographs are negative for any acute findings but do show some degeneration throughout the cervical spine.  With the longstanding nature of her symptoms and the severe tenderness on exam, plan to order MRI cervical spine for further evaluation.  Additionally,  patient complains of trauma to her right wrist while she was at work about 2 weeks ago.  She was operating a machine and reports that hit into her right wrist on the ulnar side.  She has had persistent ulnar-sided pain and swelling since the event with ecchymosis and instant swelling noted at the time of injury.  She had swelling that involved about a quarter of the distal ulnar forearm at the time but that is now decreased to the point where it only is around the ulnar wrist in general.  She has severe tenderness on exam and pain with ulnar deviation.  No motor dysfunction.  With persistent swelling and pain, plan to order MRI of the right wrist for evaluation of TFCC tear.  Follow-up after MRIs to review results.  Follow-Up Instructions: No follow-ups on file.   Orders:  Orders Placed This Encounter  Procedures   XR Cervical Spine 2 or 3 views   XR Thoracic Spine 2 View   XR Wrist Complete Right   No orders of the defined types were placed in this encounter.     Procedures: No procedures performed   Clinical Data: No additional findings.  Objective: Vital Signs: LMP 04/06/2012   Physical Exam:  Constitutional: Patient appears well-developed HEENT:  Head: Normocephalic Eyes:EOM are  normal Neck: Normal range of motion Cardiovascular: Normal rate Pulmonary/chest: Effort normal Neurologic: Patient is alert Skin: Skin is warm Psychiatric: Patient has normal mood and affect  Ortho Exam: Ortho exam demonstrates 5/5 motor strength of bilateral grip strength, finger abduction, pronation/supination, bicep, tricep, deltoid.  Decreased cervical spine extension, flexion, rotation.  Moderate tenderness throughout the axial cervical spine.  Spurling sign causes axial cervical spine pain but no radicular pain down the arm.  Paresthesias noted throughout the left upper extremity but no such symptoms on the right.  Regarding the right wrist, she has swelling and tenderness primarily through  the ulnar aspect of the wrist and the ulnar fovea.  She is able to extend her right wrist actively.  Intact finger AB duction and adduction.  No tenderness throughout the radial side of the wrist and no tenderness over the anatomic snuffbox.  She has no pain with passive extension but moderate to severe pain with passive flexion and ulnar deviation.  No pain with radial deviation.  She has severe tenderness in the ulnar fovea.  No ecchymosis noted.  Specialty Comments:  No specialty comments available.  Imaging: No results found.   PMFS History: Patient Active Problem List   Diagnosis Date Noted   Stress reaction, emotional 12/26/2011   Neck pain 06/10/2011   COUGH 06/11/2010   SINUSITIS - ACUTE-NOS 05/24/2010   EXCESSIVE OR FREQUENT MENSTRUATION 03/05/2009   PURE HYPERCHOLESTEROLEMIA 02/29/2008   STRESS ELECTROCARDIOGRAM, ABNORMAL 03/23/2007   HYPOTHYROIDISM 03/16/2007   TOBACCO ABUSE 03/16/2007   REACTION, ADJUSTMENT NOS 03/16/2007   ALLERGIC RHINITIS 03/16/2007   COPD 03/16/2007   IRREGULAR MENSES 03/16/2007   URTICARIA 03/16/2007   MIGRAINES, HX OF 03/16/2007   Past Medical History:  Diagnosis Date   Allergic rhinitis    COPD (chronic obstructive pulmonary disease) ([facility])    Excessive or frequent menstruation    History of migraines    Hypothyroidism    Nonspecific abnormal electrocardiogram (ECG) (EKG)    Pure hypercholesterolemia    Tobacco use disorder    Unspecified adjustment reaction     Family History  Problem Relation Age of Onset   Depression Mother        ?   Other Father        No contact--unknown    Past Surgical History:  Procedure Laterality Date   HAMMER TOE SURGERY     PELVIC LAPAROSCOPY     TUBAL LIGATION     Social History   Occupational History   Occupation: Teacher, adult education: [employer]  Tobacco Use   Smoking status: Current Every Day Smoker   Smokeless tobacco: Never Used  Substance and Sexual Activity    Alcohol use: Yes   Drug use: No   Sexual activity: Not Currently    Birth control/protection: Abstinence

## 2020-12-05 ENCOUNTER — Telehealth: Payer: Self-pay

## 2020-12-05 ENCOUNTER — Other Ambulatory Visit: Payer: 59

## 2020-12-05 ENCOUNTER — Other Ambulatory Visit: Payer: Self-pay | Admitting: Surgical

## 2020-12-05 ENCOUNTER — Other Ambulatory Visit (HOSPITAL_COMMUNITY): Payer: Self-pay

## 2020-12-05 MED ORDER — TRAMADOL HCL 50 MG PO TABS
50.0000 mg | ORAL_TABLET | Freq: Two times a day (BID) | ORAL | 0 refills | Status: DC | PRN
  Filled 2020-12-05: qty 30, 15d supply, fill #0

## 2020-12-05 MED ORDER — TRAMADOL HCL 50 MG PO TABS: 50.0000 mg | ORAL_TABLET | Freq: Two times a day (BID) | ORAL | 0 refills | Status: DC | PRN

## 2020-12-05 NOTE — Telephone Encounter (Signed)
Please advise 

## 2020-12-05 NOTE — Telephone Encounter (Signed)
FYI   I called and advised. Patient asked that medication be sent to [facility]. I advised that it was sent to [facility]. Patient requested this be changed. I phoned in rx to [facility]. I called [facility] and left voicemail asking they delete rx.

## 2020-12-05 NOTE — Telephone Encounter (Signed)
Sent in tramadol

## 2020-12-05 NOTE — Telephone Encounter (Signed)
Pts husband called regarding his wife asking for some pain medication stating that the muscle relaxer's isn't strong enough

## 2020-12-06 NOTE — Telephone Encounter (Signed)
Thanks betsy

## 2020-12-10 ENCOUNTER — Other Ambulatory Visit: Payer: Self-pay

## 2020-12-10 ENCOUNTER — Telehealth: Payer: Self-pay | Admitting: Orthopedic Surgery

## 2020-12-10 DIAGNOSIS — M542 Cervicalgia: Secondary | ICD-10-CM

## 2020-12-10 NOTE — Telephone Encounter (Signed)
Called discussed. Had questions about MRI wrist

## 2020-12-10 NOTE — Telephone Encounter (Signed)
Pt husband called about his wifes bone spurs. Please advise last message.   CB [phone]

## 2020-12-10 NOTE — Telephone Encounter (Signed)
Pts husband Jorja Loa called stating the pt was supposed to have a MRI but her insurance denied it so instead she was supposed to be sent to PT. However Jorja Loa is stating no one has called them. They would like a CB to discuss what the plan is for the pt.   [phone]

## 2020-12-10 NOTE — Telephone Encounter (Signed)
IC advised order put in for PT.

## 2020-12-12 ENCOUNTER — Encounter: Payer: Self-pay | Admitting: Physical Therapy

## 2020-12-12 ENCOUNTER — Ambulatory Visit: Payer: 59 | Admitting: Physical Therapy

## 2020-12-12 ENCOUNTER — Other Ambulatory Visit: Payer: Self-pay

## 2020-12-12 DIAGNOSIS — M6281 Muscle weakness (generalized): Secondary | ICD-10-CM | POA: Diagnosis not present

## 2020-12-12 DIAGNOSIS — M436 Torticollis: Secondary | ICD-10-CM | POA: Diagnosis not present

## 2020-12-12 DIAGNOSIS — G8929 Other chronic pain: Secondary | ICD-10-CM

## 2020-12-12 DIAGNOSIS — M542 Cervicalgia: Secondary | ICD-10-CM | POA: Diagnosis not present

## 2020-12-12 DIAGNOSIS — M25512 Pain in left shoulder: Secondary | ICD-10-CM

## 2020-12-12 DIAGNOSIS — M25531 Pain in right wrist: Secondary | ICD-10-CM

## 2020-12-12 NOTE — Therapy (Signed)
[facility] Physical Therapy [address] [place], [place], [address] Phone: [phone]   Fax:  [phone]  Physical Therapy Evaluation  Patient Details  Name: Lauren Beck MRN: [id number] Date of Birth: [birth year] Referring Provider (PT): Rise Paganini MD   Encounter Date: 12/12/2020   PT End of Session - 12/12/20 0854     Visit Number 1    Number of Visits 8    Date for PT Re-Evaluation 02/08/21    PT Start Time 0805    PT Stop Time 0850    PT Time Calculation (min) 45 min    Activity Tolerance Patient tolerated treatment well;Patient limited by pain    Behavior During Therapy [facility] for tasks assessed/performed             Past Medical History:  Diagnosis Date   Allergic rhinitis    COPD (chronic obstructive pulmonary disease) (HCC)    Excessive or frequent menstruation    History of migraines    Hypothyroidism    Nonspecific abnormal electrocardiogram (ECG) (EKG)    Pure hypercholesterolemia    Tobacco use disorder    Unspecified adjustment reaction     Past Surgical History:  Procedure Laterality Date   HAMMER TOE SURGERY     PELVIC LAPAROSCOPY     TUBAL LIGATION      There were no vitals filed for this visit.    Subjective Assessment - 12/12/20 0808     Subjective Pt arriving to therapy reporting neck pain that has been ongoing for years but has worsened over the last 4-6 weeks. Pt reporting she has bone spurs in her cervical spine. Pt reporting numbness and tingling down left arm and into finger tips.    Pertinent History COPD, migraines, hypothyrodism, abnormal EKG, pelvic laparoscopy, hammer toe surgery, tubal ligation , cervical bone spurs per pt report    Diagnostic tests X-ray: mild degerative cahnges noted with loss of lordosis cervical spine    Patient Stated Goals Stop hurting, work without pain    Currently in Pain? 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    Leisure going to [employer] and farmers market, going to [employer]   Overall Cognitive Status Within Functional Limits for tasks assessed      Posture/Postural Control   Posture/Postural Control Postural limitations    Postural Limitations Rounded Shoulders;Forward head    Posture Comments mild dowdgers hump in cervical spine      ROM / Strength   AROM / PROM / Strength AROM;Strength      AROM   Overall AROM  Deficits    Overall AROM Comments measurements in seated for cervical and standing for shoulder    AROM Assessment Site Cervical;Shoulder;Wrist    Right/Left Shoulder Right;Left    Right Shoulder Extension 60 Degrees    Right Shoulder Flexion 160 Degrees    Right Shoulder ABduction  164 Degrees    Right Shoulder Internal Rotation 65 Degrees    Right Shoulder External Rotation 60 Degrees    Left Shoulder Extension 40 Degrees    Left Shoulder Flexion 110 Degrees   with pain   Left Shoulder ABduction 132 Degrees    Left Shoulder Internal Rotation 65 Degrees    Left Shoulder External Rotation 68 Degrees    Right/Left Wrist Right    Right Wrist Extension 10 Degrees    Right Wrist Flexion 30 Degrees    Right Wrist Radial Deviation 12 Degrees    Right Wrist Ulnar Deviation 20 Degrees    Cervical Flexion 12    Cervical Extension 8    Cervical - Right Side Bend 15    Cervical - Left Side Bend 18    Cervical - Right Rotation 50    Cervical - Left Rotation 60      Strength   Overall Strength Comments Rt grip: 26.2ppsi, Left grip: 46.5 ppsi    Strength Assessment Site Shoulder    Right/Left Shoulder Right;Left    Right Shoulder Flexion 5/5    Right Shoulder Extension 5/5    Right Shoulder ABduction 5/5    Right Shoulder Internal Rotation 5/5    Right Shoulder External Rotation 5/5    Left Shoulder Flexion 4/5    Left Shoulder Extension 4/5    Left Shoulder ABduction 4/5    Left Shoulder Internal Rotation 4/5    Left Shoulder External Rotation 4/5      Palpation   Palpation comment TTP: left upper trap, levator, cervical paraspinals      Transfers   Five time sit to stand comments  12 seconds no UE support                        Objective measurements completed on examination: See above findings.               PT Education - 12/12/20 1610     Education Details PT POC, HEP    Person(s) Educated Patient    Methods Explanation;Handout;Verbal cues;Tactile cues;Demonstration    Comprehension Verbalized understanding;Returned demonstration;Need further instruction              PT Short Term Goals - 12/12/20 0855       PT SHORT TERM GOAL #1   Title Pt will be independent  in her HEP    Time 3    Period Weeks    Status New                PT Long Term Goals - 12/12/20 0902       PT LONG TERM GOAL #1   Title Pt will be independent in her advanced HEP    Time 8    Period Weeks    Status New    Target Date 02/08/21      PT LONG TERM GOAL #2   Title Pt will be able to improve her bilateral cervical rotation to >/= 65 degrees    Time 8    Period Weeks    Status New    Target Date 02/08/21      PT LONG TERM GOAL #3   Title Pt will improve her right grip strength to >/= 35 ppsi.    Time 8    Period Weeks    Status New    Target Date 02/08/21      PT LONG TERM GOAL #4   Title Pt will improve her left shoulder flexion and abduciton to >/= 150 degrees to improve function.    Time 8    Period Weeks    Status New    Target Date 02/08/21      PT LONG TERM GOAL #5   Title Pt will improve her left UE strength to >/= 4+/5 to improve functional mobility.    Time 8    Period Weeks    Status New    Target Date 02/08/21      Additional Long Term Goals   Additional Long Term Goals Yes      PT LONG TERM GOAL #6   Title pt will improve FOTO from 63%  to 70% function.    Time 8    Period Weeks    Status New    Target Date 02/08/21                    Plan - 12/12/20 1218     Clinical Impression Statement Pt arriving to therapy for evaluation of cervical pain which radiates down left arm and into finger tips. 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 Pt presenting today with weakness in left shoulder and decreased ROM. Pt also with limitations in cervical AROM. Pt reproting bone spurs in cervical spine and on right wrist which limits function and creates increased swelling in her wrist. X-ray reveals mild degenerative changes in cervical spine. Pt was edu in PT POC and issued a HEP where she was able to return demonstration with verbal cues.  Skilled PT needed to address pt's impairments with the below interventions.    Personal Factors and Comorbidities  Comorbidity 3+    Comorbidities COPD, migraines, hypothyrodism, abnormal EKG, pelvic laparoscopy, hammer toe surgery, tubal ligation, bone spurr in right wrist and cervial spine per pt report    Examination-Activity Limitations Lift;Reach Overhead;Sleep;Other    Examination-Participation Restrictions Occupation;Other;Community Activity;Cleaning    Stability/Clinical Decision Making Evolving/Moderate complexity    Clinical Decision Making Moderate    Rehab Potential Fair    PT Frequency 1x / week   limited by pt's $50 co-pay due to financial restraints   PT Duration 8 weeks    PT Treatment/Interventions ADLs/Self Care Home Management;Cryotherapy;Electrical Stimulation;Iontophoresis 4mg /ml Dexamethasone;Moist Heat;Traction;Ultrasound;Balance training;Therapeutic exercise;Therapeutic activities;Functional mobility training;Stair training;Neuromuscular re-education;Patient/family education;Passive range of motion;Manual techniques;Dry needling;Taping  PT Next Visit Plan left shoulder ROM, cervical ROM, strengthening as tolerated    PT Home Exercise Plan Access Code: [email or web address]  URL: [email or web address]  Date: 12/12/2020  Prepared by: Narda Amber     Exercises  Seated Gentle Upper Trapezius Stretch - 1 x daily - 7 x weekly - 5 reps - 10 seconds hold  3 Finger Cervical Rotation - 1 x daily - 7 x weekly - 5 reps - 10 seconds hold  Mid-Lower Cervical Extension SNAG with Strap - 3 x daily - 7 x weekly - 5 reps - 10 seconds hold  Supine Cervical Retraction with Towel - 3 x daily - 7 x weekly - 10 reps - 5 seconds hold     Wrist flexion and extension passively    Consulted and Agree with Plan of Care Patient             Patient will benefit from skilled therapeutic intervention in order to improve the following deficits and impairments:  Pain, Decreased strength, Increased edema, Postural dysfunction, Impaired flexibility, Impaired UE functional use, Decreased range of motion  Visit  Diagnosis: Cervicalgia  Neck stiffness  Muscle weakness (generalized)  Pain in right wrist  Chronic left shoulder pain     Problem List Patient Active Problem List   Diagnosis Date Noted   Stress reaction, emotional 12/26/2011   Neck pain 06/10/2011   COUGH 06/11/2010   SINUSITIS - ACUTE-NOS 05/24/2010   EXCESSIVE OR FREQUENT MENSTRUATION 03/05/2009   PURE HYPERCHOLESTEROLEMIA 02/29/2008   STRESS ELECTROCARDIOGRAM, ABNORMAL 03/23/2007   HYPOTHYROIDISM 03/16/2007   TOBACCO ABUSE 03/16/2007   REACTION, ADJUSTMENT NOS 03/16/2007   ALLERGIC RHINITIS 03/16/2007   COPD 03/16/2007   IRREGULAR MENSES 03/16/2007   URTICARIA 03/16/2007   MIGRAINES, HX OF 03/16/2007    Sharmon Leyden, PT, MPT 12/12/2020, 12:26 PM  [facility] Physical Therapy [address] [place], [place], [address] Phone: [phone]   Fax:  [phone]  Name: Lauren Beck MRN: [id number] Date of Birth: [birth year]

## 2020-12-12 NOTE — Patient Instructions (Addendum)
Access Code: [email or web address] URL: [email or web address] Date: 12/12/2020 Prepared by: Narda Amber  Exercises Seated Gentle Upper Trapezius Stretch - 1 x daily - 7 x weekly - 5 reps - 10 seconds hold 3 Finger Cervical Rotation - 1 x daily - 7 x weekly - 5 reps - 10 seconds hold Mid-Lower Cervical Extension SNAG with Strap - 3 x daily - 7 x weekly - 5 reps - 10 seconds hold Supine Cervical Retraction with Towel - 3 x daily - 7 x weekly - 10 reps - 5 seconds hold  Wrist flexion and extension passively

## 2020-12-13 NOTE — Progress Notes (Signed)
This encounter was created in error - please disregard.

## 2020-12-17 ENCOUNTER — Ambulatory Visit: Payer: 59 | Admitting: Orthopedic Surgery

## 2020-12-19 ENCOUNTER — Ambulatory Visit: Payer: 59 | Admitting: Orthopedic Surgery

## 2020-12-21 ENCOUNTER — Other Ambulatory Visit: Payer: Self-pay

## 2020-12-21 ENCOUNTER — Encounter: Payer: Self-pay | Admitting: Physical Therapy

## 2020-12-21 ENCOUNTER — Ambulatory Visit (INDEPENDENT_AMBULATORY_CARE_PROVIDER_SITE_OTHER): Payer: 59 | Admitting: Physical Therapy

## 2020-12-21 DIAGNOSIS — M542 Cervicalgia: Secondary | ICD-10-CM

## 2020-12-21 DIAGNOSIS — M436 Torticollis: Secondary | ICD-10-CM

## 2020-12-21 DIAGNOSIS — M6281 Muscle weakness (generalized): Secondary | ICD-10-CM

## 2020-12-21 NOTE — Therapy (Signed)
[facility] Physical Therapy [address] [place], [place], [address] Phone: [phone]   Fax:  [phone]  Physical Therapy Treatment  Patient Details  Name: Lauren Beck MRN: [id number] Date of Birth: [birth year] Referring Provider (PT): Rise Paganini MD   Encounter Date: 12/21/2020   PT End of Session - 12/21/20 1103     Visit Number 2    Number of Visits 8    Date for PT Re-Evaluation 02/08/21    PT Start Time 1103    PT Stop Time 1148    PT Time Calculation (min) 45 min    Activity Tolerance Patient tolerated treatment well;Patient limited by pain    Behavior During Therapy [facility] for tasks assessed/performed             Past Medical History:  Diagnosis Date   Allergic rhinitis    COPD (chronic obstructive pulmonary disease) (HCC)    Excessive or frequent menstruation    History of migraines    Hypothyroidism    Nonspecific abnormal electrocardiogram (ECG) (EKG)    Pure hypercholesterolemia    Tobacco use disorder    Unspecified adjustment reaction     Past Surgical History:  Procedure Laterality Date   HAMMER TOE SURGERY     PELVIC LAPAROSCOPY     TUBAL LIGATION      There were no vitals filed for this visit.   Subjective Assessment - 12/21/20 1103     Subjective She reports she still gets headaches, still working nights, and still wearing brace on Rt wrist.  she has done some of exercises, but usually feels tired when she gets home after 12 hrs. She is hoping to also start therapy for her Rt wrist.    Diagnostic tests X-ray: mild degerative cahnges noted with loss of lordosis cervical spine    Patient Stated Goals Stop hurting, work without pain    Currently in Pain? Yes    Pain Score 3     Pain Location Head    Pain Orientation Left    Pain Descriptors / Indicators Aching    Aggravating Factors  everything sometimes.    Pain Relieving Factors medicine,                [facility] PT Assessment - 12/21/20 0001        Assessment   Medical Diagnosis M54.2 neck pain    Referring Provider (PT) Rise Paganini MD    Hand Dominance Right    Prior Therapy yes, years ago when I broke my pinky finger on right      AROM   Cervical Flexion 30   with pain   Cervical Extension 22   with pain.  after tape= 40   Cervical - Right Side Bend 30    Cervical - Left Side Bend 30               [facility] Adult PT Treatment/Exercise - 12/21/20 0001       Neck Exercises: Seated   Cervical Isometrics Extension;5 secs;5 reps   pushing back of head into wall   Neck Retraction 1 rep;5 secs    Cervical Rotation Right;Left;5 reps    Shoulder Rolls 5 reps;Backwards    Other Seated Exercise scap retraction x 5 sec x 8 reps (limited tolerance)- cues for neutral head posture.  neck flexion with R/Lt rotation (per HEP.)x 5 reps    Other Seated Exercise Lt wrist flex/ext stretches x 3 reps of 10-15 sec, then thumb tucked in hand and  ulnar deviation for thumb stretch x 3 reps of 10 sec      Manual Therapy   Manual Therapy Soft tissue mobilization;Taping    Soft tissue mobilization IASTM and gentle STM to Lt upper/mid trap and levator; limit tolerance to light pressure.    [employer] IT consultant I strips of sensitive skin Rock tape applied vertically over bilat c-spine, and perpendicular strip applied with 20% stretch over ~C6. applied to decompress tissue and increase proprioception.      Neck Exercises: Stretches   Upper Trapezius Stretch Right;Left;3 reps;10 seconds    Levator Stretch Right;Left;2 reps;10 seconds                      PT Short Term Goals - 12/12/20 0855       PT SHORT TERM GOAL #1   Title Pt will be independent in her HEP    Time 3    Period Weeks    Status New               PT Long Term Goals - 12/12/20 0902       PT LONG TERM GOAL #1   Title Pt will be independent in her advanced HEP    Time 8    Period Weeks    Status New    Target Date  02/08/21      PT LONG TERM GOAL #2   Title Pt will be able to improve her bilateral cervical rotation to >/= 65 degrees    Time 8    Period Weeks    Status New    Target Date 02/08/21      PT LONG TERM GOAL #3   Title Pt will improve her right grip strength to >/= 35 ppsi.    Time 8    Period Weeks    Status New    Target Date 02/08/21      PT LONG TERM GOAL #4   Title Pt will improve her left shoulder flexion and abduciton to >/= 150 degrees to improve function.    Time 8    Period Weeks    Status New    Target Date 02/08/21      PT LONG TERM GOAL #5   Title Pt will improve her left UE strength to >/= 4+/5 to improve functional mobility.    Time 8    Period Weeks    Status New    Target Date 02/08/21      Additional Long Term Goals   Additional Long Term Goals Yes      PT LONG TERM GOAL #6   Title pt will improve FOTO from 63%  to 70% function.    Time 8    Period Weeks    Status New    Target Date 02/08/21                   Plan - 12/21/20 1120     Clinical Impression Statement Pt unable to tolerate cervical ext SNAGS; told pt to hold exercise for now. Encouraged pt to continue neck stretches and ROM, as well as being more conscious of posture.  Limited tolerance for IASTM and light touch STM to cervical/ trapezius.  Good response to kinesiology tape. Pt demonstrated improved neck ext AROM to 40and less pain (1/10) after application of tape.  Goals are ongoing.    Personal Factors and Comorbidities Comorbidity 3+  Comorbidities COPD, migraines, hypothyrodism, abnormal EKG, pelvic laparoscopy, hammer toe surgery, tubal ligation, bone spurr in right wrist and cervial spine per pt report    Examination-Activity Limitations Lift;Reach Overhead;Sleep;Other    Examination-Participation Restrictions Occupation;Other;Community Activity;Cleaning    Stability/Clinical Decision Making Evolving/Moderate complexity    Rehab Potential Fair    PT Frequency 1x / week    limited by pt's $50 co-pay due to financial restraints   PT Duration 8 weeks    PT Treatment/Interventions ADLs/Self Care Home Management;Cryotherapy;Electrical Stimulation;Iontophoresis 4mg /ml Dexamethasone;Moist Heat;Traction;Ultrasound;Balance training;Therapeutic exercise;Therapeutic activities;Functional mobility training;Stair training;Neuromuscular re-education;Patient/family education;Passive range of motion;Manual techniques;Dry needling;Taping    PT Next Visit Plan left shoulder ROM, cervical ROM, strengthening as tolerated.  Assess response to tape  (skin reaction?).  Add wrist therapy if receive referral for Rt wrist.    PT Home Exercise Plan Access Code: [email or web address]  URL: [email or web address]  Date: 12/12/2020  Prepared by: 12/14/2020     Exercises  Seated Gentle Upper Trapezius Stretch - 1 x daily - 7 x weekly - 5 reps - 10 seconds hold  3 Finger Cervical Rotation - 1 x daily - 7 x weekly - 5 reps - 10 seconds hold  Mid-Lower Cervical Extension SNAG with Strap - 3 x daily - 7 x weekly - 5 reps - 10 seconds hold  Supine Cervical Retraction with Towel - 3 x daily - 7 x weekly - 10 reps - 5 seconds hold     Wrist flexion and extension passively    Consulted and Agree with Plan of Care Patient             Patient will benefit from skilled therapeutic intervention in order to improve the following deficits and impairments:  Pain, Decreased strength, Increased edema, Postural dysfunction, Impaired flexibility, Impaired UE functional use, Decreased range of motion  Visit Diagnosis: Cervicalgia  Neck stiffness  Muscle weakness (generalized)     Problem List Patient Active Problem List   Diagnosis Date Noted   Stress reaction, emotional 12/26/2011   Neck pain 06/10/2011   COUGH 06/11/2010   SINUSITIS - ACUTE-NOS 05/24/2010   EXCESSIVE OR FREQUENT MENSTRUATION 03/05/2009   PURE HYPERCHOLESTEROLEMIA 02/29/2008   STRESS ELECTROCARDIOGRAM, ABNORMAL 03/23/2007    HYPOTHYROIDISM 03/16/2007   TOBACCO ABUSE 03/16/2007   REACTION, ADJUSTMENT NOS 03/16/2007   ALLERGIC RHINITIS 03/16/2007   COPD 03/16/2007   IRREGULAR MENSES 03/16/2007   URTICARIA 03/16/2007   MIGRAINES, HX OF 03/16/2007   03/18/2007, PTA 12/21/20 12:18 PM  [facility] Therapy [address] [place], [place], [place] Phone: [phone]   Fax:  [phone]  Name: Lauren Beck MRN: Lauren Beck Date of Birth: February 10, 1961

## 2020-12-21 NOTE — Patient Instructions (Signed)

## 2020-12-26 ENCOUNTER — Other Ambulatory Visit: Payer: Self-pay

## 2020-12-26 ENCOUNTER — Encounter: Payer: Self-pay | Admitting: Rehabilitative and Restorative Service Providers"

## 2020-12-26 ENCOUNTER — Ambulatory Visit: Payer: PRIVATE HEALTH INSURANCE | Admitting: Rehabilitative and Restorative Service Providers"

## 2020-12-26 DIAGNOSIS — G8929 Other chronic pain: Secondary | ICD-10-CM

## 2020-12-26 DIAGNOSIS — M6281 Muscle weakness (generalized): Secondary | ICD-10-CM | POA: Diagnosis not present

## 2020-12-26 DIAGNOSIS — M25512 Pain in left shoulder: Secondary | ICD-10-CM

## 2020-12-26 DIAGNOSIS — M542 Cervicalgia: Secondary | ICD-10-CM

## 2020-12-26 DIAGNOSIS — M436 Torticollis: Secondary | ICD-10-CM | POA: Diagnosis not present

## 2020-12-26 NOTE — Therapy (Signed)
[facility] Physical Therapy [address] [place], [place], [address] Phone: [phone]   Fax:  [phone]  Physical Therapy Treatment  Patient Details  Name: Lauren Beck MRN: [id number] Date of Birth: 01/13/1961 Referring Provider (PT): Rise Paganini MD   Encounter Date: 12/26/2020   PT End of Session - 12/26/20 0942     Visit Number 3    Number of Visits 8    Date for PT Re-Evaluation 02/08/21    PT Start Time 0935    PT Stop Time 1021    PT Time Calculation (min) 46 min    Activity Tolerance Patient tolerated treatment well    Behavior During Therapy [facility] for tasks assessed/performed             Past Medical History:  Diagnosis Date   Allergic rhinitis    COPD (chronic obstructive pulmonary disease) (HCC)    Excessive or frequent menstruation    History of migraines    Hypothyroidism    Nonspecific abnormal electrocardiogram (ECG) (EKG)    Pure hypercholesterolemia    Tobacco use disorder    Unspecified adjustment reaction     Past Surgical History:  Procedure Laterality Date   HAMMER TOE SURGERY     PELVIC LAPAROSCOPY     TUBAL LIGATION      There were no vitals filed for this visit.   Subjective Assessment - 12/26/20 0934     Subjective I was fine when she put the tape on me but when I got to work it was throbbing; the tape didn't work. Pt is irate that the MD didn't OK the wrist being treated    Currently in Pain? Yes    Pain Score 5     Pain Location Neck    Pain Orientation Left    Pain Descriptors / Indicators Tightness                               [facility] Adult PT Treatment/Exercise - 12/26/20 0001       Neck Exercises: Standing   Other Standing Exercises corner stretch 2x30 sec      Modalities   Modalities Ultrasound      Ultrasound   Ultrasound Location L Upper Trap, cervical paraspinals    Ultrasound Parameters 2.0 w/cm2, 100%, x 8 min    Ultrasound Goals Pain      Manual Therapy   Soft  tissue mobilization STW/TP L Upper/Mid Trap, STW to scalenes and SCM, suboccipital release without and during Levator, Upper Trap stretches                      PT Short Term Goals - 12/26/20 1122       PT SHORT TERM GOAL #1   Title Pt will be independent in her HEP    Status On-going               PT Long Term Goals - 12/12/20 0902       PT LONG TERM GOAL #1   Title Pt will be independent in her advanced HEP    Time 8    Period Weeks    Status New    Target Date 02/08/21      PT LONG TERM GOAL #2   Title Pt will be able to improve her bilateral cervical rotation to >/= 65 degrees    Time 8    Period Weeks  Status New    Target Date 02/08/21      PT LONG TERM GOAL #3   Title Pt will improve her right grip strength to >/= 35 ppsi.    Time 8    Period Weeks    Status New    Target Date 02/08/21      PT LONG TERM GOAL #4   Title Pt will improve her left shoulder flexion and abduciton to >/= 150 degrees to improve function.    Time 8    Period Weeks    Status New    Target Date 02/08/21      PT LONG TERM GOAL #5   Title Pt will improve her left UE strength to >/= 4+/5 to improve functional mobility.    Time 8    Period Weeks    Status New    Target Date 02/08/21      Additional Long Term Goals   Additional Long Term Goals Yes      PT LONG TERM GOAL #6   Title pt will improve FOTO from 63%  to 70% function.    Time 8    Period Weeks    Status New    Target Date 02/08/21                   Plan - 12/26/20 1120     Clinical Impression Statement Assess pt tolerance to manual therapy, [place], and performing hot pack/exercise regimen at home. Pt tolerated manual therapy/US well during treatment but did have several trigger points and points of tenderness throughout  manual of which she was able to tolerate with PT guidance. Pt would benefit from further PT to address neck pain and tightness, posture; see POC    PT Treatment/Interventions  ADLs/Self Care Home Management;Cryotherapy;Electrical Stimulation;Iontophoresis 4mg /ml Dexamethasone;Moist Heat;Traction;Ultrasound;Balance training;Therapeutic exercise;Therapeutic activities;Functional mobility training;Stair training;Neuromuscular re-education;Patient/family education;Passive range of motion;Manual techniques;Dry needling;Taping    PT Next Visit Plan left shoulder ROM, cervical ROM, strengthening as tolerated.  Assess response to US/MT and performing hot pack and stretching regimen at home (see pt education). Pt also to bring in picture of sleeping postures at home for further guidance on pillow usage. Add wrist therapy if receive referral for Rt wrist; wrist referral not received as of today's tx. Advised pt to contact MD to add.    Consulted and Agree with Plan of Care Patient             Patient will benefit from skilled therapeutic intervention in order to improve the following deficits and impairments:  Pain, Decreased strength, Increased edema, Postural dysfunction, Impaired flexibility, Impaired UE functional use, Decreased range of motion  Visit Diagnosis: Cervicalgia  Neck stiffness  Muscle weakness (generalized)  Chronic left shoulder pain     Problem List Patient Active Problem List   Diagnosis Date Noted   Stress reaction, emotional 12/26/2011   Neck pain 06/10/2011   COUGH 06/11/2010   SINUSITIS - ACUTE-NOS 05/24/2010   EXCESSIVE OR FREQUENT MENSTRUATION 03/05/2009   PURE HYPERCHOLESTEROLEMIA 02/29/2008   STRESS ELECTROCARDIOGRAM, ABNORMAL 03/23/2007   HYPOTHYROIDISM 03/16/2007   TOBACCO ABUSE 03/16/2007   REACTION, ADJUSTMENT NOS 03/16/2007   ALLERGIC RHINITIS 03/16/2007   COPD 03/16/2007   IRREGULAR MENSES 03/16/2007   URTICARIA 03/16/2007   MIGRAINES, HX OF 03/16/2007    03/18/2007, PT, DPT 12/26/2020, 11:26 AM  [facility] Physical Therapy [address] [place], [place], [place] Phone: [phone]   Fax:   [phone]  Name: Lauren Beck MRN:  [id number] Date of Birth: [birth year]

## 2020-12-26 NOTE — Patient Instructions (Signed)
Discussed with pt doing exercises and using heat x 10-15 min as tolerated and comfortable before work, after work, and before and after sleep. Also discussed pillow usage in bed and sleeping on 1-2 pillows/sleeping posture. Pt to have husband take a picture of her sleeping position in bed and bring to therapy for further guidance.

## 2021-01-01 ENCOUNTER — Telehealth: Payer: Self-pay | Admitting: Orthopedic Surgery

## 2021-01-01 NOTE — Telephone Encounter (Signed)
Pt is having issues with right wrist and her husband wants to speak to someone about it.   CB [phone]

## 2021-01-02 ENCOUNTER — Encounter: Payer: Self-pay | Admitting: Physical Therapy

## 2021-01-02 ENCOUNTER — Other Ambulatory Visit: Payer: Self-pay

## 2021-01-02 ENCOUNTER — Telehealth: Payer: Self-pay

## 2021-01-02 ENCOUNTER — Ambulatory Visit: Payer: 59 | Admitting: Physical Therapy

## 2021-01-02 DIAGNOSIS — M542 Cervicalgia: Secondary | ICD-10-CM

## 2021-01-02 DIAGNOSIS — M6281 Muscle weakness (generalized): Secondary | ICD-10-CM | POA: Diagnosis not present

## 2021-01-02 DIAGNOSIS — M25512 Pain in left shoulder: Secondary | ICD-10-CM

## 2021-01-02 DIAGNOSIS — M436 Torticollis: Secondary | ICD-10-CM | POA: Diagnosis not present

## 2021-01-02 DIAGNOSIS — M25531 Pain in right wrist: Secondary | ICD-10-CM

## 2021-01-02 DIAGNOSIS — G8929 Other chronic pain: Secondary | ICD-10-CM

## 2021-01-02 NOTE — Telephone Encounter (Signed)
Patient came into the office she wanted to let Lauren Beck know that she does not need a doctors note she will be working until she hears back from [employer] tomorrow call [phone]

## 2021-01-02 NOTE — Telephone Encounter (Signed)
Luke called and discussed 

## 2021-01-02 NOTE — Therapy (Addendum)
[facility] Physical Therapy [address] [place], [place], [address] Phone: [phone]   Fax:  [phone]  Physical Therapy Treatment/Discharge Summary  Patient Details  Name: Lauren Beck MRN: [id number] Date of Birth: 08-22-1960 Referring Provider (PT): Marcene Duos MD   Encounter Date: 01/02/2021   PT End of Session - 01/02/21 0900     Visit Number 4    Number of Visits 8    Date for PT Re-Evaluation 02/08/21    PT Start Time 0850    PT Stop Time 0928    PT Time Calculation (min) 38 min    Activity Tolerance Patient tolerated treatment well    Behavior During Therapy [facility] for tasks assessed/performed             Past Medical History:  Diagnosis Date   Allergic rhinitis    COPD (chronic obstructive pulmonary disease) ([facility])    Excessive or frequent menstruation    History of migraines    Hypothyroidism    Nonspecific abnormal electrocardiogram (ECG) (EKG)    Pure hypercholesterolemia    Tobacco use disorder    Unspecified adjustment reaction     Past Surgical History:  Procedure Laterality Date   HAMMER TOE SURGERY     PELVIC LAPAROSCOPY     TUBAL LIGATION      There were no vitals filed for this visit.   Subjective Assessment - 01/02/21 0857     Subjective It reporting relief after last treatment with STM and [place].    Pertinent History COPD, migraines, hypothyrodism, abnormal EKG, pelvic laparoscopy, hammer toe surgery, tubal ligation , cervical bone spurs per pt report    Diagnostic tests X-ray: mild degerative cahnges noted with loss of lordosis cervical spine    Patient Stated Goals Stop hurting, work without pain    Currently in Pain? Yes    Pain Score 5     Pain Location Neck    Pain Orientation Left    Pain Descriptors / Indicators Aching;Tightness;Sore    Pain Type Chronic pain    Pain Onset More than a month ago                [facility] PT Assessment - 01/02/21 0001       Assessment   Medical Diagnosis M54.2 neck  pain    Referring Provider (PT) Marcene Duos MD    Hand Dominance Right      AROM   Cervical Flexion 30    Cervical Extension 22    Cervical - Right Side Bend 30    Cervical - Left Side Bend 30                           [facility] Adult PT Treatment/Exercise - 01/02/21 0001       Neck Exercises: Seated   Neck Retraction 1 rep    Cervical Rotation Right;Left;5 reps      Modalities   Modalities Ultrasound      Ultrasound   Ultrasound Location left upper trap, cervical paraspinals    Ultrasound Parameters 1.5 w/cm2, 100%, x 12 minutes   using ultrasound gel and biofreeze   Ultrasound Goals Pain      Manual Therapy   Soft tissue mobilization STW/TP L Upper/Mid Trap, STW to scalenes and SCM, suboccipital release without and during Levator, Upper Trap stretches      Neck Exercises: Stretches   Upper Trapezius Stretch Left;Right;3 reps;20 seconds    Levator Stretch Right;Left;3 reps;20  seconds    Corner Stretch 3 reps;20 seconds                      PT Short Term Goals - 01/02/21 0944       PT SHORT TERM GOAL #1   Title Pt will be independent in her HEP    Status On-going               PT Long Term Goals - 01/02/21 0944       PT LONG TERM GOAL #1   Title Pt will be independent in her advanced HEP    Status On-going      PT LONG TERM GOAL #2   Title Pt will be able to improve her bilateral cervical rotation to >/= 65 degrees    Status On-going      PT LONG TERM GOAL #3   Title Pt will improve her right grip strength to >/= 35 ppsi.    Status On-going      PT LONG TERM GOAL #4   Title Pt will improve her left shoulder flexion and abduciton to >/= 150 degrees to improve function.    Status On-going      PT LONG TERM GOAL #5   Title Pt will improve her left UE strength to >/= 4+/5 to improve functional mobility.    Status On-going                   Plan - 01/02/21 0941     Clinical Impression Statement Pt arriving  reporting 5/10 pain in her left upper trap and neck. Pt reporting a decrease in her headache frequency. STM / trigger point release and US performed today with good response. Continue skilled PT consider DN to left upper trap.    Personal Factors and Comorbidities Comorbidity 3+    Comorbidities COPD, migraines, hypothyrodism, abnormal EKG, pelvic laparoscopy, hammer toe surgery, tubal ligation, bone spurr in right wrist and cervial spine per pt report    Examination-Activity Limitations Lift;Reach Overhead;Sleep;Other    Stability/Clinical Decision Making Evolving/Moderate complexity    Rehab Potential Fair    PT Frequency 1x / week    PT Duration 8 weeks    PT Treatment/Interventions ADLs/Self Care Home Management;Cryotherapy;Electrical Stimulation;Iontophoresis 20m/ml Dexamethasone;Moist Heat;Traction;Ultrasound;Balance training;Therapeutic exercise;Therapeutic activities;Functional mobility training;Stair training;Neuromuscular re-education;Patient/family education;Passive range of motion;Manual techniques;Dry needling;Taping    PT Next Visit Plan left shoulder ROM, cervical ROM, strengthening as tolerated.  Assess response to US/MT and performing hot pack and stretching regimen at home (see pt education). Pt also to bring in picture of sleeping postures at home for further guidance on pillow usage. Add wrist therapy if receive referral for Rt wrist; wrist referral not received as of today's tx. Advised pt to contact MD to add.    PT Home Exercise Plan Access Code: [email or web address]  URL: [email or web address]  Date: 12/12/2020  Prepared by: JKearney Hard    Exercises  Seated Gentle Upper Trapezius Stretch - 1 x daily - 7 x weekly - 5 reps - 10 seconds hold  3 Finger Cervical Rotation - 1 x daily - 7 x weekly - 5 reps - 10 seconds hold  Mid-Lower Cervical Extension SNAG with Strap - 3 x daily - 7 x weekly - 5 reps - 10 seconds hold  Supine Cervical Retraction with Towel - 3 x daily - 7 x weekly  - 10 reps - 5 seconds hold     Wrist flexion and extension passively  Consulted and Agree with Plan of Care Patient             Patient will benefit from skilled therapeutic intervention in order to improve the following deficits and impairments:  Pain, Decreased strength, Increased edema, Postural dysfunction, Impaired flexibility, Impaired UE functional use, Decreased range of motion  Visit Diagnosis: Cervicalgia  Neck stiffness  Muscle weakness (generalized)  Chronic left shoulder pain  Pain in right wrist     Problem List Patient Active Problem List   Diagnosis Date Noted   Stress reaction, emotional 12/26/2011   Neck pain 06/10/2011   COUGH 06/11/2010   SINUSITIS - ACUTE-NOS 05/24/2010   EXCESSIVE OR FREQUENT MENSTRUATION 03/05/2009   PURE HYPERCHOLESTEROLEMIA 02/29/2008   STRESS ELECTROCARDIOGRAM, ABNORMAL 03/23/2007   HYPOTHYROIDISM 03/16/2007   TOBACCO ABUSE 03/16/2007   REACTION, ADJUSTMENT NOS 03/16/2007   ALLERGIC RHINITIS 03/16/2007   COPD 03/16/2007   IRREGULAR MENSES 03/16/2007   URTICARIA 03/16/2007   MIGRAINES, HX OF 03/16/2007    Oretha Caprice, PT, MPT 01/02/2021, 9:46 AM  [facility] Physical Therapy [address] [place], [place], [address] Phone: [phone]   Fax:  [phone]  Name: Lauren Beck MRN: [id number] Date of Birth: [birth year]      PHYSICAL THERAPY DISCHARGE SUMMARY  Visits from Start of Care: 4  Current functional level related to goals / functional outcomes: See above   Remaining deficits: unknown   Education / Equipment: HEP   Patient agrees to discharge. Patient goals were not met. Patient is being discharged due to not returning since the last visit.  Laureen Abrahams, PT, DPT 02/06/21 2:48 PM  [facility] [address] [place], [place], [address] Phone: [phone]   Fax:  [phone]

## 2021-01-04 ENCOUNTER — Other Ambulatory Visit: Payer: Self-pay

## 2021-01-04 ENCOUNTER — Telehealth: Payer: Self-pay | Admitting: Surgical

## 2021-01-04 DIAGNOSIS — M25531 Pain in right wrist: Secondary | ICD-10-CM

## 2021-01-04 DIAGNOSIS — M542 Cervicalgia: Secondary | ICD-10-CM

## 2021-01-04 NOTE — Telephone Encounter (Signed)
Called patient and discussed how she is doing since last office visit 6 weeks ago today which was on 11/23/2020.  She states that her wrist pain that she has been dealing with is not any better and actually feels a little worse.  She has continued swelling and pain that interferes with her ability to lift and perform her duties at work.  She has been using anti-inflammatories, ice, wrist brace without any relief.  Additionally, patient has been doing physical therapy for her neck pain which has provided minimal improvement.  With lack of improvement with PT with her neck pain and with no improvement over the last 6 weeks of conservative management of the wrist pain, plan to proceed with MRI of the right wrist and MRI of the cervical spine as planned initially.  Spoke with patient's insurance representative on a peer to peer call about 2 weeks ago who assured [place] that if patient had no improvement at 6 weeks, MRI scans would be covered.

## 2021-01-04 NOTE — Telephone Encounter (Signed)
PT called stating she is still having pain in her wrist and she would like our office to reach out to insurance and see if they will approve her for an MRI. Pt would like a CB to be updated after our office reaches out to insurance.   [phone]

## 2021-01-07 ENCOUNTER — Telehealth: Payer: Self-pay

## 2021-01-07 NOTE — Telephone Encounter (Signed)
Pt called and would like to know if she can go ahead and get a note to be written out of work

## 2021-01-07 NOTE — Telephone Encounter (Signed)
Patient called regarding previous message

## 2021-01-08 ENCOUNTER — Telehealth: Payer: Self-pay

## 2021-01-08 NOTE — Telephone Encounter (Signed)
Patients husband called he stated he missed a call from [place] he stated pt appointment for tomorrow needs to be cancelled per luke since the therapy isn't helping her call back:[phone]

## 2021-01-09 ENCOUNTER — Ambulatory Visit
Admission: RE | Admit: 2021-01-09 | Discharge: 2021-01-09 | Disposition: A | Discharge status: Home / Self Care | Payer: 59 | Source: Ambulatory Visit | Attending: Orthopaedic Surgery | Admitting: Orthopaedic Surgery

## 2021-01-09 ENCOUNTER — Encounter: Payer: 59 | Admitting: Physical Therapy

## 2021-01-09 DIAGNOSIS — M542 Cervicalgia: Secondary | ICD-10-CM

## 2021-01-09 DIAGNOSIS — M25531 Pain in right wrist: Secondary | ICD-10-CM

## 2021-01-16 ENCOUNTER — Encounter: Payer: 59 | Admitting: Physical Therapy

## 2021-01-20 ENCOUNTER — Ambulatory Visit
Admission: RE | Admit: 2021-01-20 | Discharge: 2021-01-20 | Disposition: A | Discharge status: Home / Self Care | Payer: 59 | Source: Ambulatory Visit | Attending: Orthopaedic Surgery | Admitting: Orthopaedic Surgery

## 2021-01-20 ENCOUNTER — Encounter: Payer: Self-pay | Admitting: Orthopaedic Surgery

## 2021-01-20 DIAGNOSIS — M542 Cervicalgia: Secondary | ICD-10-CM

## 2021-01-20 DIAGNOSIS — M25531 Pain in right wrist: Secondary | ICD-10-CM

## 2021-01-23 ENCOUNTER — Other Ambulatory Visit: Payer: Self-pay

## 2021-01-23 ENCOUNTER — Ambulatory Visit (INDEPENDENT_AMBULATORY_CARE_PROVIDER_SITE_OTHER): Payer: 59 | Admitting: Orthopedic Surgery

## 2021-01-23 ENCOUNTER — Encounter: Payer: 59 | Admitting: Physical Therapy

## 2021-01-23 DIAGNOSIS — M542 Cervicalgia: Secondary | ICD-10-CM | POA: Diagnosis not present

## 2021-01-24 ENCOUNTER — Telehealth: Payer: Self-pay | Admitting: Physical Medicine and Rehabilitation

## 2021-01-24 ENCOUNTER — Encounter: Payer: Self-pay | Admitting: Orthopedic Surgery

## 2021-01-24 NOTE — Telephone Encounter (Signed)
Pt's husband Jorja Loa called requesting for Malva Cogan or Toni Amend to call his phone to set neck injection for his wife. Please call Tim at [phone].

## 2021-01-24 NOTE — Progress Notes (Signed)
Office Visit Note   Patient: Lauren Beck           Date of Birth: 1960-10-12           MRN: [id number] Visit Date: 01/23/2021 Requested by: No referring provider defined for this encounter. PCP: Patient, No Pcp Per (Inactive)  Subjective: Chief Complaint  Patient presents with   Other     Scan review    HPI: Lauren Beck is a 60 year old patient with neck and right wrist pain.  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  Wrist MRI scan is also reviewed with the patient.  She has fairly significant bony edema in the ulnar styloid along with ECU tendinitis at this level.  There is also some more radial sided arthritis around the intercarpal region but she is not particularly symptomatic on that side.  TFCC is intact.  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  She has been splinting the right wrist which helps some.              ROS: All systems reviewed are negative as they relate to the chief complaint within the history of present illness.  Patient denies  fevers or chills.   Assessment & Plan: Visit Diagnoses:  1. Neck pain     Plan: Impression is neck pain with left-sided foraminal stenosis which does not necessarily look operative at this time but could become operative if injections do not help.  I would like to send her to Dr. Alvester Morin for cervical spine ESI for primarily bilateral radiculopathy which is slightly worse clinically on the right than the left but more impressive radiologically on the left than the right.  Topical to be used along with a splint.  Work note provided to continue her crushing type work as opposed to strapping type work which is highly aggravating to her wrist.  Would like her to do that for about 6 months to avoid aggravating the repetition injury to that right  wrist.  Follow-Up Instructions: No follow-ups on file.   Orders:  Orders Placed This Encounter  Procedures   Ambulatory referral to Physical Medicine Rehab   No orders of the defined types were placed in this encounter.     Procedures: No procedures performed   Clinical Data: No additional findings.  Objective: Vital Signs: LMP 04/06/2012   Physical Exam:   Constitutional: Patient appears well-developed HEENT:  Head: Normocephalic Eyes:EOM are normal Neck: Normal range of motion Cardiovascular: Normal rate Pulmonary/chest: Effort normal Neurologic: Patient is alert Skin: Skin is warm Psychiatric: Patient has normal mood and affect   Ortho Exam: Ortho exam demonstrates pretty good cervical spine range of motion.  5 out of 5 grip EPL FPL interosseous wrist flexion wrist extension bicep triceps and deltoid strength.  Radial pulse intact bilaterally.  Does have a mild paresthesias on the left in the C5 distribution but deltoid strength is intact bilaterally.  No restriction of passive range of motion both shoulders.  Right wrist is examined.  Fairly tender over that ulnar styloid.  Has reasonable flexion extension symmetric to the left wrist but ulnar and radial deviation is more painful.  Radial pulse is intact.  No subluxation or tenderness of the ulnar nerve in the cubital tunnel.  Specialty Comments:  No  specialty comments available.  Imaging: No results found.   PMFS History: Patient Active Problem List   Diagnosis Date Noted   Stress reaction, emotional 12/26/2011   Neck pain 06/10/2011   COUGH 06/11/2010   SINUSITIS - ACUTE-NOS 05/24/2010   EXCESSIVE OR FREQUENT MENSTRUATION 03/05/2009   PURE HYPERCHOLESTEROLEMIA 02/29/2008   STRESS ELECTROCARDIOGRAM, ABNORMAL 03/23/2007   HYPOTHYROIDISM 03/16/2007   TOBACCO ABUSE 03/16/2007   REACTION, ADJUSTMENT NOS 03/16/2007   ALLERGIC RHINITIS 03/16/2007   COPD 03/16/2007   IRREGULAR MENSES 03/16/2007   URTICARIA  03/16/2007   MIGRAINES, HX OF 03/16/2007   Past Medical History:  Diagnosis Date   Allergic rhinitis    COPD (chronic obstructive pulmonary disease) (HCC)    Excessive or frequent menstruation    History of migraines    Hypothyroidism    Nonspecific abnormal electrocardiogram (ECG) (EKG)    Pure hypercholesterolemia    Tobacco use disorder    Unspecified adjustment reaction     Family History  Problem Relation Age of Onset   Depression Mother        ?   Other Father        No contact--unknown    Past Surgical History:  Procedure Laterality Date   HAMMER TOE SURGERY     PELVIC LAPAROSCOPY     TUBAL LIGATION     Social History   Occupational History   Occupation: Teacher, adult education: [employer]  Tobacco Use   Smoking status: Every Day   Smokeless tobacco: Never  Substance and Sexual Activity   Alcohol use: Yes   Drug use: No   Sexual activity: Not Currently    Birth control/protection: Abstinence

## 2021-01-28 ENCOUNTER — Telehealth: Payer: Self-pay | Admitting: Physical Medicine and Rehabilitation

## 2021-01-28 NOTE — Telephone Encounter (Signed)
Pt's husband Jorja Loa called about insurance approval. Pt is on wife demographics. Pt's husband is calling about an appt on her behalf. Please call pt at [phone].

## 2021-01-29 ENCOUNTER — Telehealth: Payer: Self-pay | Admitting: Orthopedic Surgery

## 2021-01-29 ENCOUNTER — Other Ambulatory Visit: Payer: Self-pay | Admitting: Surgical

## 2021-01-29 ENCOUNTER — Telehealth: Payer: Self-pay | Admitting: Surgical

## 2021-01-29 ENCOUNTER — Other Ambulatory Visit (HOSPITAL_COMMUNITY): Payer: Self-pay

## 2021-01-29 MED ORDER — ACETAMINOPHEN-CODEINE #3 300-30 MG PO TABS
1.0000 | ORAL_TABLET | Freq: Two times a day (BID) | ORAL | 0 refills | Status: DC | PRN
  Filled 2021-01-29: qty 30, 15d supply, fill #0

## 2021-01-29 NOTE — Telephone Encounter (Signed)
Pt husband called and states the medication needs to be sent to [facility] on [address].  CB [phone]

## 2021-01-29 NOTE — Telephone Encounter (Signed)
Must be tramadol.  I will prescribe tylenol with codeine but dont want to refill this after her c spine ESI

## 2021-01-29 NOTE — Telephone Encounter (Signed)
Contacted patients husband and informed him that I have removed [facility]. Primary pharmacy has been corrected in patient's chart. Please advise that you are able to resend Tylenol with codeine to updated pharmacy.

## 2021-01-29 NOTE — Telephone Encounter (Signed)
Patient's spouse Jorja Loa called advised patient is having a reaction to the pain medicine. Tim said the patient can not tolerate the medication and would like something else prescribed for her. Tim said he did not know the name of the medication and he's at work right now. The number to contact Jorja Loa is [phone]

## 2021-01-30 ENCOUNTER — Telehealth: Payer: Self-pay | Admitting: Surgical

## 2021-01-30 ENCOUNTER — Other Ambulatory Visit (HOSPITAL_COMMUNITY): Payer: Self-pay

## 2021-01-30 ENCOUNTER — Other Ambulatory Visit: Payer: Self-pay | Admitting: Surgical

## 2021-01-30 MED ORDER — ACETAMINOPHEN-CODEINE #3 300-30 MG PO TABS: 1.0000 | ORAL_TABLET | Freq: Two times a day (BID) | ORAL | 0 refills | Status: DC | PRN

## 2021-01-30 NOTE — Telephone Encounter (Signed)
Pt husband called back about the medication to see if it had been sent to the other [facility] [address].   CB [phone]

## 2021-01-30 NOTE — Telephone Encounter (Signed)
IC advised [facility] sent to correct pharmacy.

## 2021-01-30 NOTE — Telephone Encounter (Signed)
Sent it in and Lauren Beck called, thanks

## 2021-02-06 ENCOUNTER — Telehealth: Payer: Self-pay | Admitting: Orthopaedic Surgery

## 2021-02-06 NOTE — Telephone Encounter (Signed)
Pts husband called back stating that he still hasn't received a call yet. She wanted a sooner appt for a neck injection since she is in so much pain

## 2021-02-06 NOTE — Telephone Encounter (Signed)
Patient's husband called. He would like to know if Dr. Alvester Morin can get her in sooner. [phone]

## 2021-02-12 ENCOUNTER — Other Ambulatory Visit: Payer: Self-pay

## 2021-02-12 ENCOUNTER — Ambulatory Visit (INDEPENDENT_AMBULATORY_CARE_PROVIDER_SITE_OTHER): Payer: 59 | Admitting: Physical Medicine and Rehabilitation

## 2021-02-12 ENCOUNTER — Ambulatory Visit: Payer: Self-pay

## 2021-02-12 ENCOUNTER — Encounter: Payer: Self-pay | Admitting: Physical Medicine and Rehabilitation

## 2021-02-12 VITALS — BP 106/75 | HR 92

## 2021-02-12 DIAGNOSIS — M5412 Radiculopathy, cervical region: Secondary | ICD-10-CM | POA: Diagnosis not present

## 2021-02-12 MED ORDER — BETAMETHASONE SOD PHOS & ACET 6 (3-3) MG/ML IJ SUSP
12.0000 mg | Freq: Once | INTRAMUSCULAR | Status: AC
  Administered 2021-02-12: 12 mg

## 2021-02-12 NOTE — Patient Instructions (Signed)

## 2021-02-12 NOTE — Progress Notes (Signed)
Pt state neck pain that travels down to her right shoulder. Pt state any movement with her arm the pain gets worse. Pt state she takes pain meds to help ease her pain.  Numeric Pain Rating Scale and Functional Assessment Average Pain 5   In the last MONTH (on 0-10 scale) has pain interfered with the following?  1. General activity like being  able to carry out your everyday physical activities such as walking, climbing stairs, carrying groceries, or moving a chair?  Rating(10)   +Driver, -BT, -Dye Allergies.

## 2021-02-12 NOTE — Progress Notes (Signed)
Lauren Beck - 60 y.o. female MRN [id number]  Date of birth: [birth year]  Office Visit Note: Visit Date: 02/12/2021 PCP: Patient, No Pcp Per (Inactive) Referred by: Cammy Copa, MD  Subjective: Chief Complaint  Patient presents with   Neck - Pain   Right Shoulder - Pain   HPI:  Lauren Beck is a 60 y.o. female who comes in today at the request of Dr. Burnard Bunting for planned Right C7-T1 Cervical Interlaminar epidural steroid injection with fluoroscopic guidance.  The patient has failed conservative care including home exercise, medications, time and activity modification.  This injection will be diagnostic and hopefully therapeutic.  Please see requesting physician notes for further details and justification. MRI reviewed with images and spine model.  MRI reviewed in the note below.     ROS Otherwise per HPI.  Assessment & Plan: Visit Diagnoses:    ICD-10-CM   1. Cervical radiculopathy  M54.12 XR C-ARM NO REPORT    Epidural Steroid injection    betamethasone acetate-betamethasone sodium phosphate (CELESTONE) injection 12 mg      Plan: No additional findings.   Meds & Orders:  Meds ordered this encounter  Medications   betamethasone acetate-betamethasone sodium phosphate (CELESTONE) injection 12 mg    Orders Placed This Encounter  Procedures   XR C-ARM NO REPORT   Epidural Steroid injection    Follow-up: Return for visit to requesting physician as needed.   Procedures: No procedures performed  Cervical Epidural Steroid Injection - Interlaminar Approach with Fluoroscopic Guidance  Patient: Lauren Beck      Date of Birth: [birth year] MRN: [id number] PCP: Patient, No Pcp Per (Inactive)      Visit Date: 02/12/2021   Universal Protocol:    Date/Time: 08/23/221:06 PM  Consent Given By: the patient  Position: PRONE  Additional Comments: Vital signs were monitored before and after the procedure. Patient was prepped and draped in the usual sterile  fashion. The correct patient, procedure, and site was verified.   Injection Procedure Details:   Procedure diagnoses: Cervical radiculopathy [M54.12]    Meds Administered:  Meds ordered this encounter  Medications   betamethasone acetate-betamethasone sodium phosphate (CELESTONE) injection 12 mg     Laterality: Right  Location/Site: C7-T1  Needle: 3.5 in., 20 ga. Tuohy  Needle Placement: Paramedian epidural space  Findings:  -Comments: Excellent flow of contrast into the epidural space.  Procedure Details: Using a paramedian approach from the side mentioned above, the region overlying the inferior lamina was localized under fluoroscopic visualization and the soft tissues overlying this structure were infiltrated with 4 ml. of 1% Lidocaine without Epinephrine. A # 20 gauge, Tuohy needle was inserted into the epidural space using a paramedian approach.  The epidural space was localized using loss of resistance along with contralateral oblique bi-planar fluoroscopic views.  After negative aspirate for air, blood, and CSF, a 2 ml. volume of Isovue-250 was injected into the epidural space and the flow of contrast was observed. Radiographs were obtained for documentation purposes.   The injectate was administered into the level noted above.  Additional Comments:  The patient tolerated the procedure well Dressing: 2 x 2 sterile gauze and Band-Aid    Post-procedure details: Patient was observed during the procedure. Post-procedure instructions were reviewed.  Patient left the clinic in stable condition.   Clinical History: MRI CERVICAL SPINE WITHOUT CONTRAST   TECHNIQUE: Multiplanar, multisequence MR imaging of the cervical spine was performed. No intravenous contrast was administered.   COMPARISON:  Radiographs  of the cervical spine 11/23/2020.   FINDINGS: Alignment: Straightening of the expected cervical lordosis. Trace C4-C5 grade 1 retrolisthesis.   Vertebrae:  Vertebral body height is maintained. Mild degenerative endplate edema at J5-K0. Elsewhere, no significant marrow edema or focal suspicious osseous lesion is identified.   Cord: No spinal cord signal abnormality is identified.   Posterior Fossa, vertebral arteries, paraspinal tissues: No abnormality identified within included portions of the posterior fossa. Mild sphenoid sinus mucosal thickening. Flow voids preserved within the imaged cervical vertebral arteries. Paraspinal soft tissues within normal limits.   Disc levels:   Mild disc degeneration throughout the cervical spine, greatest at C4-C5.   C2-C3: No significant disc herniation or stenosis.   C3-C4: Shallow disc bulge. No significant spinal canal or foraminal stenosis.   C4-C5: Disc bulge with bilateral disc osteophyte ridge/uncinate hypertrophy. Minimal ligamentum flavum hypertrophy. Mild spinal canal stenosis without spinal cord mass effect. Bilateral neural foraminal narrowing (mild/moderate right, moderate/severe left).   C5-C6: Disc bulge. Bilateral disc osteophyte ridge/uncinate hypertrophy. Ligamentum flavum hypertrophy. Mild spinal canal stenosis without spinal cord mass effect. Bilateral neural foraminal narrowing (moderate right, moderate/severe left).   C6-C7: Shallow disc bulge. Mild bilateral uncovertebral hypertrophy. Ligamentum flavum hypertrophy. No significant spinal canal stenosis. Mild left neural foraminal narrowing.   C7-T1: No significant disc herniation or stenosis.   IMPRESSION: Cervical spondylosis, as outlined and with findings most notably as follows.   At C4-C5, there is trace grade 1 retrolisthesis. Mild disc degeneration. Disc bulge with bilateral disc osteophyte ridge/uncinate hypertrophy. Ligamentum flavum hypertrophy. Mild spinal canal stenosis without spinal cord mass effect. Bilateral neural foraminal narrowing (mild/moderate right, moderate/severe left).   At C5-C6, there is  mild disc degeneration with trace degenerative endplate edema. Disc bulge with bilateral disc osteophyte ridge/uncinate hypertrophy. Ligamentum flavum hypertrophy. Mild spinal canal stenosis without spinal cord mass effect. Bilateral neural foraminal narrowing (moderate right, moderate/severe left).   No significant spinal canal stenosis at the remaining levels. Mild left neural foraminal narrowing at C6-C7.     Electronically Signed   By: Jackey Loge DO   On: 01/10/2021 10:47     Objective:  VS:  HT:    WT:   BMI:     BP:106/75  HR:92bpm  TEMP: ( )  RESP:  Physical Exam Vitals and nursing note reviewed.  Constitutional:      General: She is not in acute distress.    Appearance: Normal appearance. She is not ill-appearing.  HENT:     Head: Normocephalic and atraumatic.     Right Ear: External ear normal.     Left Ear: External ear normal.  Eyes:     Extraocular Movements: Extraocular movements intact.  Cardiovascular:     Rate and Rhythm: Normal rate.     Pulses: Normal pulses.  Musculoskeletal:     Cervical back: Tenderness present. No rigidity.     Right lower leg: No edema.     Left lower leg: No edema.     Comments: Patient has good strength in the upper extremities including 5 out of 5 strength in wrist extension long finger flexion and APB.  There is no atrophy of the hands intrinsically.  There is a negative Hoffmann's test.   Lymphadenopathy:     Cervical: No cervical adenopathy.  Skin:    Findings: No erythema, lesion or rash.  Neurological:     General: No focal deficit present.     Mental Status: She is alert and oriented to person, place, and  time.     Sensory: No sensory deficit.     Motor: No weakness or abnormal muscle tone.     Coordination: Coordination normal.  Psychiatric:        Mood and Affect: Mood normal.        Behavior: Behavior normal.     Imaging: No results found.

## 2021-02-12 NOTE — Procedures (Signed)
Cervical Epidural Steroid Injection - Interlaminar Approach with Fluoroscopic Guidance  Patient: Lauren Beck      Date of Birth: 06-Apr-1961 MRN: [id number] PCP: Patient, No Pcp Per (Inactive)      Visit Date: 02/12/2021   Universal Protocol:    Date/Time: 08/23/221:06 PM  Consent Given By: the patient  Position: PRONE  Additional Comments: Vital signs were monitored before and after the procedure. Patient was prepped and draped in the usual sterile fashion. The correct patient, procedure, and site was verified.   Injection Procedure Details:   Procedure diagnoses: Cervical radiculopathy [M54.12]    Meds Administered:  Meds ordered this encounter  Medications   betamethasone acetate-betamethasone sodium phosphate (CELESTONE) injection 12 mg     Laterality: Right  Location/Site: C7-T1  Needle: 3.5 in., 20 ga. Tuohy  Needle Placement: Paramedian epidural space  Findings:  -Comments: Excellent flow of contrast into the epidural space.  Procedure Details: Using a paramedian approach from the side mentioned above, the region overlying the inferior lamina was localized under fluoroscopic visualization and the soft tissues overlying this structure were infiltrated with 4 ml. of 1% Lidocaine without Epinephrine. A # 20 gauge, Tuohy needle was inserted into the epidural space using a paramedian approach.  The epidural space was localized using loss of resistance along with contralateral oblique bi-planar fluoroscopic views.  After negative aspirate for air, blood, and CSF, a 2 ml. volume of Isovue-250 was injected into the epidural space and the flow of contrast was observed. Radiographs were obtained for documentation purposes.   The injectate was administered into the level noted above.  Additional Comments:  The patient tolerated the procedure well Dressing: 2 x 2 sterile gauze and Band-Aid    Post-procedure details: Patient was observed during the  procedure. Post-procedure instructions were reviewed.  Patient left the clinic in stable condition.

## 2021-03-12 ENCOUNTER — Other Ambulatory Visit (HOSPITAL_COMMUNITY): Payer: Self-pay

## 2021-06-04 ENCOUNTER — Encounter (HOSPITAL_COMMUNITY): Payer: Self-pay | Admitting: Emergency Medicine

## 2021-06-04 ENCOUNTER — Ambulatory Visit (HOSPITAL_COMMUNITY)
Admission: EM | Admit: 2021-06-04 | Discharge: 2021-06-04 | Disposition: A | Discharge status: Home / Self Care | Payer: 59 | Attending: Family Medicine | Admitting: Family Medicine

## 2021-06-04 ENCOUNTER — Other Ambulatory Visit: Payer: Self-pay

## 2021-06-04 DIAGNOSIS — J441 Chronic obstructive pulmonary disease with (acute) exacerbation: Secondary | ICD-10-CM | POA: Diagnosis not present

## 2021-06-04 DIAGNOSIS — J0120 Acute ethmoidal sinusitis, unspecified: Secondary | ICD-10-CM | POA: Diagnosis not present

## 2021-06-04 DIAGNOSIS — R052 Subacute cough: Secondary | ICD-10-CM

## 2021-06-04 DIAGNOSIS — R509 Fever, unspecified: Secondary | ICD-10-CM | POA: Diagnosis not present

## 2021-06-04 MED ORDER — DOXYCYCLINE HYCLATE 100 MG PO CAPS: 100.0000 mg | ORAL_CAPSULE | Freq: Two times a day (BID) | ORAL | 0 refills | Status: AC

## 2021-06-04 MED ORDER — PREDNISONE 20 MG PO TABS: 40.0000 mg | ORAL_TABLET | Freq: Every day | ORAL | 0 refills | Status: AC

## 2021-06-04 NOTE — ED Provider Notes (Signed)
[facility]    CSN: [id number] Arrival date & time: 06/04/21  1106      History   Chief Complaint Chief Complaint  Patient presents with   Cough    HPI Lauren Beck is a 60 y.o. female.   5 days ago she started with runny nose, congestion.  Then with chills, low grade fever.  She did a home covid test, which was negative over the weekend.  Then started with cough, along with runny nose, headache, slight diarrhea.  + wheezing and sob;  she does smoke, h/o copd.  Last fever was several days ago;  she is feeling better, but still with cough;  No n/v.  No recent body aches;  Taking otc meds without much help;  she has been using her ventolin inhaler with some help;   Past Medical History:  Diagnosis Date   Allergic rhinitis    COPD (chronic obstructive pulmonary disease) (HCC)    Excessive or frequent menstruation    History of migraines    Hypothyroidism    Nonspecific abnormal electrocardiogram (ECG) (EKG)    Pure hypercholesterolemia    Tobacco use disorder    Unspecified adjustment reaction     Patient Active Problem List   Diagnosis Date Noted   Stress reaction, emotional 12/26/2011   Neck pain 06/10/2011   COUGH 06/11/2010   SINUSITIS - ACUTE-NOS 05/24/2010   EXCESSIVE OR FREQUENT MENSTRUATION 03/05/2009   PURE HYPERCHOLESTEROLEMIA 02/29/2008   STRESS ELECTROCARDIOGRAM, ABNORMAL 03/23/2007   HYPOTHYROIDISM 03/16/2007   TOBACCO ABUSE 03/16/2007   REACTION, ADJUSTMENT NOS 03/16/2007   ALLERGIC RHINITIS 03/16/2007   COPD 03/16/2007   IRREGULAR MENSES 03/16/2007   URTICARIA 03/16/2007   MIGRAINES, HX OF 03/16/2007    Past Surgical History:  Procedure Laterality Date   HAMMER TOE SURGERY     PELVIC LAPAROSCOPY     TUBAL LIGATION      OB History   No obstetric history on file.      Home Medications    Prior to Admission medications   Medication Sig Start Date End Date Taking? Authorizing Provider  acetaminophen (TYLENOL) 500 MG  tablet Take 1,500 mg by mouth every 6 (six) hours as needed for mild pain or headache.     [provider]  acetaminophen-codeine (TYLENOL #3) 300-30 MG tablet Take 1 tablet by mouth every 12 (twelve) hours as needed for moderate pain. 01/30/21   Magnant, Charles L, PA-C  bismuth subsalicylate (PEPTO BISMOL) 262 MG/15ML suspension Take 15 mLs by mouth every 6 (six) hours as needed for indigestion or diarrhea or loose stools.    [provider]  esomeprazole (NEXIUM) 40 MG capsule Take 1 capsule (40 mg total) by mouth daily. 05/05/14   Mancel Bale, MD  levothyroxine (SYNTHROID, LEVOTHROID) 100 MCG tablet Take 100 mcg by mouth daily. 02/04/12 02/03/13  Tower, Audrie Gallus, MD  Multiple Vitamin (MULTIVITAMIN WITH MINERALS) TABS tablet Take 1 tablet by mouth daily.    [provider]    Family History Family History  Problem Relation Age of Onset   Depression Mother        ?   Other Father        No contact--unknown    Social History Social History   Tobacco Use   Smoking status: Every Day   Smokeless tobacco: Never  Vaping Use   Vaping Use: Some days  Substance Use Topics   Alcohol use: Yes   Drug use: No     Allergies  Benadryl [diphenhydramine hcl]   Review of Systems Review of Systems  Constitutional:  Positive for fatigue and fever.  HENT:  Positive for congestion. Negative for ear pain, sinus pressure and sinus pain.   Respiratory:  Positive for cough and wheezing.   Cardiovascular: Negative.   Gastrointestinal:  Positive for diarrhea.  Genitourinary: Negative.     Physical Exam Triage Vital Signs ED Triage Vitals [06/04/21 1241]  Enc Vitals Group     BP      Pulse      Resp      Temp      Temp src      SpO2      Weight      Height      Head Circumference      Peak Flow      Pain Score 10     Pain Loc      Pain Edu?      Excl. in GC?    No data found.  Updated Vital Signs LMP 04/06/2012      Physical Exam Constitutional:       Appearance: Normal appearance.  HENT:     Head: Normocephalic and atraumatic.     Right Ear: Tympanic membrane normal.     Left Ear: Tympanic membrane normal.     Nose: Congestion present.     Comments: + sinus tenderness bilaterally    Mouth/Throat:     Mouth: Mucous membranes are moist.  Cardiovascular:     Rate and Rhythm: Normal rate and regular rhythm.  Pulmonary:     Effort: Pulmonary effort is normal.     Breath sounds: Normal breath sounds.  Musculoskeletal:     Cervical back: Normal range of motion and neck supple.  Neurological:     Mental Status: She is alert.     UC Treatments / Results  Labs (all labs ordered are listed, but only abnormal results are displayed) Labs Reviewed - No data to display  EKG   Radiology No results found.  Procedures Procedures (including critical care time)  Medications Ordered in UC Medications - No data to display  Initial Impression / Assessment and Plan / UC Course  I have reviewed the triage vital signs and the nursing notes.  Pertinent labs & imaging results that were available during my care of the patient were reviewed by me and considered in my medical decision making (see chart for details).  Patient is overall improved today from her cough, fever, but still with wheezing, and sinus congestion.  Higher risk given her history of smoking and copd.  Will treat with doxy x 10 days, along with prednisone for reported wheezing and sob.  Will continue her home albuterol HFA.  Will follow up with her pcp if not improving.   Final Clinical Impressions(s) / UC Diagnoses   Final diagnoses:  Subacute cough  Fever, unspecified  COPD exacerbation (HCC)  Acute non-recurrent ethmoidal sinusitis  Bronchitis, chronic obstructive, with exacerbation (HCC)     Discharge Instructions      You have been diagnosed with sinus infection and bronchitis.  I have sent out an antibiotic for you to take twice/day x 10 days.  I have  also sent out 5 days of prednisone.  Please also use your inhaler as needed for wheezing and shortness of breath.  Please rest and get plenty of fluids.  You may continue mucinex as needed for sinus congestion and drainage.     ED Prescriptions  Medication Sig Dispense Auth. Provider   doxycycline (VIBRAMYCIN) 100 MG capsule Take 1 capsule (100 mg total) by mouth 2 (two) times daily for 10 days. 20 capsule Toniesha Zellner, MD   predniSONE (DELTASONE) 20 MG tablet Take 2 tablets (40 mg total) by mouth daily for 5 days. 10 tablet Jannifer Franklin, MD      PDMP not reviewed this encounter.   Jannifer Franklin, MD 06/26/21 [phone]

## 2021-06-04 NOTE — Discharge Instructions (Addendum)
You have been diagnosed with sinus infection and bronchitis.  I have sent out an antibiotic for you to take twice/day x 10 days.  I have also sent out 5 days of prednisone.  Please also use your inhaler as needed for wheezing and shortness of breath.  Please rest and get plenty of fluids.  You may continue mucinex as needed for sinus congestion and drainage.

## 2021-06-04 NOTE — ED Triage Notes (Signed)
05/30/2021 had onset of symptoms.  Initially had a fever, cough, runny nose, slight headache, minimal diarrhea.  Has been taking tylenol, mucinex, nyquil and drinking pedialyte

## 2021-11-14 ENCOUNTER — Encounter: Payer: Self-pay | Admitting: Family

## 2021-11-14 ENCOUNTER — Ambulatory Visit (INDEPENDENT_AMBULATORY_CARE_PROVIDER_SITE_OTHER): Payer: 59 | Admitting: Family

## 2021-11-14 VITALS — BP 122/70 | HR 70 | Temp 97.7°F | Resp 16 | Ht 61.0 in | Wt 136.5 lb

## 2021-11-14 DIAGNOSIS — R1010 Upper abdominal pain, unspecified: Secondary | ICD-10-CM | POA: Diagnosis not present

## 2021-11-14 DIAGNOSIS — E039 Hypothyroidism, unspecified: Secondary | ICD-10-CM

## 2021-11-14 DIAGNOSIS — F17200 Nicotine dependence, unspecified, uncomplicated: Secondary | ICD-10-CM | POA: Diagnosis not present

## 2021-11-14 DIAGNOSIS — J301 Allergic rhinitis due to pollen: Secondary | ICD-10-CM | POA: Insufficient documentation

## 2021-11-14 DIAGNOSIS — Z1211 Encounter for screening for malignant neoplasm of colon: Secondary | ICD-10-CM

## 2021-11-14 DIAGNOSIS — M542 Cervicalgia: Secondary | ICD-10-CM

## 2021-11-14 DIAGNOSIS — R3915 Urgency of urination: Secondary | ICD-10-CM

## 2021-11-14 DIAGNOSIS — K219 Gastro-esophageal reflux disease without esophagitis: Secondary | ICD-10-CM

## 2021-11-14 DIAGNOSIS — D649 Anemia, unspecified: Secondary | ICD-10-CM

## 2021-11-14 DIAGNOSIS — E782 Mixed hyperlipidemia: Secondary | ICD-10-CM

## 2021-11-14 DIAGNOSIS — Z780 Asymptomatic menopausal state: Secondary | ICD-10-CM

## 2021-11-14 DIAGNOSIS — R1013 Epigastric pain: Secondary | ICD-10-CM

## 2021-11-14 LAB — COMPREHENSIVE METABOLIC PANEL
ALT: 27 U/L (ref 0–35)
AST: 38 U/L — ABNORMAL HIGH (ref 0–37)
Albumin: 4.4 g/dL (ref 3.5–5.2)
Alkaline Phosphatase: 93 U/L (ref 39–117)
BUN: 20 mg/dL (ref 6–23)
CO2: 30 mEq/L (ref 19–32)
Calcium: 9.5 mg/dL (ref 8.4–10.5)
Chloride: 104 mEq/L (ref 96–112)
Creatinine, Ser: 1.12 mg/dL (ref 0.40–1.20)
GFR: 53.56 mL/min — ABNORMAL LOW (ref >60.00)
Glucose, Bld: 79 mg/dL (ref 70–99)
Potassium: 4.4 mEq/L (ref 3.5–5.1)
Sodium: 141 mEq/L (ref 135–145)
Total Bilirubin: 0.4 mg/dL (ref 0.2–1.2)
Total Protein: 6.9 g/dL (ref 6.0–8.3)

## 2021-11-14 LAB — LIPID PANEL
Cholesterol: 279 mg/dL — ABNORMAL HIGH (ref 0–200)
HDL: 67.4 mg/dL (ref >39.00)
LDL Cholesterol: 186 mg/dL — ABNORMAL HIGH (ref 0–99)
NonHDL: 211.89
Total CHOL/HDL Ratio: 4
Triglycerides: 131 mg/dL (ref 0.0–149.0)
VLDL: 26.2 mg/dL (ref 0.0–40.0)

## 2021-11-14 LAB — CBC WITH DIFFERENTIAL/PLATELET
Basophils Absolute: 0 10*3/uL (ref 0.0–0.1)
Basophils Relative: 0.5 % (ref 0.0–3.0)
Eosinophils Absolute: 0.2 10*3/uL (ref 0.0–0.7)
Eosinophils Relative: 2.7 % (ref 0.0–5.0)
HCT: 40 % (ref 36.0–46.0)
Hemoglobin: 13.2 g/dL (ref 12.0–15.0)
Lymphocytes Relative: 39.3 % (ref 12.0–46.0)
Lymphs Abs: 2.8 10*3/uL (ref 0.7–4.0)
MCHC: 33 g/dL (ref 30.0–36.0)
MCV: 100.9 fl — ABNORMAL HIGH (ref 78.0–100.0)
Monocytes Absolute: 0.5 10*3/uL (ref 0.1–1.0)
Monocytes Relative: 6.8 % (ref 3.0–12.0)
Neutro Abs: 3.6 10*3/uL (ref 1.4–7.7)
Neutrophils Relative %: 50.7 % (ref 43.0–77.0)
Platelets: 239 10*3/uL (ref 150.0–400.0)
RBC: 3.97 Mil/uL (ref 3.87–5.11)
RDW: 13.4 % (ref 11.5–15.5)
WBC: 7.1 10*3/uL (ref 4.0–10.5)

## 2021-11-14 LAB — AMYLASE: Amylase: 35 U/L (ref 27–131)

## 2021-11-14 LAB — TSH: TSH: 79.98 u[IU]/mL — ABNORMAL HIGH (ref 0.35–5.50)

## 2021-11-14 MED ORDER — PANTOPRAZOLE SODIUM 40 MG PO TBEC: 40.0000 mg | DELAYED_RELEASE_TABLET | Freq: Every day | ORAL | 3 refills | Status: DC

## 2021-11-14 NOTE — Assessment & Plan Note (Signed)
rx protonix 40 mg  Try to decrease and or avoid spicy foods, fried fatty foods, and also caffeine and chocolate as these can increase heartburn symptoms.

## 2021-11-14 NOTE — Assessment & Plan Note (Signed)
Ordered lipid panel, pending results. Work on low cholesterol diet and exercise as tolerated ? ?

## 2021-11-14 NOTE — Assessment & Plan Note (Signed)
US abdomen ordered pending results Amylase lipase cbc cmp h pyolori ordered Pending results

## 2021-11-14 NOTE — Assessment & Plan Note (Signed)
Advised pt to f/u with orthopedist for this  Has had injections in past Continue wearing brace

## 2021-11-14 NOTE — Assessment & Plan Note (Signed)
Order cbc pending results. 

## 2021-11-14 NOTE — Patient Instructions (Addendum)
Welcome to our clinic, I am happy to have you as my new patient. I am excited to continue on this healthcare journey with you.  I ordered an abdominal ultrasound to look at your liver and gallbladder.  Please call the following location to schedule: it is called an abdominal ultrasound.   [facility] off [address], [place] [address] Phone [phone]-  8-5 pm   Recommend you start daily protonix (RX sent in) , stop nexium. Take this daily.  You should be contacted about your referral for colonoscopy within the next week.    A referral was placed today for lung cancer screening. Please let us know if you have not heard back within 2 weeks about the referral.  Stop by the lab prior to leaving today. I will notify you of your results once received.   Please keep in mind Any my chart messages you send have up to a three business day turnaround for a response.  Phone calls may take up to a one full business day turnaround for a  response.   If you need a medication refill I recommend you request it through the pharmacy as this is easiest for [place] rather than sending a message and or phone call.   Due to recent changes in healthcare laws, you may see results of your imaging and/or laboratory studies on MyChart before I have had a chance to review them.  I understand that in some cases there may be results that are confusing or concerning to you. Please understand that not all results are received at the same time and often I may need to interpret multiple results in order to provide you with the best plan of care or course of treatment. Therefore, I ask that you please give me 2 business days to thoroughly review all your results before contacting my office for clarification. Should we see a critical lab result, you will be contacted sooner.   It was a pleasure seeing you today! Please do not hesitate to reach out with any questions and or  concerns.  Regards,   [name] FNP-C

## 2021-11-14 NOTE — Assessment & Plan Note (Signed)
Ordered colonoscopy

## 2021-11-14 NOTE — Assessment & Plan Note (Signed)
Referral to lung screening clinic 

## 2021-11-14 NOTE — Assessment & Plan Note (Signed)
Recommend daily flonase along with nightly zyrtec

## 2021-11-14 NOTE — Assessment & Plan Note (Signed)
Been off meds for years,  Order tsh pending results Plan to restart based off of lab work results Pt is symptomatic

## 2021-11-14 NOTE — Assessment & Plan Note (Signed)
poct urine dip in office Urine culture ordered pending results  

## 2021-11-14 NOTE — Addendum Note (Signed)
Addended by: Alvina Chou on: 11/14/2021 12:08 PM   Modules accepted: Orders

## 2021-11-14 NOTE — Assessment & Plan Note (Signed)
Pylori ordered

## 2021-11-14 NOTE — Progress Notes (Signed)
New Patient Office Visit  Subjective:  Patient ID: Lauren Beck, female    DOB: April 14, 1961  Age: 61 y.o. MRN: [id number]  CC:  Chief Complaint  Patient presents with   Establish Care    HPI Lauren Beck is here to establish care as a new patient.  Prior provider was: Dr. Glori Bickers a long time ago.  Pt is without acute concerns.   Does have urinary urgency  Some incontinence No dysuria.  Some frequency.   chronic concerns:  Hypothyroid: not taking levothyroxine 100 mcg, has not taken in a few years was without insurance.   Copd: cough a lot, dry cough. Does not see pulmonologist. At times doe. Still a smoker, has been 40 ppd   GERD: not helping with nexium over the counter and or pepto-bismal on a daily basis, doesn't really help her at all. pt does state had h/o ulcers. Pt poor historian but states went to ER and was told she had an ulcer, she did not follow up with GI after this. Not currently throwing up, does have increased acid. Epigastric tenderness. Doesn't burp often. Sometimes pain after eating, certain foods trigger this more than others.       11/14/2021   10:58 AM  GAD 7 : Generalized Anxiety Score  Nervous, Anxious, on Edge 0  Control/stop worrying 0  Worry too much - different things 0  Trouble relaxing 0  Restless 0  Easily annoyed or irritable 0  Afraid - awful might happen 0  Total GAD 7 Score 0  Anxiety Difficulty Not difficult at all       11/14/2021   10:58 AM  PHQ9 SCORE ONLY  PHQ-9 Total Score 0    Past Medical History:  Diagnosis Date   Allergic rhinitis    COPD (chronic obstructive pulmonary disease) (HCC)    Excessive or frequent menstruation    History of migraines    Hypothyroidism    Nonspecific abnormal electrocardiogram (ECG) (EKG)    Pure hypercholesterolemia    Tobacco use disorder    Unspecified adjustment reaction     Past Surgical History:  Procedure Laterality Date   HAMMER TOE SURGERY     PELVIC LAPAROSCOPY      exploratory, thought might have a cyst, not seen   TUBAL LIGATION      Family History  Problem Relation Age of Onset   Other Father        No contact--unknown    Social History   Socioeconomic History   Marital status: Divorced    Spouse name: Not on file   Number of children: 3   Years of education: Not on file   Highest education level: Not on file  Occupational History   Occupation: Airline pilot: [employer]  Tobacco Use   Smoking status: Every Day    Types: Cigarettes    Start date: 1980   Smokeless tobacco: Never  Vaping Use   Vaping Use: Some days   Substances: Nicotine  Substance and Sexual Activity   Alcohol use: Yes    Alcohol/week: 1.0 standard drink    Types: 1 Glasses of wine per week    Comment: wine   Drug use: No    Comment: tried pot once, didn't like it   Sexual activity: Yes    Partners: Male    Birth control/protection: Surgical  Other Topics Concern   Not on file  Social History Narrative   Married--much stress at home with  marriage      3 children         Social Determinants of Radio broadcast assistant Strain: Not on file  Food Insecurity: Not on file  Transportation Needs: Not on file  Physical Activity: Not on file  Stress: Not on file  Social Connections: Not on file  Intimate Partner Violence: Not on file    Outpatient Medications Prior to Visit  Medication Sig Dispense Refill   acetaminophen (TYLENOL) 500 MG tablet Take 500 mg by mouth every 6 (six) hours as needed.     bismuth subsalicylate (PEPTO BISMOL) 262 MG/15ML suspension Take 15 mLs by mouth every 6 (six) hours as needed for indigestion or diarrhea or loose stools.     Multiple Vitamin (MULTIVITAMIN WITH MINERALS) TABS tablet Take 1 tablet by mouth daily.     acetaminophen (TYLENOL) 500 MG tablet Take 1,500 mg by mouth every 6 (six) hours as needed for mild pain or headache.      acetaminophen-codeine (TYLENOL #3) 300-30 MG tablet Take 1  tablet by mouth every 12 (twelve) hours as needed for moderate pain. 30 tablet 0   esomeprazole (NEXIUM) 40 MG capsule Take 1 capsule (40 mg total) by mouth daily. 30 capsule 0   levothyroxine (SYNTHROID, LEVOTHROID) 100 MCG tablet Take 100 mcg by mouth daily.     No facility-administered medications prior to visit.    Allergies  Allergen Reactions   Benadryl [Diphenhydramine Hcl] Other (See Comments)    Makes patient extremely drowsy         Objective:    Physical Exam Vitals reviewed.  Constitutional:      General: She is not in acute distress.    Appearance: Normal appearance. She is normal weight. She is not ill-appearing, toxic-appearing or diaphoretic.  HENT:     Right Ear: Tympanic membrane normal.     Left Ear: Tympanic membrane normal.     Mouth/Throat:     Mouth: Mucous membranes are moist.     Pharynx: No pharyngeal swelling.     Tonsils: No tonsillar exudate.  Eyes:     Extraocular Movements: Extraocular movements intact.     Conjunctiva/sclera: Conjunctivae normal.     Pupils: Pupils are equal, round, and reactive to light.  Neck:     Thyroid: No thyroid mass.  Cardiovascular:     Rate and Rhythm: Normal rate and regular rhythm.  Pulmonary:     Effort: Pulmonary effort is normal.     Breath sounds: Normal breath sounds.  Abdominal:     General: Abdomen is flat. Bowel sounds are normal. There is no distension.     Palpations: Abdomen is soft. There is no mass.     Tenderness: There is abdominal tenderness (moderate ruq mild luq moderate llq, epigastric moderate). There is no right CVA tenderness, left CVA tenderness, guarding or rebound.  Musculoskeletal:        General: Normal range of motion.  Lymphadenopathy:     Cervical:     Right cervical: No superficial cervical adenopathy.    Left cervical: No superficial cervical adenopathy.  Skin:    General: Skin is warm.     Capillary Refill: Capillary refill takes less than 2 seconds.  Neurological:      General: No focal deficit present.     Mental Status: She is alert and oriented to person, place, and time.  Psychiatric:        Mood and Affect: Mood normal.  Behavior: Behavior normal.        Thought Content: Thought content normal.        Judgment: Judgment normal.     BP 122/70   Pulse 70   Temp 97.7 F (36.5 C)   Resp 16   Ht 5\' 1"  (1.549 m)   Wt 136 lb 8 oz (61.9 kg)   LMP 04/06/2012   SpO2 97%   BMI 25.79 kg/m  Wt Readings from Last 3 Encounters:  11/14/21 136 lb 8 oz (61.9 kg)  09/25/19 115 lb (52.2 kg)  10/03/12 105 lb (47.6 kg)     Health Maintenance Due  Topic Date Due   COVID-19 Vaccine (1) Never done   HIV Screening  Never done   Hepatitis C Screening  Never done   COLONOSCOPY (Pts 45-30yrs Insurance coverage will need to be confirmed)  Never done   PAP SMEAR-Modifier  10/30/2008   MAMMOGRAM  09/04/2011   Zoster Vaccines- Shingrix (1 of 2) Never done    There are no preventive care reminders to display for this patient.  Lab Results  Component Value Date   TSH 0.78 08/11/2011   Lab Results  Component Value Date   WBC 10.0 05/05/2014   HGB 14.2 05/05/2014   HCT 42.1 05/05/2014   MCV 101.0 (H) 05/05/2014   PLT 250 05/05/2014   Lab Results  Component Value Date   NA 138 05/05/2014   K 4.6 05/05/2014   CO2 24 05/05/2014   GLUCOSE 93 05/05/2014   BUN 17 05/05/2014   CREATININE 0.99 05/05/2014   BILITOT 0.3 05/05/2014   ALKPHOS 87 05/05/2014   AST 15 05/05/2014   ALT 9 05/05/2014   PROT 7.4 05/05/2014   ALBUMIN 3.9 05/05/2014   CALCIUM 9.8 05/05/2014   ANIONGAP 13 05/05/2014   Lab Results  Component Value Date   CHOL 246 (HH) 02/29/2008   Lab Results  Component Value Date   HDL 59.7 02/29/2008   No results found for: [facility] Lab Results  Component Value Date   TRIG 175 (H) 02/29/2008   Lab Results  Component Value Date   CHOLHDL 4.1 CALC 02/29/2008   No results found for: HGBA1C    Assessment & Plan:   Problem  List Items Addressed This Visit       Respiratory   Seasonal allergic rhinitis due to pollen    Recommend daily flonase along with nightly zyrtec         Digestive   Gastroesophageal reflux disease    rx protonix 40 mg  Try to decrease and or avoid spicy foods, fried fatty foods, and also caffeine and chocolate as these can increase heartburn symptoms.         Relevant Medications   pantoprazole (PROTONIX) 40 MG tablet   Other Relevant Orders   H. pylori breath test   Ambulatory referral to Gastroenterology   US Abdomen Complete     Endocrine   Acquired hypothyroidism    Been off meds for years,  Order tsh pending results Plan to restart based off of lab work results Pt is symptomatic       Relevant Orders   TSH     Other   Postmenopausal - Primary   Anemia    Order cbc pending results       Relevant Orders   CBC with Differential/Platelet   Mixed hyperlipidemia    Ordered lipid panel, pending results. Work on low cholesterol diet and exercise as tolerated  Relevant Orders   Comprehensive metabolic panel   Lipid panel   Current smoker    Referral to lung screening clinic         Relevant Orders   Ambulatory Referral Lung Cancer Screening [facility]   Epigastric pain    Pylori ordered        Relevant Medications   pantoprazole (PROTONIX) 40 MG tablet   Other Relevant Orders   H. pylori breath test   Ambulatory referral to Gastroenterology   Screening for malignant neoplasm of colon    Ordered colonoscopy         Relevant Orders   Ambulatory referral to Gastroenterology   Cervicalgia    Advised pt to f/u with orthopedist for this  Has had injections in past Continue wearing brace       Urinary urgency    poct urine dip in office Urine culture ordered pending results       Relevant Orders   Urine Culture   Urinalysis, Routine w reflex microscopic   Upper abdominal pain    US abdomen ordered pending  results Amylase lipase cbc cmp h pyolori ordered Pending results       Relevant Orders   US Abdomen Complete   Lipase   Amylase    Meds ordered this encounter  Medications   pantoprazole (PROTONIX) 40 MG tablet    Sig: Take 1 tablet (40 mg total) by mouth daily.    Dispense:  30 tablet    Refill:  3    Order Specific Question:   Supervising Provider    Answer:   Diona Browner, AMY E [id number]    Follow-up: Return in about 3 months (around 02/14/2022) for regular follow up on problems .    Eugenia Pancoast, FNP

## 2021-11-15 LAB — LIPASE: Lipase: 25 U/L (ref 11.0–59.0)

## 2021-11-19 ENCOUNTER — Other Ambulatory Visit: Payer: Self-pay | Admitting: Family

## 2021-11-19 DIAGNOSIS — E039 Hypothyroidism, unspecified: Secondary | ICD-10-CM

## 2021-11-19 DIAGNOSIS — E782 Mixed hyperlipidemia: Secondary | ICD-10-CM

## 2021-11-19 MED ORDER — ATORVASTATIN CALCIUM 20 MG PO TABS: 20.0000 mg | ORAL_TABLET | Freq: Every day | ORAL | 3 refills | Status: DC

## 2021-11-19 MED ORDER — LEVOTHYROXINE SODIUM 100 MCG PO TABS: 100.0000 ug | ORAL_TABLET | Freq: Every day | ORAL | 1 refills | Status: DC

## 2021-11-19 NOTE — Progress Notes (Signed)
Was pt fasting during blood draw? If yes we really need to consider starting a statin such as lipitor.  Ldl is 186, goal is less than 100. Very high.  If was fasting is pt willing to start? If she wasn't fasting we will repeat fasting at next f/u  Thyroid level is 79. Pt has been without for a long while. Restart levothyroxine 100 mcg once daily. Do not take with food or other meds by at least 30 minutes. Do not take with protonix or pepto bismal by at least four hours.  Lab repeat in four weeks, putting in order now, schedule lab only.  Kidney function a bit low, pending urine culture.

## 2021-11-20 LAB — H. PYLORI BREATH TEST: H. pylori Breath Test: NOT DETECTED

## 2021-11-21 NOTE — Progress Notes (Signed)
H pylori negative

## 2021-11-22 ENCOUNTER — Other Ambulatory Visit: Payer: 59

## 2021-11-22 DIAGNOSIS — R3915 Urgency of urination: Secondary | ICD-10-CM

## 2021-11-22 NOTE — Addendum Note (Signed)
Addended by: Shon Millet on: 11/22/2021 03:46 PM   Modules accepted: Orders

## 2021-11-24 LAB — URINE CULTURE
MICRO NUMBER:: 13476554
SPECIMEN QUALITY:: ADEQUATE

## 2021-11-25 NOTE — Progress Notes (Signed)
Urine culture was negative. Seen was normal bacteria expected around area

## 2021-12-11 ENCOUNTER — Other Ambulatory Visit: Payer: Self-pay | Admitting: Family

## 2021-12-11 DIAGNOSIS — E039 Hypothyroidism, unspecified: Secondary | ICD-10-CM

## 2021-12-13 ENCOUNTER — Other Ambulatory Visit (INDEPENDENT_AMBULATORY_CARE_PROVIDER_SITE_OTHER): Payer: 59

## 2021-12-13 ENCOUNTER — Telehealth: Payer: Self-pay | Admitting: Family

## 2021-12-13 ENCOUNTER — Other Ambulatory Visit: Payer: Self-pay | Admitting: Family

## 2021-12-13 DIAGNOSIS — E039 Hypothyroidism, unspecified: Secondary | ICD-10-CM

## 2021-12-13 LAB — TSH: TSH: 10.86 u[IU]/mL — ABNORMAL HIGH (ref 0.35–5.50)

## 2021-12-13 MED ORDER — LEVOTHYROXINE SODIUM 112 MCG PO TABS: 112.0000 ug | ORAL_TABLET | Freq: Every day | ORAL | 0 refills | Status: DC

## 2021-12-16 ENCOUNTER — Other Ambulatory Visit: Payer: Self-pay | Admitting: Family

## 2021-12-16 ENCOUNTER — Other Ambulatory Visit: Payer: Self-pay

## 2021-12-16 DIAGNOSIS — E039 Hypothyroidism, unspecified: Secondary | ICD-10-CM

## 2021-12-16 MED ORDER — LEVOTHYROXINE SODIUM 112 MCG PO TABS: 112.0000 ug | ORAL_TABLET | Freq: Every day | ORAL | 0 refills | Status: DC

## 2021-12-29 ENCOUNTER — Emergency Department (HOSPITAL_BASED_OUTPATIENT_CLINIC_OR_DEPARTMENT_OTHER)
Admission: EM | Admit: 2021-12-29 | Discharge: 2021-12-29 | Disposition: A | Discharge status: Home / Self Care | Payer: 59 | Attending: Emergency Medicine | Admitting: Emergency Medicine

## 2021-12-29 ENCOUNTER — Other Ambulatory Visit: Payer: Self-pay

## 2021-12-29 ENCOUNTER — Encounter (HOSPITAL_BASED_OUTPATIENT_CLINIC_OR_DEPARTMENT_OTHER): Payer: Self-pay | Admitting: Emergency Medicine

## 2021-12-29 ENCOUNTER — Emergency Department (HOSPITAL_BASED_OUTPATIENT_CLINIC_OR_DEPARTMENT_OTHER): Payer: 59 | Admitting: Radiology

## 2021-12-29 DIAGNOSIS — J40 Bronchitis, not specified as acute or chronic: Secondary | ICD-10-CM | POA: Diagnosis not present

## 2021-12-29 DIAGNOSIS — Z20822 Contact with and (suspected) exposure to covid-19: Secondary | ICD-10-CM | POA: Diagnosis not present

## 2021-12-29 DIAGNOSIS — R059 Cough, unspecified: Secondary | ICD-10-CM | POA: Diagnosis present

## 2021-12-29 HISTORY — DX: Gastro-esophageal reflux disease without esophagitis: K21.9

## 2021-12-29 LAB — SARS CORONAVIRUS 2 BY RT PCR: SARS Coronavirus 2 by RT PCR: NEGATIVE

## 2021-12-29 MED ORDER — PREDNISONE 10 MG (21) PO TBPK
ORAL_TABLET | Freq: Every day | ORAL | 0 refills | Status: DC
Start: 2021-12-29 — End: 2022-01-27

## 2021-12-29 MED ORDER — PROMETHAZINE-DM 6.25-15 MG/5ML PO SYRP: 5.0000 mL | ORAL_SOLUTION | Freq: Four times a day (QID) | ORAL | 0 refills | Status: DC | PRN

## 2021-12-29 MED ORDER — AEROCHAMBER PLUS FLO-VU MISC
1.0000 | Freq: Once | Status: AC
  Administered 2021-12-29: 1
  Filled 2021-12-29: qty 1

## 2021-12-29 MED ORDER — ALBUTEROL SULFATE HFA 108 (90 BASE) MCG/ACT IN AERS
2.0000 | INHALATION_SPRAY | Freq: Once | RESPIRATORY_TRACT | Status: AC
  Administered 2021-12-29: 2 via RESPIRATORY_TRACT
  Filled 2021-12-29: qty 6.7

## 2021-12-29 NOTE — ED Triage Notes (Signed)
Tested negative for covid at home

## 2021-12-29 NOTE — ED Provider Notes (Signed)
[facility] EMERGENCY DEPT Provider Note   CSN: [id number] Arrival date & time: 12/29/21  5329     History  Chief Complaint  Patient presents with   Cough    Lauren Beck is a 61 y.o. female.   Cough  Patient is a 61 year old female of no pertinent past medical history  She is presented emergency room today with complaints of cough congestion fatigue for the past 3 weeks.  She states she has some sinus pressure as well.  She denies any fevers.  She states her cough is dry.  She states that her symptoms have not gotten worse but rather simply persisted.  She has tried some over-the-counter cough medicines without relief.  Denies chest pain or difficulty breathing.  No hemoptysis     Home Medications Prior to Admission medications   Medication Sig Start Date End Date Taking? Authorizing Provider  predniSONE (STERAPRED UNI-PAK 21 TAB) 10 MG (21) TBPK tablet Take by mouth daily. Take 6 tabs by mouth daily  for 2 days, then 5 tabs for 2 days, then 4 tabs for 2 days, then 3 tabs for 2 days, 2 tabs for 2 days, then 1 tab by mouth daily for 2 days 12/29/21  Yes Marquetta Weiskopf S, PA  promethazine-dextromethorphan (PROMETHAZINE-DM) 6.25-15 MG/5ML syrup Take 5 mLs by mouth 4 (four) times daily as needed for cough. 12/29/21  Yes Meranda Dechaine, Stevphen Meuse S, PA  acetaminophen (TYLENOL) 500 MG tablet Take 500 mg by mouth every 6 (six) hours as needed.    [provider]  atorvastatin (LIPITOR) 20 MG tablet Take 1 tablet (20 mg total) by mouth daily. 11/19/21   [name], FNP  bismuth subsalicylate (PEPTO BISMOL) 262 MG/15ML suspension Take 15 mLs by mouth every 6 (six) hours as needed for indigestion or diarrhea or loose stools.    [provider]  levothyroxine (SYNTHROID) 112 MCG tablet Take 1 tablet (112 mcg total) by mouth daily. 12/16/21   [name], FNP  Multiple Vitamin (MULTIVITAMIN WITH MINERALS) TABS tablet Take 1 tablet by mouth daily.    [provider]  pantoprazole (PROTONIX) 40 MG tablet Take 1 tablet (40 mg total) by mouth daily. 11/14/21   [name], FNP      Allergies    Benadryl [diphenhydramine hcl]    Review of Systems   Review of Systems  Respiratory:  Positive for cough.     Physical Exam Updated Vital Signs BP 104/71   Pulse 80   Temp 97.6 F (36.4 C) (Oral)   Resp 18   LMP 04/06/2012   SpO2 96%  Physical Exam Vitals and nursing note reviewed.  Constitutional:      General: She is not in acute distress. HENT:     Head: Normocephalic and atraumatic.     Nose: Nose normal.  Eyes:     General: No scleral icterus. Cardiovascular:     Rate and Rhythm: Normal rate and regular rhythm.     Pulses: Normal pulses.     Heart sounds: Normal heart sounds.  Pulmonary:     Effort: Pulmonary effort is normal. No respiratory distress.     Breath sounds: No wheezing.     Comments: Coughs occasionally, speaks in full sentences, lungs clear to auscultation no wheezing Abdominal:     Palpations: Abdomen is soft.     Tenderness: There is no abdominal tenderness. There is no guarding or rebound.  Musculoskeletal:     Cervical back: Normal range of motion.  Right lower leg: No edema.     Left lower leg: No edema.  Skin:    General: Skin is warm and dry.     Capillary Refill: Capillary refill takes less than 2 seconds.  Neurological:     Mental Status: She is alert. Mental status is at baseline.  Psychiatric:        Mood and Affect: Mood normal.        Behavior: Behavior normal.     ED Results / Procedures / Treatments   Labs (all labs ordered are listed, but only abnormal results are displayed) Labs Reviewed  SARS CORONAVIRUS 2 BY RT PCR    EKG None  Radiology DG Chest 2 View  Result Date: 12/29/2021 CLINICAL DATA:  Cough beginning 3 weeks ago. EXAM: CHEST - 2 VIEW COMPARISON:  09/25/2019. FINDINGS: Normal heart, mediastinum and hila. Stable scarring at the apices.  Remainder of the lungs  is clear. No pleural effusion or pneumothorax. Skeletal structures are intact. IMPRESSION: No active cardiopulmonary disease. Electronically Signed   By: Amie Portland M.D.   On: 12/29/2021 12:57    Procedures Procedures    Medications Ordered in ED Medications  albuterol (VENTOLIN HFA) 108 (90 Base) MCG/ACT inhaler 2 puff (2 puffs Inhalation Given 12/29/21 1239)  aerochamber plus with mask device 1 each (1 each Other Given 12/29/21 1239)    ED Course/ Medical Decision Making/ A&P                           Medical Decision Making Amount and/or Complexity of Data Reviewed Radiology: ordered.  Risk Prescription drug management.    Patient is a 61 year old female of no pertinent past medical history  She is presented emergency room today with complaints of cough congestion fatigue for the past 3 weeks.  She states she has some sinus pressure as well.  She denies any fevers.  She states her cough is dry.  She states that her symptoms have not gotten worse but rather simply persisted.  She has tried some over-the-counter cough medicines without relief.  Denies chest pain or difficulty breathing.  No hemoptysis  PCR COVID test negative.  I personally viewed images of chest x-ray which is without any acute abnormality. Specifically no pneumothorax or pneumonia.  We will provide patient with antitussive Promethazine DM and recommend warm tea and honey, also prednisone given her prolonged symptoms.  Given an albuterol inhaler for home use as needed.  We will discharge him at this time.  Final Clinical Impression(s) / ED Diagnoses Final diagnoses:  Bronchitis    Rx / DC Orders ED Discharge Orders          Ordered    promethazine-dextromethorphan (PROMETHAZINE-DM) 6.25-15 MG/5ML syrup  4 times daily PRN        12/29/21 1303    predniSONE (STERAPRED UNI-PAK 21 TAB) 10 MG (21) TBPK tablet  Daily        12/29/21 1303              Gailen Shelter, [place] 12/29/21 1554    Sloan Leiter, DO 12/30/21 [phone]

## 2021-12-29 NOTE — ED Notes (Signed)
Discharge instructions, follow up care, and prescriptions reviewed and explained, pt verbalized understanding. Pt caox4 and ambulatory on departure.  

## 2021-12-29 NOTE — ED Notes (Signed)
Coffee provided

## 2021-12-29 NOTE — Discharge Instructions (Signed)
Bronchitis is caused by viruses.  This may take another week or 2 to resolve unfortunately.  I have prescribed a cough medicine to--as we discussed warm tea and honey works as well or better than any cough medicine we have come up with--but is certainly worth trying.  May use the albuterol inhaler as needed and starting the prednisone today.

## 2021-12-29 NOTE — ED Triage Notes (Signed)
Pt started with 3 weeks ago, sinus trouble, progressed to cough and now her right ear is hurting/clogged up . Denies short of breath, or fever.

## 2022-01-13 ENCOUNTER — Ambulatory Visit (HOSPITAL_COMMUNITY)
Admission: RE | Admit: 2022-01-13 | Discharge: 2022-01-13 | Disposition: A | Discharge status: Home / Self Care | Payer: 59 | Source: Ambulatory Visit | Attending: Family | Admitting: Family

## 2022-01-13 DIAGNOSIS — K219 Gastro-esophageal reflux disease without esophagitis: Secondary | ICD-10-CM | POA: Diagnosis present

## 2022-01-13 DIAGNOSIS — R1010 Upper abdominal pain, unspecified: Secondary | ICD-10-CM | POA: Diagnosis present

## 2022-01-13 NOTE — Progress Notes (Signed)
Negative abdominal US

## 2022-01-23 ENCOUNTER — Ambulatory Visit (HOSPITAL_COMMUNITY): Payer: Managed Care, Other (non HMO)

## 2022-01-23 ENCOUNTER — Encounter (HOSPITAL_COMMUNITY): Payer: Self-pay

## 2022-01-23 ENCOUNTER — Telehealth: Payer: Self-pay | Admitting: Family

## 2022-01-23 ENCOUNTER — Ambulatory Visit (HOSPITAL_COMMUNITY)
Admission: RE | Admit: 2022-01-23 | Discharge: 2022-01-23 | Disposition: A | Discharge status: Home / Self Care | Payer: Managed Care, Other (non HMO) | Source: Ambulatory Visit | Attending: Internal Medicine | Admitting: Internal Medicine

## 2022-01-23 VITALS — BP 109/69 | HR 102 | Temp 99.6°F | Resp 18

## 2022-01-23 DIAGNOSIS — I951 Orthostatic hypotension: Secondary | ICD-10-CM

## 2022-01-23 DIAGNOSIS — R0789 Other chest pain: Secondary | ICD-10-CM | POA: Diagnosis not present

## 2022-01-23 DIAGNOSIS — J209 Acute bronchitis, unspecified: Secondary | ICD-10-CM

## 2022-01-23 DIAGNOSIS — R059 Cough, unspecified: Secondary | ICD-10-CM | POA: Diagnosis not present

## 2022-01-23 DIAGNOSIS — M940 Chondrocostal junction syndrome [Tietze]: Secondary | ICD-10-CM

## 2022-01-23 LAB — POCT URINALYSIS DIPSTICK, ED / UC
Bilirubin Urine: NEGATIVE
Glucose, UA: NEGATIVE mg/dL
Ketones, ur: NEGATIVE mg/dL
Leukocytes,Ua: NEGATIVE
Nitrite: NEGATIVE
Protein, ur: NEGATIVE mg/dL
Specific Gravity, Urine: 1.02 (ref 1.005–1.030)
Urobilinogen, UA: 0.2 mg/dL (ref 0.0–1.0)
pH: 6 (ref 5.0–8.0)

## 2022-01-23 MED ORDER — DOXYCYCLINE HYCLATE 100 MG PO CAPS: 100.0000 mg | ORAL_CAPSULE | Freq: Two times a day (BID) | ORAL | 0 refills | Status: DC

## 2022-01-23 MED ORDER — NAPROXEN 500 MG PO TABS: 500.0000 mg | ORAL_TABLET | Freq: Two times a day (BID) | ORAL | 0 refills | Status: DC

## 2022-01-23 NOTE — Telephone Encounter (Signed)
I spoke with Florentina Addison (DPR signed) Mr Top said he is scheduled for surgery tomorrow morning early and pt does not want to go to ED and wait several hours to be seen. Tim said that pt is not dizzy now and is not having mid chest pain now. Pt last had symptoms this morning.Mr Vanderkolk said since no available appts today at [facility] pt will go to [facility] on [address] in [place]. I scheduled next available appt for [facility] [address] [place] 01/23/22 at 4:30 with UC & ED precautions and pts husband voiced understanding and appreciative of appt. Sending note to T Dugal FNP and Tresa Endo CMA I did double ck on pts appt notes and the appt for [facility] [place] is not showing up. I called [facility] [phone] and spoke with [place] at [facility] and [place] said they do have Melanie Crazier scheduled for appt at their facility today at 4:30.

## 2022-01-23 NOTE — Discharge Instructions (Addendum)
Follow up with your primary care doctor in 3-7 days Push at least 100 oz of fluids daily, you are not drinking enough.

## 2022-01-23 NOTE — ED Triage Notes (Signed)
Pt reports yesterday started having a cough and chest pains. Chest pains hurt with ambulation as well. Reports today when went to kitchen felt dizzy and nauseated so went back to bed.

## 2022-01-23 NOTE — ED Provider Notes (Addendum)
[facility]    CSN: [id number] Arrival date & time: 01/23/22  1608      History   Chief Complaint Chief Complaint  Patient presents with   appt 430p    HPI Lauren Beck is a 61 y.o. female who presents with onset of cough x 3 weeks  and new CP since yesterday. Her cough is productive with yellow mucous. CP is provoked with walking, touch and deep breaths. Today felt dizzy and nauseous when she got up to go to the kitchen and went back to bed. She described the dizziness and something is pulling her down and is going to faint. She gets hot and sweaty and has to lay down and sleep it off, then gets up and is fine. She did the same thing one month ago. She was in the ER 3 weeks ago for viral bronchitis and her cough never resolved since. Her cough is productive and non productive off and on. Has had 2 negative covid tests one at home and one in the ER.     Past Medical History:  Diagnosis Date   Allergic rhinitis    COPD (chronic obstructive pulmonary disease) (HCC)    Excessive or frequent menstruation    GERD (gastroesophageal reflux disease)    History of migraines    Hypothyroidism    Nonspecific abnormal electrocardiogram (ECG) (EKG)    Pure hypercholesterolemia    Tobacco use disorder    Unspecified adjustment reaction     Patient Active Problem List   Diagnosis Date Noted   Postmenopausal 11/14/2021   Anemia 11/14/2021   Mixed hyperlipidemia 11/14/2021   Current smoker 11/14/2021   Acquired hypothyroidism 11/14/2021   Epigastric pain 11/14/2021   Gastroesophageal reflux disease 11/14/2021   Screening for malignant neoplasm of colon 11/14/2021   Cervicalgia 11/14/2021   Urinary urgency 11/14/2021   Upper abdominal pain 11/14/2021   Seasonal allergic rhinitis due to pollen 11/14/2021   Neck pain 06/10/2011   TOBACCO ABUSE 03/16/2007   COPD 03/16/2007    Past Surgical History:  Procedure Laterality Date   HAMMER TOE SURGERY     PELVIC LAPAROSCOPY      exploratory, thought might have a cyst, not seen   TUBAL LIGATION      OB History   No obstetric history on file.      Home Medications    Prior to Admission medications   Medication Sig Start Date End Date Taking? Authorizing Provider  doxycycline (VIBRAMYCIN) 100 MG capsule Take 1 capsule (100 mg total) by mouth 2 (two) times daily. 01/23/22  Yes Rodriguez-Southworth, Nettie Elm, PA-C  naproxen (NAPROSYN) 500 MG tablet Take 1 tablet (500 mg total) by mouth 2 (two) times daily. 01/23/22  Yes Rodriguez-Southworth, Nettie Elm, PA-C  acetaminophen (TYLENOL) 500 MG tablet Take 500 mg by mouth every 6 (six) hours as needed.    [provider]  atorvastatin (LIPITOR) 20 MG tablet Take 1 tablet (20 mg total) by mouth daily. 11/19/21   [name], FNP  bismuth subsalicylate (PEPTO BISMOL) 262 MG/15ML suspension Take 15 mLs by mouth every 6 (six) hours as needed for indigestion or diarrhea or loose stools.    [provider]  levothyroxine (SYNTHROID) 112 MCG tablet Take 1 tablet (112 mcg total) by mouth daily. 12/16/21   [name], FNP  Multiple Vitamin (MULTIVITAMIN WITH MINERALS) TABS tablet Take 1 tablet by mouth daily.    [provider]  pantoprazole (PROTONIX) 40 MG tablet Take 1 tablet (40 mg total)  by mouth daily. 11/14/21   [name], FNP  predniSONE (STERAPRED UNI-PAK 21 TAB) 10 MG (21) TBPK tablet Take by mouth daily. Take 6 tabs by mouth daily  for 2 days, then 5 tabs for 2 days, then 4 tabs for 2 days, then 3 tabs for 2 days, 2 tabs for 2 days, then 1 tab by mouth daily for 2 days 12/29/21   Gailen Shelter, PA  promethazine-dextromethorphan (PROMETHAZINE-DM) 6.25-15 MG/5ML syrup Take 5 mLs by mouth 4 (four) times daily as needed for cough. 12/29/21   Gailen Shelter, PA    Family History Family History  Problem Relation Age of Onset   Other Father        No contact--unknown    Social History Social History   Tobacco Use   Smoking status: Every  Day    Packs/day: 0.50    Years: 25.00    Total pack years: 12.50    Types: Cigarettes    Start date: 1980   Smokeless tobacco: Never  Vaping Use   Vaping Use: Some days   Substances: Nicotine  Substance Use Topics   Alcohol use: Not Currently    Alcohol/week: 1.0 standard drink of alcohol    Types: 1 Glasses of wine per week    Comment: wine   Drug use: No    Comment: tried pot once, didn't like it     Allergies   Benadryl [diphenhydramine hcl]   Review of Systems Review of Systems  Constitutional:  Positive for appetite change, chills and fatigue. Negative for fever.  HENT:  Negative for congestion, postnasal drip, rhinorrhea, sinus pressure and sore throat.   Eyes:  Negative for discharge.  Respiratory:  Positive for cough. Negative for chest tightness, shortness of breath and wheezing.        Has chest wall pain  Gastrointestinal:  Positive for nausea. Negative for vomiting.  Skin:  Negative for rash.  Neurological:  Positive for dizziness.     Physical Exam Triage Vital Signs ED Triage Vitals  Enc Vitals Group     BP 01/23/22 1629 109/69     Pulse Rate 01/23/22 1629 (!) 102     Resp 01/23/22 1629 18     Temp 01/23/22 1629 99.6 F (37.6 C)     Temp Source 01/23/22 1629 Oral     SpO2 01/23/22 1629 98 %     Weight --      Height --      Head Circumference --      Peak Flow --      Pain Score 01/23/22 1627 6     Pain Loc --      Pain Edu? --      Excl. in GC? --    Orthostatic VS for the past 24 hrs:  BP- Lying Pulse- Lying BP- Sitting Pulse- Sitting BP- Standing at 0 minutes Pulse- Standing at 0 minutes  01/23/22 1724 105/51 92 107/68 94 96/59 100    Updated Vital Signs BP 109/69 (BP Location: Left Arm)   Pulse (!) 102   Temp 99.6 F (37.6 C) (Oral)   Resp 18   LMP 04/06/2012   SpO2 98%   Visual Acuity Right Eye Distance:   Left Eye Distance:   Bilateral Distance:    Right Eye Near:   Left Eye Near:    Bilateral Near:       Physical  Exam Vitals signs and nursing note reviewed.  Constitutional:  General: She is not in acute distress.    Appearance: Normal appearance. She is not ill-appearing, toxic-appearing or diaphoretic.  HENT:     Head: Normocephalic.     Right Ear: Tympanic membrane dull and gray, but ear canal and external ear normal.     Left Ear: Tympanic membrane, ear canal and external ear normal.     Nose: Nose normal.     Mouth/Throat: clear    Mouth: Mucous membranes are moist.  Eyes:     General: No scleral icterus.       Right eye: No discharge.        Left eye: No discharge.     Conjunctiva/sclera: Conjunctivae normal.  Neck:     Musculoskeletal: Neck supple. No neck rigidity.  Cardiovascular:     Rate and Rhythm: Normal rate and regular rhythm.     Heart sounds: No murmur.  Pulmonary:     Effort: Pulmonary effort is normal. Central chest wall and R upper chest wall is tender and reproduced the pain she has been having.     Breath sounds: Normal breath sounds.  Musculoskeletal: Normal range of motion.  Lymphadenopathy:     Cervical: No cervical adenopathy.  Skin:    General: Skin is warm and dry.     Coloration: Skin is not jaundiced.     Findings: No rash.  Neurological:     Mental Status: She is alert and oriented to person, place, and time.     Gait: Gait normal.  Psychiatric:        Mood and Affect: Mood normal.        Behavior: Behavior normal.        Thought Content: Thought content normal.        Judgment: Judgment normal.     UC Treatments / Results  Labs (all labs ordered are listed, but only abnormal results are displayed) Labs Reviewed  POCT URINALYSIS DIPSTICK, ED / UC - Abnormal; Notable for the following components:      Result Value   Hgb urine dipstick SMALL (*)    All other components within normal limits  SG 1.020  EKG   Radiology DG Chest 2 View  Result Date: 01/23/2022 CLINICAL DATA:  Cough and anterior chest wall pain for 24 hours. EXAM: CHEST - 2  VIEW COMPARISON:  Chest two views 12/29/2020 FINDINGS: Cardiac silhouette and mediastinal contours are within normal limits. There is again flattening of the diaphragms and mild hyperinflation. Increased lucencies within the upper lungs are again seen suggesting chronic emphysematous change. Mild biapical chronic pleural scarring is unchanged. Mild left costophrenic angle linear scarring is unchanged. No new focal airspace opacity. No pleural effusion or pneumothorax. Mild multilevel degenerative disc changes of the thoracic spine. IMPRESSION: Chronic hyperinflation with mild chronic left costophrenic angle scarring. No acute lung process. Electronically Signed   By: Neita Garnet M.D.   On: 01/23/2022 17:27    Procedures Procedures (including critical care time)  Medications Ordered in UC Medications - No data to display  Initial Impression / Assessment and Plan / UC Course  I have reviewed the triage vital signs and the nursing notes. Acute bronchitis Costochondritis Orthostatic hypotension  I placed her on Doxy and Naprosyn as noted. See instructions.      Final Clinical Impressions(s) / UC Diagnoses   Final diagnoses:  Orthostatic hypotension  Acute bronchitis, unspecified organism  Costochondritis, acute     Discharge Instructions      Follow up with your  primary care doctor in 3-7 days Push at least 100 oz of fluids daily, you are not drinking enough.      ED Prescriptions     Medication Sig Dispense Auth. Provider   doxycycline (VIBRAMYCIN) 100 MG capsule Take 1 capsule (100 mg total) by mouth 2 (two) times daily. 20 capsule Rodriguez-Southworth, Nettie Elm, PA-C   naproxen (NAPROSYN) 500 MG tablet Take 1 tablet (500 mg total) by mouth 2 (two) times daily. 14 tablet Rodriguez-Southworth, Nettie Elm, PA-C      PDMP not reviewed this encounter.   Garey Ham, PA-C 01/23/22 1655    Rodriguez-Southworth, [place], PA-C 01/23/22 1813     Rodriguez-Southworth, [place], PA-C 01/23/22 1814

## 2022-01-23 NOTE — Telephone Encounter (Signed)
Patients husband called and said that patient has been having dizzy spells and chest pain between her breast for the past few days, he asked if Wyatt Mage could work her in today at all. I did send to triage to further evaluate because of symptoms as well

## 2022-01-23 NOTE — Telephone Encounter (Signed)
Agree with precautions given to pt  Agree with nurse assessment in plan.  Thank you for speaking with them. 

## 2022-01-23 NOTE — Telephone Encounter (Signed)
[facility] Day - Client TELEPHONE ADVICE RECORD [facility] Patient Name: Lauren Beck LE Gender: Female DOB: 1960/07/03 Age: 61 Y 4 M 20 D Return Phone Number: [phone] (Primary) Address: City/ State/ Zip: [place]  [address] Client [facility] Day - Client Client Site [facility] - Day Provider AA - PHYSICIAN, NOT LISTED- MD Contact Type Call Who Is Calling Patient / Member / Family / Caregiver Call Type Triage / Clinical Caller Name Graciella Arment Relationship To Patient Spouse Return Phone Number [phone] (Primary) Chief Complaint CHEST PAIN - pain, pressure, heaviness or tightness Reason for Call Symptomatic / Request for Health Information Initial Comment Caller states his wife has been having dizzy spells and chest pain in the middle of her chest. She sees Dr. [name]. Provider not listed but office transferring the patient over for triage. Translation No Nurse Assessment Nurse: Stefano Gaul, RN, Vera Date/Time (Eastern Time): 01/23/2022 12:56:57 PM Confirm and document reason for call. If symptomatic, describe symptoms. ---Caller states spouse is having dizzy spells and she has chest pain in the middle of her chest. has had bronchitis with a cough. Does the patient have any new or worsening symptoms? ---Yes Will a triage be completed? ---Yes Related visit to physician within the last 2 weeks? ---Yes Does the PT have any chronic conditions? (i.e. diabetes, asthma, this includes High risk factors for pregnancy, etc.) ---Yes List chronic conditions. ---bronchitis; COPD Is this a behavioral health or substance abuse call? ---No Guidelines Guideline Title Affirmed Question Affirmed Notes Nurse Date/Time (Eastern Time) Cough - Acute Productive Chest pain (Exception: MILD central chest pain, present only when coughing.) Stefano Gaul, RN, Dwana Curd 01/23/2022 12:59:32 PM PLEASE NOTE: All  timestamps contained within this report are represented as [place] Standard Time. CONFIDENTIALTY NOTICE: This fax transmission is intended only for the addressee. It contains information that is legally privileged, confidential or otherwise protected from use or disclosure. If you are not the intended recipient, you are strictly prohibited from reviewing, disclosing, copying using or disseminating any of this information or taking any action in reliance on or regarding this information. If you have received this fax in error, please notify us immediately by telephone so that we can arrange for its return to [place]. Phone: [phone], Toll-Free: [phone], Fax: [phone] Page: 2 of 2 Call Id: [id number] Disp. Time Lamount Cohen Time) Disposition Final User 01/23/2022 12:53:16 PM Send to Urgent Geanie Logan 01/23/2022 1:02:38 PM Go to ED Now Yes Stefano Gaul, RN, Dwana Curd Final Disposition 01/23/2022 1:02:38 PM Go to ED Now Yes Stefano Gaul, RN, Clerance Lav Disagree/Comply Disagree Caller Understands Yes PreDisposition Call Doctor Care Advice Given Per Guideline GO TO ED NOW: * You need to be seen in the Emergency Department. ANOTHER ADULT SHOULD DRIVE: * It is better and safer if another adult drives instead of you. CARE ADVICE given per Cough - Acute Productive (Adult) guideline. CALL EMS 911 IF: * Severe difficulty breathing occurs Comments User: Art Buff, RN Date/Time Lamount Cohen Time): 01/23/2022 1:02:24 PM pt states spouse has surgery at 6:30 am tomorrow and does not want to go to the ER but wants to see doctor instead User: Art Buff, RN Date/Time Lamount Cohen Time): 01/23/2022 1:10:57 PM Called office and gave report that pt is dizzy and having chest pain and having a productive cough. Triage outcome go to ER now but pt does not want to go as spouse has surgery at 6:30 am tomorrow Referrals GO TO FACILITY REFUSE

## 2022-01-27 ENCOUNTER — Encounter: Payer: Self-pay | Admitting: Gastroenterology

## 2022-01-27 ENCOUNTER — Ambulatory Visit: Payer: Managed Care, Other (non HMO) | Admitting: Gastroenterology

## 2022-01-27 VITALS — BP 100/60 | HR 84 | Ht 61.0 in | Wt 134.0 lb

## 2022-01-27 DIAGNOSIS — R1013 Epigastric pain: Secondary | ICD-10-CM

## 2022-01-27 DIAGNOSIS — K5909 Other constipation: Secondary | ICD-10-CM | POA: Diagnosis not present

## 2022-01-27 DIAGNOSIS — Z1211 Encounter for screening for malignant neoplasm of colon: Secondary | ICD-10-CM | POA: Diagnosis not present

## 2022-01-27 DIAGNOSIS — R12 Heartburn: Secondary | ICD-10-CM | POA: Diagnosis not present

## 2022-01-27 MED ORDER — PEG 3350-KCL-NA BICARB-NACL 420 G PO SOLR: 4000.0000 mL | Freq: Once | ORAL | 0 refills | Status: AC

## 2022-01-27 NOTE — Patient Instructions (Signed)
_______________________________________________________  If you are age 60 or older, your body mass index should be between 23-30. Your Body mass index is 25.32 kg/m. If this is out of the aforementioned range listed, please consider follow up with your Primary Care Provider.  If you are age 46 or younger, your body mass index should be between 19-25. Your Body mass index is 25.32 kg/m. If this is out of the aformentioned range listed, please consider follow up with your Primary Care Provider.   ________________________________________________________  The [facility] providers would like to encourage you to use [facility] to communicate with providers for non-urgent requests or questions.  Due to long hold times on the telephone, sending your provider a message by [facility] may be a faster and more efficient way to get a response.  Please allow 48 business hours for a response.  Please remember that this is for non-urgent requests.  _______________________________________________________   Lauren Beck have been scheduled for an endoscopy and colonoscopy. Please follow the written instructions given to you at your visit today. Please pick up your prep supplies at the pharmacy within the next 1-3 days. If you use inhalers (even only as needed), please bring them with you on the day of your procedure.   Due to recent changes in healthcare laws, you may see the results of your imaging and laboratory studies on MyChart before your provider has had a chance to review them.  We understand that in some cases there may be results that are confusing or concerning to you. Not all laboratory results come back in the same time frame and the provider may be waiting for multiple results in order to interpret others.  Please give [place] 48 hours in order for your provider to thoroughly review all the results before contacting the office for clarification of your results.    It was a pleasure to see you today!  Thank you for  trusting me with your gastrointestinal care!

## 2022-01-27 NOTE — Progress Notes (Signed)
[facility] Consult Note:  History: Lauren Beck 01/27/2022  Referring provider: [name], FNP  Reason for consult/chief complaint: Abdominal Pain (Epigastric pain, to discuss having a ECL)   Subjective  HPI: Lauren Beck was referred by her new PCP for heartburn and upper abdominal pain.  She gives a vague history of having been in an ED at some point for the symptoms and told she may have an ulcer.  She is a smoker and also recently diagnosed with bronchitis during July 9 emergency department visit, and was given a prednisone taper..  She was back in the ED 4 days ago for ongoing productive cough with costochondritis related to that, treated with doxycycline and Naprosyn. She reports that her cough is much improved in the last few days.  Her cough related chest pain is slightly better.   Lauren Beck describes 5 to 6 years of frequent heartburn and/or a burning upper abdominal discomfort that she feels convinced must be ulcers.  It was not improving on OTC acid suppression and she bought at [facility], but is much improved on pantoprazole 40 mg daily prescribed by primary care a few months back.  She has had no previous colorectal cancer screening, denies rectal bleeding reports a difficult pattern of 2-3 BMs per week for many years.  She has not taken any particular stool softeners laxatives or other therapies to relieve that.  She denies dysphagia, odynophagia, nausea or vomiting.  Appetite generally good and weight stable. Denies use of aspirin, Goody or BC powder.  Most often takes Tylenol if needed for pain, less commonly ibuprofen ROS:  Review of Systems  Constitutional:  Negative for appetite change and unexpected weight change.  HENT:  Negative for mouth sores and voice change.   Eyes:  Negative for pain and redness.  Respiratory:  Positive for cough. Negative for shortness of breath.   Cardiovascular:  Negative for chest pain and palpitations.  Genitourinary:   Negative for dysuria and hematuria.  Musculoskeletal:  Negative for arthralgias and myalgias.  Skin:  Negative for pallor and rash.  Neurological:  Negative for weakness and headaches.  Hematological:  Negative for adenopathy.     Past Medical History: Past Medical History:  Diagnosis Date   Allergic rhinitis    Arthritis    Bronchitis    COPD (chronic obstructive pulmonary disease) (HCC)    Excessive or frequent menstruation    GERD (gastroesophageal reflux disease)    History of migraines    Hypothyroidism    Nonspecific abnormal electrocardiogram (ECG) (EKG)    Pure hypercholesterolemia    Tobacco use disorder    Unspecified adjustment reaction      Past Surgical History: Past Surgical History:  Procedure Laterality Date   HAMMER TOE SURGERY Bilateral    PELVIC LAPAROSCOPY     exploratory, thought might have a cyst, not seen   TUBAL LIGATION       Family History: Family History  Problem Relation Age of Onset   Other Father        No contact--unknown   Breast cancer Sister    Thyroid disease Daughter    Anxiety disorder Daughter    OCD Daughter     Social History: Social History   Socioeconomic History   Marital status: Divorced    Spouse name: Not on file   Number of children: 3   Years of education: Not on file   Highest education level: Not on file  Occupational History   Occupation: Company secretary  Tobacco Use   Smoking status: Every Day    Packs/day: 0.50    Years: 25.00    Total pack years: 12.50    Types: Cigarettes    Start date: 47   Smokeless tobacco: Never  Vaping Use   Vaping Use: Some days   Substances: Nicotine  Substance and Sexual Activity   Alcohol use: Yes    Alcohol/week: 1.0 standard drink of alcohol    Types: 1 Glasses of wine per week    Comment: occasional wine   Drug use: No    Comment: tried pot once, didn't like it   Sexual activity: Yes    Partners: Male    Birth control/protection: Surgical  Other Topics  Concern   Not on file  Social History Narrative   Married--much stress at home with marriage      3 children         Social Determinants of Health   Financial Resource Strain: Not on file  Food Insecurity: Not on file  Transportation Needs: Not on file  Physical Activity: Not on file  Stress: Not on file  Social Connections: Not on file    Allergies: Allergies  Allergen Reactions   Benadryl [Diphenhydramine Hcl] Other (See Comments)    Makes patient extremely drowsy      Outpatient Meds: Current Outpatient Medications  Medication Sig Dispense Refill   acetaminophen (TYLENOL) 500 MG tablet Take 500 mg by mouth every 6 (six) hours as needed.     atorvastatin (LIPITOR) 20 MG tablet Take 1 tablet (20 mg total) by mouth daily. 90 tablet 3   bismuth subsalicylate (PEPTO BISMOL) 262 MG/15ML suspension Take 15 mLs by mouth every 6 (six) hours as needed for indigestion or diarrhea or loose stools.     levothyroxine (SYNTHROID) 112 MCG tablet Take 1 tablet (112 mcg total) by mouth daily. 90 tablet 0   Multiple Vitamin (MULTIVITAMIN WITH MINERALS) TABS tablet Take 1 tablet by mouth daily.     naproxen (NAPROSYN) 500 MG tablet Take 1 tablet (500 mg total) by mouth 2 (two) times daily. 14 tablet 0   pantoprazole (PROTONIX) 40 MG tablet Take 1 tablet (40 mg total) by mouth daily. 30 tablet 3   polyethylene glycol-electrolytes (NULYTELY) 420 g solution Take 4,000 mLs by mouth once for 1 dose. 4000 mL 0   No current facility-administered medications for this visit.      ___________________________________________________________________ Objective   Exam:  BP 100/60 (BP Location: Left Arm, Patient Position: Sitting, Cuff Size: Normal)   Pulse 84   Ht 5\' 1"  (1.549 m) Comment: height measured without shoes  Wt 134 lb (60.8 kg)   LMP 04/06/2012   BMI 25.32 kg/m  Wt Readings from Last 3 Encounters:  01/27/22 134 lb (60.8 kg)  11/14/21 136 lb 8 oz (61.9 kg)  09/25/19 115 lb (52.2  kg)    General: Well-appearing, no muscle wasting, normal vocal quality Eyes: sclera anicteric, no redness ENT: oral mucosa moist without lesions, no cervical or supraclavicular lymphadenopathy, fair dentition CV: Regular without murmur, no JVD, no peripheral edema Resp: clear to auscultation bilaterally, normal RR and effort noted GI: soft, no tenderness, with active bowel sounds. No guarding or palpable organomegaly noted. Skin; warm and dry, no rash or jaundice noted Neuro: awake, alert and oriented x 3. Normal gross motor function and fluent speech  Labs:     Latest Ref Rng & Units 11/14/2021   11:20 AM 05/05/2014    8:50 AM 08/30/2012  7:23 AM  CBC  WBC 4.0 - 10.5 K/uL 7.1  10.0  7.4   Hemoglobin 12.0 - 15.0 g/dL 40.0  86.7  61.9   Hematocrit 36.0 - 46.0 % 40.0  42.1  41.4   Platelets 150.0 - 400.0 K/uL 239.0  250  283       Latest Ref Rng & Units 11/14/2021   11:20 AM 05/05/2014    8:50 AM 08/30/2012    7:23 AM  CMP  Glucose 70 - 99 mg/dL 79  93  86   BUN 6 - 23 mg/dL 20  17  12    Creatinine 0.40 - 1.20 mg/dL  5.09  3.26   Sodium 135 - 145 mEq/L 141  138  139   Potassium 3.5 - 5.1 mEq/L 4.4  4.6  3.7   Chloride 96 - 112 mEq/L 104  101  102   CO2 19 - 32 mEq/L 30  24  27    Calcium 8.4 - 10.5 mg/dL 9.5  9.8  9.1   Total Protein 6.0 - 8.3 g/dL 6.9  7.4  7.5   Total Bilirubin 0.2 - 1.2 mg/dL 0.4  0.3  0.1   Alkaline Phos 39 - 117 U/L 93  87  115   AST 0 - 37 U/L 38  15  19   ALT 0 - 35 U/L 27  9  16     Negative urea breath test 11/14/2021 (uncertain if patient on acid suppression at that time)  Radiologic Studies:  CLINICAL DATA:  Cough and anterior chest wall pain for 24 hours.   EXAM: CHEST - 2 VIEW   COMPARISON:  Chest two views 12/29/2020   FINDINGS: Cardiac silhouette and mediastinal contours are within normal limits. There is again flattening of the diaphragms and mild hyperinflation. Increased lucencies within the upper lungs are again seen  suggesting chronic emphysematous change. Mild biapical chronic pleural scarring is unchanged. Mild left costophrenic angle linear scarring is unchanged. No new focal airspace opacity. No pleural effusion or pneumothorax. Mild multilevel degenerative disc changes of the thoracic spine.   IMPRESSION: Chronic hyperinflation with mild chronic left costophrenic angle scarring. No acute lung process.     Electronically Signed   By: M.D.   On: 01/23/2022 17:27 ___________________________________   CLINICAL DATA:  Chronic upper abdominal pain   EXAM: ABDOMEN ULTRASOUND COMPLETE   COMPARISON:  None Available.   FINDINGS: Gallbladder: No gallstones or wall thickening visualized. No sonographic Murphy sign noted by sonographer.   Common bile duct: Diameter: 4 mm   Liver: No focal lesion identified. Within normal limits in parenchymal echogenicity. Portal vein is patent on color Doppler imaging with normal direction of blood flow towards the liver.   IVC: No abnormality visualized.   Pancreas: Visualized portion unremarkable.   Spleen: Size and appearance within normal limits.   Right Kidney: Length: 8.8 cm. Echogenicity within normal limits. No mass or hydronephrosis visualized.   Left Kidney: Length: 9.9 cm. Echogenicity within normal limits. No mass or hydronephrosis visualized.   Abdominal aorta: No aneurysm visualized.   Other findings: None.   IMPRESSION: 1. No abnormalities are identified to explain the patient's symptoms.     Electronically Signed   By: 03/01/2021 III M.D.   On: 01/13/2022 14:12   Assessment: Encounter Diagnoses  Name Primary?   Heartburn Yes   Epigastric pain    Special screening for malignant neoplasms, colon    Chronic constipation     Heartburn  and epigastric pain consistent with reflux and perhaps gastritis.  Her UBT may have been inaccurate since she thinks she was probably taking OTC antacid at that time.   Thus, gastric biopsies may be warranted at the time of EGD. She unquestionably needs to stop smoking for multiple health reasons, including her upper digestive symptoms, though she seems disinclined to do so. Ultrasound negative for gallstones  Average risk for colorectal cancer, no prior screening.  Plan:  EGD Colonoscopy She was agreeable after discussion of procedure and risks.  The benefits and risks of the planned procedure were described in detail with the patient or (when appropriate) their health care proxy.  Risks were outlined as including, but not limited to, bleeding, infection, perforation, adverse medication reaction leading to cardiac or pulmonary decompensation, pancreatitis (if ERCP).  The limitation of incomplete mucosal visualization was also discussed.  No guarantees or warranties were given.  (GoLytely prep due to constipation, procedure is booked at least 2 weeks to fully recover from bronchitis)   Thank you for the courtesy of this consult.  Please call me with any questions or concerns.  Charlie Pitter III  CC: Referring provider noted above

## 2022-02-01 ENCOUNTER — Emergency Department (HOSPITAL_COMMUNITY): Payer: Managed Care, Other (non HMO)

## 2022-02-01 ENCOUNTER — Encounter (HOSPITAL_COMMUNITY): Payer: Self-pay | Admitting: Emergency Medicine

## 2022-02-01 ENCOUNTER — Emergency Department (HOSPITAL_COMMUNITY)
Admission: EM | Admit: 2022-02-01 | Discharge: 2022-02-01 | Disposition: A | Discharge status: Home / Self Care | Payer: Managed Care, Other (non HMO) | Attending: Emergency Medicine | Admitting: Emergency Medicine

## 2022-02-01 ENCOUNTER — Other Ambulatory Visit: Payer: Self-pay

## 2022-02-01 ENCOUNTER — Ambulatory Visit (HOSPITAL_COMMUNITY)
Admission: EM | Admit: 2022-02-01 | Discharge: 2022-02-01 | Disposition: A | Discharge status: Home / Self Care | Payer: Managed Care, Other (non HMO) | Attending: Physician Assistant | Admitting: Physician Assistant

## 2022-02-01 ENCOUNTER — Encounter (HOSPITAL_COMMUNITY): Payer: Self-pay

## 2022-02-01 DIAGNOSIS — F17200 Nicotine dependence, unspecified, uncomplicated: Secondary | ICD-10-CM | POA: Diagnosis not present

## 2022-02-01 DIAGNOSIS — Z20822 Contact with and (suspected) exposure to covid-19: Secondary | ICD-10-CM | POA: Insufficient documentation

## 2022-02-01 DIAGNOSIS — R059 Cough, unspecified: Secondary | ICD-10-CM | POA: Diagnosis present

## 2022-02-01 DIAGNOSIS — R509 Fever, unspecified: Secondary | ICD-10-CM

## 2022-02-01 DIAGNOSIS — R9431 Abnormal electrocardiogram [ECG] [EKG]: Secondary | ICD-10-CM | POA: Diagnosis not present

## 2022-02-01 DIAGNOSIS — R079 Chest pain, unspecified: Secondary | ICD-10-CM

## 2022-02-01 DIAGNOSIS — I959 Hypotension, unspecified: Secondary | ICD-10-CM

## 2022-02-01 DIAGNOSIS — R053 Chronic cough: Secondary | ICD-10-CM | POA: Insufficient documentation

## 2022-02-01 LAB — CBC WITH DIFFERENTIAL/PLATELET
Abs Immature Granulocytes: 0.03 10*3/uL (ref 0.00–0.07)
Basophils Absolute: 0 10*3/uL (ref 0.0–0.1)
Basophils Relative: 0 %
Eosinophils Absolute: 0 10*3/uL (ref 0.0–0.5)
Eosinophils Relative: 0 %
HCT: 38.3 % (ref 36.0–46.0)
Hemoglobin: 12.8 g/dL (ref 12.0–15.0)
Immature Granulocytes: 0 %
Lymphocytes Relative: 14 %
Lymphs Abs: 1.5 10*3/uL (ref 0.7–4.0)
MCH: 32.8 pg (ref 26.0–34.0)
MCHC: 33.4 g/dL (ref 30.0–36.0)
MCV: 98.2 fL (ref 80.0–100.0)
Monocytes Absolute: 1 10*3/uL (ref 0.1–1.0)
Monocytes Relative: 9 %
Neutro Abs: 7.8 10*3/uL — ABNORMAL HIGH (ref 1.7–7.7)
Neutrophils Relative %: 77 %
Platelets: 305 10*3/uL (ref 150–400)
RBC: 3.9 MIL/uL (ref 3.87–5.11)
RDW: 11.9 % (ref 11.5–15.5)
WBC: 10.4 10*3/uL (ref 4.0–10.5)
nRBC: 0 % (ref 0.0–0.2)

## 2022-02-01 LAB — BASIC METABOLIC PANEL
Anion gap: 9 (ref 5–15)
BUN: 15 mg/dL (ref 6–20)
CO2: 23 mmol/L (ref 22–32)
Calcium: 9.3 mg/dL (ref 8.9–10.3)
Chloride: 104 mmol/L (ref 98–111)
Creatinine, Ser: 0.91 mg/dL (ref 0.44–1.00)
GFR, Estimated: >60 mL/min (ref >60)
Glucose, Bld: 132 mg/dL — ABNORMAL HIGH (ref 70–99)
Potassium: 4.1 mmol/L (ref 3.5–5.1)
Sodium: 136 mmol/L (ref 135–145)

## 2022-02-01 LAB — TROPONIN I (HIGH SENSITIVITY)
Troponin I (High Sensitivity): <2 ng/L (ref <18)
Troponin I (High Sensitivity): <2 ng/L (ref <18)

## 2022-02-01 LAB — RESP PANEL BY RT-PCR (FLU A&B, COVID) ARPGX2
Influenza A by PCR: NEGATIVE
Influenza B by PCR: NEGATIVE
SARS Coronavirus 2 by RT PCR: NEGATIVE

## 2022-02-01 LAB — BRAIN NATRIURETIC PEPTIDE: B Natriuretic Peptide: 43.1 pg/mL (ref 0.0–100.0)

## 2022-02-01 MED ORDER — ONDANSETRON 4 MG PO TBDP
ORAL_TABLET | ORAL | Status: AC
  Filled 2022-02-01: qty 1

## 2022-02-01 MED ORDER — BENZONATATE 100 MG PO CAPS: 100.0000 mg | ORAL_CAPSULE | Freq: Three times a day (TID) | ORAL | 0 refills | Status: DC

## 2022-02-01 MED ORDER — ONDANSETRON 4 MG PO TBDP
4.0000 mg | ORAL_TABLET | Freq: Once | ORAL | Status: AC
  Administered 2022-02-01: 4 mg via ORAL

## 2022-02-01 MED ORDER — ALBUTEROL SULFATE HFA 108 (90 BASE) MCG/ACT IN AERS: 2.0000 | INHALATION_SPRAY | RESPIRATORY_TRACT | 0 refills | Status: DC | PRN

## 2022-02-01 MED ORDER — LEVOFLOXACIN 500 MG PO TABS: 500.0000 mg | ORAL_TABLET | Freq: Every day | ORAL | 0 refills | Status: DC

## 2022-02-01 MED ORDER — BENZONATATE 100 MG PO CAPS
200.0000 mg | ORAL_CAPSULE | Freq: Once | ORAL | Status: AC
  Administered 2022-02-01: 200 mg via ORAL
  Filled 2022-02-01: qty 2

## 2022-02-01 MED ORDER — LEVOFLOXACIN 500 MG PO TABS
500.0000 mg | ORAL_TABLET | Freq: Once | ORAL | Status: AC
  Administered 2022-02-01: 500 mg via ORAL
  Filled 2022-02-01: qty 1

## 2022-02-01 NOTE — ED Provider Notes (Signed)
[facility]   MRN: [id number] DOB: 1960/08/20  Subjective:   Lauren Beck is a 61 y.o. female with history of tobacco abuse, COPD, mixed hyperlipidemia, hypothyroidism, presenting for chills, cough, fatigue, chest pain.  She is accompanied by her husband.  Patient states that she was intending to see her PCP this week, but her husband had surgery and they were unable to keep this appointment.  Patient states that she went out to breakfast with her husband this morning and tried to eat a fruit cup, but ended up dry heaving.  She had sudden onset of chills and fatigue.  She states that she was able to eat a sandwich from [employer] yesterday and did not have any issues at that point.  She also reports that she has had intermittent feelings of someone standing on her chest.  She does not have a personal cardiac history.  She has had a cough going on since early July.  She was [facility] in [facility] and treated for bronchitis with prednisone and promethazine.  She was then seen again at this urgent care on 01/23/2022, placed on doxycycline for bronchitis.  Her chest x-ray was negative.  She was also given Naprosyn to help with costochondritis symptoms.  She was told to push fluids as she was found to be orthostatic.  She is feeling worse today than she did at the last 2 visits. She does have headache. Still feeling nauseated. No abdominal pain. No diarrhea. No constipation. No blood in stool.   No current facility-administered medications for this encounter.  Current Outpatient Medications:    acetaminophen (TYLENOL) 500 MG tablet, Take 500 mg by mouth every 6 (six) hours as needed., Disp: , Rfl:    atorvastatin (LIPITOR) 20 MG tablet, Take 1 tablet (20 mg total) by mouth daily., Disp: 90 tablet, Rfl: 3   bismuth subsalicylate (PEPTO BISMOL) 262 MG/15ML suspension, Take 15 mLs by mouth every 6 (six) hours as needed for indigestion or diarrhea or loose  stools., Disp: , Rfl:    levothyroxine (SYNTHROID) 112 MCG tablet, Take 1 tablet (112 mcg total) by mouth daily., Disp: 90 tablet, Rfl: 0   Multiple Vitamin (MULTIVITAMIN WITH MINERALS) TABS tablet, Take 1 tablet by mouth daily., Disp: , Rfl:    naproxen (NAPROSYN) 500 MG tablet, Take 1 tablet (500 mg total) by mouth 2 (two) times daily., Disp: 14 tablet, Rfl: 0   pantoprazole (PROTONIX) 40 MG tablet, Take 1 tablet (40 mg total) by mouth daily., Disp: 30 tablet, Rfl: 3   Allergies  Allergen Reactions   Benadryl [Diphenhydramine Hcl] Other (See Comments)    Makes patient extremely drowsy      Past Medical History:  Diagnosis Date   Allergic rhinitis    Arthritis    Bronchitis    COPD (chronic obstructive pulmonary disease) (HCC)    Excessive or frequent menstruation    GERD (gastroesophageal reflux disease)    History of migraines    Hypothyroidism    Nonspecific abnormal electrocardiogram (ECG) (EKG)    Pure hypercholesterolemia    Tobacco use disorder    Unspecified adjustment reaction      Past Surgical History:  Procedure Laterality Date   HAMMER TOE SURGERY Bilateral    PELVIC LAPAROSCOPY     exploratory, thought might have a cyst, not seen   TUBAL LIGATION      Family History  Problem Relation Age of Onset   Other Father  No contact--unknown   Breast cancer Sister    Thyroid disease Daughter    Anxiety disorder Daughter    OCD Daughter     Social History   Tobacco Use   Smoking status: Every Day    Packs/day: 0.50    Years: 25.00    Total pack years: 12.50    Types: Cigarettes    Start date: 44   Smokeless tobacco: Never  Vaping Use   Vaping Use: Some days   Substances: Nicotine  Substance Use Topics   Alcohol use: Yes    Alcohol/week: 1.0 standard drink of alcohol    Types: 1 Glasses of wine per week    Comment: occasional wine   Drug use: No    Comment: tried pot once, didn't like it    ROS REFER TO HPI FOR PERTINENT POSITIVES AND  NEGATIVES   Objective:   Vitals: BP (!) 95/58 (BP Location: Right Arm)   Pulse 87   Temp (!) 101.4 F (38.6 C) (Oral)   Resp 20   LMP 04/06/2012   SpO2 100%   Physical Exam Vitals and nursing note reviewed.  Constitutional:      Appearance: Normal appearance. She is normal weight. She is ill-appearing. She is not toxic-appearing.     Comments: Lying on exam table wrapped in blanket shivering  HENT:     Head: Normocephalic and atraumatic.     Right Ear: Tympanic membrane, ear canal and external ear normal.     Left Ear: Tympanic membrane, ear canal and external ear normal.     Nose: Nose normal. No congestion.     Mouth/Throat:     Mouth: Mucous membranes are moist.  Eyes:     Extraocular Movements: Extraocular movements intact.     Conjunctiva/sclera: Conjunctivae normal.     Pupils: Pupils are equal, round, and reactive to light.  Cardiovascular:     Rate and Rhythm: Normal rate and regular rhythm.     Pulses: Normal pulses.     Heart sounds: Normal heart sounds. No murmur heard. Pulmonary:     Effort: Pulmonary effort is normal.     Breath sounds: Decreased breath sounds present. No wheezing.  Chest:     Comments: She has TTP over central chest  Abdominal:     General: Abdomen is flat. Bowel sounds are normal.     Palpations: Abdomen is soft.     Tenderness: There is no right CVA tenderness or left CVA tenderness.  Musculoskeletal:        General: Normal range of motion.     Cervical back: Normal range of motion and neck supple.     Right lower leg: No edema.     Left lower leg: No edema.  Skin:    General: Skin is warm and dry.  Neurological:     General: No focal deficit present.     Mental Status: She is alert and oriented to person, place, and time.     Cranial Nerves: No cranial nerve deficit.     Motor: No weakness.     Gait: Gait normal.  Psychiatric:        Mood and Affect: Mood normal.        Behavior: Behavior normal.        Thought Content:  Thought content normal.        Judgment: Judgment normal.     No results found for this or any previous visit (from the past 24 hour(s)).  Assessment  and Plan :   PDMP not reviewed this encounter.  1. Chest pain, unspecified type   2. Nonspecific abnormal electrocardiogram (ECG) (EKG)   3. Hypotension, unspecified hypotension type   4. Fever, unspecified    61 year old Caucasian female presents ill-appearing in the urgent care today.  With her complaints of chest pressure, an EKG was obtained.  I personally reviewed this EKG and she was found to be in normal sinus rhythm with rate of 89 bpm.  T wave inversions noted in V1 and V3.  Flattened T waves in V2.  No prior EKG to compare to.  She was also hypotensive and on recheck found to be febrile.  With her worsening progression of symptoms over the last week and a half, I did advise the patient to present to the emergency department at this time for a closer evaluation.  She may have COVID-19 or other viral illness exacerbating her symptoms, unfortunately we do not have rapid testing available at this site.  She may need IV fluids and labs checked.  She is stable for discharge to go by personal vehicle with her husband.  They would like to go to [facility] and agreed to head there now.    AllwardtCrist Infante, PA-C 02/01/22 1716

## 2022-02-01 NOTE — ED Notes (Signed)
Patient is being discharged from the Urgent Care and sent to the Emergency Department via POV . Per Allwardt, PA, patient is in need of higher level of care due to EKG changes, possible iv fluid . Patient is aware and verbalizes understanding of plan of care.  Vitals:   02/01/22 1546 02/01/22 1627  BP: 102/65 (!) 95/58  Pulse: (!) 104 87  Resp: 20 20  Temp: 99.3 F (37.4 C) (!) 101.4 F (38.6 C)  SpO2: 100%

## 2022-02-01 NOTE — Discharge Instructions (Signed)
I do feel that it is pertinent for you to follow-up immediately in the emergency department at this time.  Your EKG has some abnormalities noted and I do not have a prior EKG to compare to, with your chest pain as well this is concern for possible acute heart issues.  Your blood pressure is low and I do think you would benefit from IV fluids.  You also have a fever and I am not sure of the etiology at this time.  You need labs and further work-up at the emergency department to provide the best acute care for you at this time.

## 2022-02-01 NOTE — ED Provider Notes (Signed)
[facility]-EMERGENCY DEPT Provider Note   CSN: [id number] Arrival date & time: 02/01/22  1709     History  Chief Complaint  Patient presents with   Chest Pain    Lauren Beck is a 61 y.o. female.  HPI     61 year old female comes in with chief complaint of chest pain.  Patient accompanied by her husband.  Patient has history of tobacco use disorder, GERD.  She states that over the last few months she has had cough issues.  She was seen in the ER few months back.  She was started on steroids and improved.  But the cough returned, she went to urgent care and was given doxycycline and steroids, but the cough did not resolve.  She has a history of GERD, she is taking medications for it and the GERD is lot better.  Patient has started slowing down the smoking.  Cough is nonproductive.  Patient denies any pleuritic chest pain.  Pt has no hx of PE, DVT and denies any exogenous hormone (testosterone / estrogen) use, long distance travels or surgery in the past 6 weeks, active cancer, recent immobilization.  She had gone to the urgent care today because of a cough and feeling weak.  Over there she had a fever, her blood pressure was low and her EKG looked concerning for the staff, therefore they sent her to the emergency room.  Patient's chest pain is described as intermittent, no specific evoking, aggravating relieving factors.  She has had pain with deep inspiration sometimes and also with exertion and pressure on her chest.  She is also felt chest pain with food intake.   Home Medications Prior to Admission medications   Medication Sig Start Date End Date Taking? Authorizing Provider  albuterol (VENTOLIN HFA) 108 (90 Base) MCG/ACT inhaler Inhale 2 puffs into the lungs every 4 (four) hours as needed for wheezing or shortness of breath. 02/01/22  Yes Derwood Kaplan, MD  benzonatate (TESSALON) 100 MG capsule Take 1 capsule (100 mg total) by mouth every 8 (eight) hours.  02/01/22  Yes Derwood Kaplan, MD  levofloxacin (LEVAQUIN) 500 MG tablet Take 1 tablet (500 mg total) by mouth daily. 02/01/22  Yes Derwood Kaplan, MD  acetaminophen (TYLENOL) 500 MG tablet Take 500 mg by mouth every 6 (six) hours as needed.    [provider]  atorvastatin (LIPITOR) 20 MG tablet Take 1 tablet (20 mg total) by mouth daily. 11/19/21   [name], FNP  bismuth subsalicylate (PEPTO BISMOL) 262 MG/15ML suspension Take 15 mLs by mouth every 6 (six) hours as needed for indigestion or diarrhea or loose stools.    [provider]  levothyroxine (SYNTHROID) 112 MCG tablet Take 1 tablet (112 mcg total) by mouth daily. 12/16/21   [name], FNP  Multiple Vitamin (MULTIVITAMIN WITH MINERALS) TABS tablet Take 1 tablet by mouth daily.    [provider]  naproxen (NAPROSYN) 500 MG tablet Take 1 tablet (500 mg total) by mouth 2 (two) times daily. 01/23/22   Rodriguez-Southworth, Nettie Elm, PA-C  pantoprazole (PROTONIX) 40 MG tablet Take 1 tablet (40 mg total) by mouth daily. 11/14/21   [name], FNP      Allergies    Benadryl [diphenhydramine hcl]    Review of Systems   Review of Systems  All other systems reviewed and are negative.   Physical Exam Updated Vital Signs BP 127/75   Pulse 91   Temp 98.9 F (37.2 C) (Oral)   Resp Marland Kitchen)  21   Ht 5\' 1"  (1.549 m)   Wt 60.8 kg   LMP 04/06/2012   SpO2 98%   BMI 25.32 kg/m  Physical Exam Vitals and nursing note reviewed.  Constitutional:      Appearance: She is well-developed.  HENT:     Head: Atraumatic.  Cardiovascular:     Rate and Rhythm: Normal rate.     Heart sounds: Normal heart sounds.  Pulmonary:     Effort: Pulmonary effort is normal.     Breath sounds: Normal breath sounds.  Musculoskeletal:     Cervical back: Normal range of motion and neck supple.  Skin:    General: Skin is warm and dry.  Neurological:     Mental Status: She is alert and oriented to person, place, and time.      ED Results / Procedures / Treatments   Labs (all labs ordered are listed, but only abnormal results are displayed) Labs Reviewed  BASIC METABOLIC PANEL - Abnormal; Notable for the following components:      Result Value   Glucose, Bld 132 (*)    All other components within normal limits  CBC WITH DIFFERENTIAL/PLATELET - Abnormal; Notable for the following components:   Neutro Abs 7.8 (*)    All other components within normal limits  RESP PANEL BY RT-PCR (FLU A&B, COVID) [id number]  BRAIN NATRIURETIC PEPTIDE  TROPONIN I (HIGH SENSITIVITY)  TROPONIN I (HIGH SENSITIVITY)    EKG EKG Interpretation  Date/Time:  Saturday February 01 2022 17:18:21 EDT Ventricular Rate:  99 PR Interval:  117 QRS Duration: 95 QT Interval:  317 QTC Calculation: 407 R Axis:   57 Text Interpretation: Sinus rhythm Low voltage, precordial leads Abnormal R-wave progression, early transition Borderline T abnormalities, anterior leads No acute changes No significant change since last tracing Confirmed by [id number] [phone]) on 02/01/2022 8:13:22 PM  Radiology DG Chest 2 View  Result Date: 02/01/2022 CLINICAL DATA:  Chest pain EXAM: CHEST - 2 VIEW COMPARISON:  Radiographs 01/23/2022 FINDINGS: No focal consolidation, pleural effusion, or pneumothorax. Biapical scarring. Bibasilar scarring. Normal cardiomediastinal silhouette. No acute osseous abnormality. IMPRESSION: No active cardiopulmonary disease. Electronically Signed   By: 03/25/2022 M.D.   On: 02/01/2022 17:54    Procedures Procedures    Medications Ordered in ED Medications  benzonatate (TESSALON) capsule 200 mg (has no administration in time range)  levofloxacin (LEVAQUIN) tablet 500 mg (500 mg Oral Given 02/01/22 1957)    ED Course/ Medical Decision Making/ A&P                           Medical Decision Making Amount and/or Complexity of Data Reviewed Labs: ordered. Radiology: ordered.  Risk Prescription drug management.   This  patient presents to the ED with chief complaint(s) of chest pain, cough with pertinent past medical history of COPD which further complicates the presenting complaint.  Patient was found to have a fever at the urgent care today, and sent to the emergency room for further evaluation.  Her blood pressure was low at that time.  Cough itself has been waxing and waning in intensity for the last several weeks.  Patient has history of tobacco use disorder.  The complaint involves an extensive differential diagnosis and also carries with it a high risk of complications and morbidity.    The differential diagnosis includes : Community-acquired pneumonia, COPD exacerbation, PE, pulmonary tumor.  The initial plan is to get basic blood work  and x-ray of the chest. EKG is overall reassuring.  Chest pain is not consistent with ACS.  We will not be getting repeat troponin.  BNP is normal, which is reassuring.  Suspicion for PE is low.  Overall it appears that the cough is likely because of her worsening COPD.  GI etiology for chest pain less likely, given that she is already taking Protonix.   Additional history obtained: Additional history obtained from spouse Records reviewed  visit with GI, PCP and also urgent care and ER within the last 6 months  Independent labs interpretation:  The following labs were independently interpreted: Normal-appearing CBC, metabolic profile, troponin, BNP  Independent visualization of imaging: - I independently visualized the following imaging with scope of interpretation limited to determining acute life threatening conditions related to emergency care: X-ray of the chest, which revealed no mediastinal widening, no pneumothorax, no pneumonia.  Treatment and Reassessment: I had a discussion with the patient about her symptoms.  I indicated that chest pain is now chronic, likely will benefit with evaluation by specifically for chronic cough.  The PCP can decide if they want  further testing or pulmonology follow-up.  We discussed with her that we could consider getting CT angio chest here, given that she had a fever and some chest pain.  It would also evaluate for pneumonia.  Patient however had expressed to [place] that she would rather just go home, she is tired.  The alternative option given was that we start her on Levaquin and that she follows up with PCP as soon as possible, if not getting better so that they can consider getting CT angiogram if she is getting worse.  Patient will return to the emergency room if her symptoms start progressing.  She also would need a COVID test today.  Patient prefers the less aggressive option.  She will appreciate getting antibiotics now with some medications for cough and she will follow-up with her PCP to see if CT scan looking for PE or pulmonary doctor follow-up as needed.  We will give her inhaler prescription as well.   Final Clinical Impression(s) / ED Diagnoses Final diagnoses:  Chronic cough  Febrile illness    Rx / DC Orders ED Discharge Orders          Ordered    benzonatate (TESSALON) 100 MG capsule  Every 8 hours        02/01/22 2006    levofloxacin (LEVAQUIN) 500 MG tablet  Daily        02/01/22 2006    albuterol (VENTOLIN HFA) 108 (90 Base) MCG/ACT inhaler  Every 4 hours PRN        02/01/22 2006              Derwood Kaplan, MD 02/01/22 2020

## 2022-02-01 NOTE — ED Triage Notes (Signed)
Pt arrives with c/o chest pain that started about a week ago. Per pt, she has had bronchitis and treated for it. Pt is a smoker. Pt sent here from UC for workup.

## 2022-02-01 NOTE — ED Provider Triage Note (Signed)
Emergency Medicine Provider Triage Evaluation Note  Lauren Beck , a 61 y.o. female  was evaluated in triage.  Pt complains of chest pain and shortness of breath for about a week or 2.  Seen in urgent care, diagnosed with bronchitis.  Seen today, symptoms worsen they sent to ED.  Patient has no previous history of ACS, is coughing but no hemoptysis..  Nauseated but no vomiting.  Review of Systems  Per HPI Physical Exam  BP 114/67   Pulse (!) 101   Temp 98.9 F (37.2 C) (Oral)   Resp (!) 23   Ht 5\' 1"  (1.549 m)   Wt 60.8 kg   LMP 04/06/2012   SpO2 94%   BMI 25.32 kg/m  Gen:   Awake, no distress   Resp:  Decreased air movement MSK:   Moves extremities without difficulty  Other:  Tachycardic  Medical Decision Making  Medically screening exam initiated at 6:04 PM.  Appropriate orders placed.  Lauren Beck was informed that the remainder of the evaluation will be completed by another provider, this initial triage assessment does not replace that evaluation, and the importance of remaining in the ED until their evaluation is complete.     Melanie Crazier, PA-C 02/01/22 1805

## 2022-02-01 NOTE — ED Triage Notes (Signed)
Patient seen 01/23/2022.  Patient was intending to see pcp, but significant other had surgery and unable to keep appt.  This episode started yesterday of dizzy, dry heaves and tired.  Reports symptoms have returned.

## 2022-02-01 NOTE — ED Notes (Signed)
Family comes out of room to ask what they are waiting on, this nurse explained that there will be a doctor in soon to see her. Pt states " she seen a doctor up front and all of her stuff is back so what is the problem have yall forgot about her." Explained to patient that unfortunately the doctor that sees her in triage doesn't follow her once she comes in a room. Family member is upset and walks back to room.

## 2022-02-01 NOTE — Discharge Instructions (Addendum)
We saw in the emergency room for cough, fevers.  The work-up in the emergency room is reassuring.  The plan is to start you on Levaquin for suspected pneumonia given that you had fevers.  Additional medications have been prescribed for further symptom management.  You will follow-up with your primary care doctor in 5 to 7 days.  If you are not improving, then request additional evaluation for your chronic cough.

## 2022-02-03 ENCOUNTER — Ambulatory Visit: Payer: 59 | Admitting: Gastroenterology

## 2022-02-03 NOTE — ED Notes (Signed)
Pt called stating she can not get into her My Chart, requesting covid results. Neg Covid.

## 2022-02-04 ENCOUNTER — Telehealth: Payer: Self-pay | Admitting: Family

## 2022-02-04 ENCOUNTER — Ambulatory Visit: Payer: Managed Care, Other (non HMO) | Admitting: Family

## 2022-02-04 ENCOUNTER — Ambulatory Visit
Admission: RE | Admit: 2022-02-04 | Discharge: 2022-02-04 | Disposition: A | Discharge status: Home / Self Care | Payer: Managed Care, Other (non HMO) | Source: Ambulatory Visit | Attending: Family | Admitting: Family

## 2022-02-04 ENCOUNTER — Encounter: Payer: Self-pay | Admitting: Family

## 2022-02-04 VITALS — BP 106/58 | HR 74 | Temp 98.7°F | Resp 16 | Ht 61.0 in | Wt 129.0 lb

## 2022-02-04 DIAGNOSIS — R071 Chest pain on breathing: Secondary | ICD-10-CM

## 2022-02-04 DIAGNOSIS — J301 Allergic rhinitis due to pollen: Secondary | ICD-10-CM

## 2022-02-04 DIAGNOSIS — M5412 Radiculopathy, cervical region: Secondary | ICD-10-CM

## 2022-02-04 DIAGNOSIS — E039 Hypothyroidism, unspecified: Secondary | ICD-10-CM

## 2022-02-04 DIAGNOSIS — M4692 Unspecified inflammatory spondylopathy, cervical region: Secondary | ICD-10-CM

## 2022-02-04 DIAGNOSIS — J440 Chronic obstructive pulmonary disease with acute lower respiratory infection: Secondary | ICD-10-CM | POA: Diagnosis not present

## 2022-02-04 DIAGNOSIS — M792 Neuralgia and neuritis, unspecified: Secondary | ICD-10-CM | POA: Diagnosis not present

## 2022-02-04 DIAGNOSIS — R0789 Other chest pain: Secondary | ICD-10-CM | POA: Insufficient documentation

## 2022-02-04 DIAGNOSIS — K219 Gastro-esophageal reflux disease without esophagitis: Secondary | ICD-10-CM

## 2022-02-04 DIAGNOSIS — R739 Hyperglycemia, unspecified: Secondary | ICD-10-CM | POA: Insufficient documentation

## 2022-02-04 DIAGNOSIS — R0609 Other forms of dyspnea: Secondary | ICD-10-CM

## 2022-02-04 DIAGNOSIS — F17200 Nicotine dependence, unspecified, uncomplicated: Secondary | ICD-10-CM

## 2022-02-04 DIAGNOSIS — J209 Acute bronchitis, unspecified: Secondary | ICD-10-CM | POA: Insufficient documentation

## 2022-02-04 LAB — B12 AND FOLATE PANEL
Folate: 15.8 ng/mL (ref >5.9)
Vitamin B-12: 281 pg/mL (ref 211–911)

## 2022-02-04 LAB — HEMOGLOBIN A1C: Hgb A1c MFr Bld: 6.2 % (ref 4.6–6.5)

## 2022-02-04 MED ORDER — IOPAMIDOL (ISOVUE-370) INJECTION 76%
75.0000 mL | Freq: Once | INTRAVENOUS | Status: AC | PRN
  Administered 2022-02-04: 75 mL via INTRAVENOUS

## 2022-02-04 MED ORDER — UMECLIDINIUM-VILANTEROL 62.5-25 MCG/ACT IN AEPB: 1.0000 | INHALATION_SPRAY | Freq: Every day | RESPIRATORY_TRACT | 2 refills | Status: DC

## 2022-02-04 NOTE — Assessment & Plan Note (Signed)
Improving however still present. Will order CT angio.

## 2022-02-04 NOTE — Progress Notes (Signed)
Established Patient Office Visit  Subjective:  Patient ID: Lauren Beck, female    DOB: 1961-04-28  Age: 61 y.o. MRN: [id number]  CC:  Chief Complaint  Patient presents with   Follow-up   Shortness of Breath    HPI [name] is here today for follow up.   8/12 went to Ed with c/o chest pain after being seen at urgent care due to cough and weakness. She had a fever, low blood pressure and ekg was concerning to staff so sent to ER.   Covid test was negative.   EKG in ER was reassuring per ER. First troponin negative. BNP wnl. Suspicious for PE was low. Suspected to be worsening COPD   CXR with no acute findings.  ER suggested CT angio chest here, but she declined.  Discharged same day with tessalon perrles, levaquin as well as given albuterol inhaler.   Also, last tsh was 10.86 , taking levothyroxine 112 mcg  Overdue for repeat lab work  Lab Results  Component Value Date   TSH 10.86 (H) 12/13/2021    GERD, protonix 40 mg once daily which has been helpful. Has appt with GI 02/14/22. Is planning to get colonoscopy and EGD at that visit.   COPD: CXR 11/19/2005 wth flattening of hemidiaphragms consistent with COPD per radiologist.   mMRC dyspnea scale, asked in office, on a scale 0-4  0 I only get breathless with strenuous exercise  1 I get short of breath when hurrying on level ground or walking up a slight hill  2 on level ground, I walk slower than people of the same age because of breathlessness      or have to stop for breath when walking my own pace  3 I stop for breath after walking about 100 yards or after a few minutes on level ground  4 I am too breathless to lave the house or I am breathless when dressing   Have you had 2 or more moderate exacerbations or at least 1 hospitalization for COPD exacerbation in the past year? yes   Does the patient have high peripheral eosinophil levels (>300 ): No.   TODAY: pt states feeling better. When it is warm outside or  she feels hot, she has to cool down. No fever since ER visit. Urgent care said she had a fever, ER did not.   Smoker: not ready to quit. Was referred to lung cancer screening, states did not call back.  Does c/o left wrist pain and numbness son occasion. When she straigtens her arm she states numbness improves. She states she does see orthopedist and has pinched nerves in her neck.     Past Medical History:  Diagnosis Date   Allergic rhinitis    Arthritis    Bronchitis    COPD (chronic obstructive pulmonary disease) (HCC)    Excessive or frequent menstruation    GERD (gastroesophageal reflux disease)    History of migraines    Hypothyroidism    Nonspecific abnormal electrocardiogram (ECG) (EKG)    Pure hypercholesterolemia    Tobacco use disorder    Unspecified adjustment reaction     Past Surgical History:  Procedure Laterality Date   HAMMER TOE SURGERY Bilateral    PELVIC LAPAROSCOPY     exploratory, thought might have a cyst, not seen   TUBAL LIGATION      Family History  Problem Relation Age of Onset   Other Father        No contact--unknown  Breast cancer Sister    Thyroid disease Daughter    Anxiety disorder Daughter    OCD Daughter     Social History   Socioeconomic History   Marital status: Divorced    Spouse name: Not on file   Number of children: 3   Years of education: Not on file   Highest education level: Not on file  Occupational History   Occupation: Company secretary  Tobacco Use   Smoking status: Every Day    Packs/day: 0.50    Years: 25.00    Total pack years: 12.50    Types: Cigarettes    Start date: 1980   Smokeless tobacco: Never  Vaping Use   Vaping Use: Some days   Substances: Nicotine  Substance and Sexual Activity   Alcohol use: Yes    Alcohol/week: 1.0 standard drink of alcohol    Types: 1 Glasses of wine per week    Comment: occasional wine   Drug use: No    Comment: tried pot once, didn't like it   Sexual activity: Yes     Partners: Male    Birth control/protection: Surgical  Other Topics Concern   Not on file  Social History Narrative   Married--much stress at home with marriage      3 children         Social Determinants of Health   Financial Resource Strain: Not on file  Food Insecurity: Not on file  Transportation Needs: Not on file  Physical Activity: Not on file  Stress: Not on file  Social Connections: Not on file  Intimate Partner Violence: Not on file    Outpatient Medications Prior to Visit  Medication Sig Dispense Refill   acetaminophen (TYLENOL) 500 MG tablet Take 500 mg by mouth every 6 (six) hours as needed.     albuterol (VENTOLIN HFA) 108 (90 Base) MCG/ACT inhaler Inhale 2 puffs into the lungs every 4 (four) hours as needed for wheezing or shortness of breath. 1 each 0   atorvastatin (LIPITOR) 20 MG tablet Take 1 tablet (20 mg total) by mouth daily. 90 tablet 3   benzonatate (TESSALON) 100 MG capsule Take 1 capsule (100 mg total) by mouth every 8 (eight) hours. 21 capsule 0   bismuth subsalicylate (PEPTO BISMOL) 262 MG/15ML suspension Take 15 mLs by mouth every 6 (six) hours as needed for indigestion or diarrhea or loose stools.     levofloxacin (LEVAQUIN) 500 MG tablet Take 1 tablet (500 mg total) by mouth daily. 6 tablet 0   levothyroxine (SYNTHROID) 112 MCG tablet Take 1 tablet (112 mcg total) by mouth daily. 90 tablet 0   Multiple Vitamin (MULTIVITAMIN WITH MINERALS) TABS tablet Take 1 tablet by mouth daily.     naproxen (NAPROSYN) 500 MG tablet Take 1 tablet (500 mg total) by mouth 2 (two) times daily. 14 tablet 0   pantoprazole (PROTONIX) 40 MG tablet Take 1 tablet (40 mg total) by mouth daily. 30 tablet 3   No facility-administered medications prior to visit.    Allergies  Allergen Reactions   Benadryl [Diphenhydramine Hcl] Other (See Comments)    Makes patient extremely drowsy            Objective:    Physical Exam Constitutional:      General: She is not  in acute distress.    Appearance: Normal appearance. She is normal weight. She is not ill-appearing.  HENT:     Right Ear: Tympanic membrane normal.     Left  Ear: Tympanic membrane normal.     Nose: Nose normal. No congestion or rhinorrhea.     Right Turbinates: Not enlarged or swollen.     Left Turbinates: Not enlarged or swollen.     Right Sinus: No maxillary sinus tenderness or frontal sinus tenderness.     Left Sinus: No maxillary sinus tenderness or frontal sinus tenderness.     Mouth/Throat:     Mouth: Mucous membranes are moist.     Pharynx: No pharyngeal swelling, oropharyngeal exudate or posterior oropharyngeal erythema.     Tonsils: No tonsillar exudate.  Eyes:     Extraocular Movements: Extraocular movements intact.     Conjunctiva/sclera: Conjunctivae normal.     Pupils: Pupils are equal, round, and reactive to light.  Neck:     Thyroid: No thyroid mass.  Cardiovascular:     Rate and Rhythm: Normal rate and regular rhythm.     Pulses:          Carotid pulses are 2+ on the right side and 2+ on the left side.      Radial pulses are 2+ on the right side and 2+ on the left side.       Femoral pulses are 2+ on the right side and 2+ on the left side.      Popliteal pulses are 2+ on the right side and 2+ on the left side.       Dorsalis pedis pulses are 2+ on the right side and 2+ on the left side.       Posterior tibial pulses are 2+ on the right side and 2+ on the left side.  Pulmonary:     Effort: Pulmonary effort is normal.     Breath sounds: Normal breath sounds.  Musculoskeletal:     Right lower leg: No edema.     Left lower leg: No edema.  Lymphadenopathy:     Cervical:     Right cervical: No superficial cervical adenopathy.    Left cervical: No superficial cervical adenopathy.  Neurological:     Mental Status: She is alert.       BP (!) 106/58   Pulse 74   Temp 98.7 F (37.1 C)   Resp 16   Ht 5\' 1"  (1.549 m)   Wt 129 lb (58.5 kg)   LMP 04/06/2012    SpO2 96%   BMI 24.37 kg/m  Wt Readings from Last 3 Encounters:  02/04/22 129 lb (58.5 kg)  02/01/22 134 lb (60.8 kg)  01/27/22 134 lb (60.8 kg)     Health Maintenance Due  Topic Date Due   COVID-19 Vaccine (1) Never done   HIV Screening  Never done   Hepatitis C Screening  Never done   COLONOSCOPY (Pts 45-4422yrs Insurance coverage will need to be confirmed)  Never done   PAP SMEAR-Modifier  10/30/2008   MAMMOGRAM  09/04/2011   Zoster Vaccines- Shingrix (1 of 2) Never done   INFLUENZA VACCINE  01/21/2022    There are no preventive care reminders to display for this patient.  Lab Results  Component Value Date   TSH 10.86 (H) 12/13/2021   Lab Results  Component Value Date   WBC 10.4 02/01/2022   HGB 12.8 02/01/2022   HCT 38.3 02/01/2022   MCV 98.2 02/01/2022   PLT 305 02/01/2022   Lab Results  Component Value Date   NA 136 02/01/2022   K 4.1 02/01/2022   CO2 23 02/01/2022   GLUCOSE 132 (H) 02/01/2022  BUN 15 02/01/2022   CREATININE 0.91 02/01/2022   BILITOT 0.4 11/14/2021   ALKPHOS 93 11/14/2021   AST 38 (H) 11/14/2021   ALT 27 11/14/2021   PROT 6.9 11/14/2021   ALBUMIN 4.4 11/14/2021   CALCIUM 9.3 02/01/2022   ANIONGAP 9 02/01/2022   GFR 53.56 (L) 11/14/2021   Lab Results  Component Value Date   CHOL 279 (H) 11/14/2021   Lab Results  Component Value Date   HDL 67.40 11/14/2021   Lab Results  Component Value Date   LDLCALC 186 (H) 11/14/2021   Lab Results  Component Value Date   TRIG 131.0 11/14/2021   Lab Results  Component Value Date   CHOLHDL 4 11/14/2021   No results found for: "HGBA1C"    Assessment & Plan:   Problem List Items Addressed This Visit       Respiratory   Seasonal allergic rhinitis due to pollen    Continue nightly zyrtec       COPD (chronic obstructive pulmonary disease) with acute bronchitis (HCC)    Completed gold standard MMRC dyspnea scale and per this, pt needs LAMA LABA combo.  rx sent for anoro ellipta 1  puff once daily.  Referral to pulmonary sent, recommend spirometry testing.        Relevant Medications   umeclidinium-vilanterol (ANORO ELLIPTA) 62.5-25 MCG/ACT AEPB   Other Relevant Orders   Hemoglobin A1c   Ambulatory referral to Pulmonology     Digestive   Gastroesophageal reflux disease    Continue protonix 40 mg once daily. Try to decrease and or avoid spicy foods, fried fatty foods, and also caffeine and chocolate as these can increase heartburn symptoms.          Endocrine   Acquired hypothyroidism    Repeat tsh today pending results Continue levothyroxine 112 mcg once daily         Nervous and Auditory   Cervical spondylitis with radiculitis (HCC)    Wear brace on left wrist at night. See if this helps with improvement. Otherwise need to f/u with orthopedist as likely from cervicalgia.         Other   TOBACCO ABUSE    Referred to lung screening 10/2021, advised pt to call back to get scheduled for low dose CT screening.       Current smoker    Smoking cessation instruction/counseling given:  counseled patient on the dangers of tobacco use, advised patient to stop smoking, and reviewed strategies to maximize success       Hyperglycemia    Pt advised of the following: Work on a diabetic diet, try to incorporate exercise at least 20-30 a day for 3 days a week or more.  Ordering a1c pending results.      Chest pain on breathing    Stable at current however ordering stat ct angio.  Was supposed to be ordered at ER but pt declined to assess for PE.       Relevant Orders   CT Angio Chest W/Cm &/Or Wo Cm   DOE (dyspnea on exertion)   Chest tightness    Improving however still present. Will order CT angio.       Neuralgia - Primary    Ordering b12 folate and a1c pending results.  Suspected peripheral neuropathy, possible. And or due to hypothryoid (joint pains)      Relevant Orders   B12 and Folate Panel    Meds ordered this encounter  Medications    umeclidinium-vilanterol (  ANORO ELLIPTA) 62.5-25 MCG/ACT AEPB    Sig: Inhale 1 puff into the lungs daily at 6 (six) AM.    Dispense:  1 each    Refill:  2    Order Specific Question:   Supervising Provider    Answer:   BEDSOLE, AMY E [[id number]    Follow-up: No follow-ups on file.    [name], FNP

## 2022-02-04 NOTE — Assessment & Plan Note (Signed)
Continue nightly zyrtec

## 2022-02-04 NOTE — Telephone Encounter (Signed)
Called and left a message to return call to the office.

## 2022-02-04 NOTE — Patient Instructions (Addendum)
Start maintenance inhaler, that you will take daily.  Wash your mouth afterwards, swish and spit.  This is called Anoro.   A referral was placed today for pulmonary.  Please let us know if you have not heard back within 2 weeks about the referral.  CT today, stat.  After lab work in office.

## 2022-02-04 NOTE — Telephone Encounter (Signed)
Patient called in regarding a test she took today. She wanted to speak with someone. Thank you!

## 2022-02-04 NOTE — Progress Notes (Signed)
Confirmed emphysema on lung CT.  Start the inhaler prescribed today. See pulmonary as referred.  Otherwise no acute findings.   Really need to work on considering stopping smoking.

## 2022-02-04 NOTE — Assessment & Plan Note (Signed)
Ordering b12 folate and a1c pending results.  Suspected peripheral neuropathy, possible. And or due to hypothryoid (joint pains)

## 2022-02-04 NOTE — Assessment & Plan Note (Signed)
Continue protonix 40 mg once daily Try to decrease and or avoid spicy foods, fried fatty foods, and also caffeine and chocolate as these can increase heartburn symptoms.   

## 2022-02-04 NOTE — Assessment & Plan Note (Signed)
Repeat tsh today pending results Continue levothyroxine 112 mcg once daily

## 2022-02-04 NOTE — Assessment & Plan Note (Signed)
Completed gold standard MMRC dyspnea scale and per this, pt needs LAMA LABA combo.  rx sent for anoro ellipta 1 puff once daily.  Referral to pulmonary sent, recommend spirometry testing.

## 2022-02-04 NOTE — Assessment & Plan Note (Signed)
Pt advised of the following: Work on a diabetic diet, try to incorporate exercise at least 20-30 a day for 3 days a week or more.  Ordering a1c pending results 

## 2022-02-04 NOTE — Assessment & Plan Note (Signed)
Smoking cessation instruction/counseling given:  counseled patient on the dangers of tobacco use, advised patient to stop smoking, and reviewed strategies to maximize success 

## 2022-02-04 NOTE — Assessment & Plan Note (Signed)
Wear brace on left wrist at night. See if this helps with improvement. Otherwise need to f/u with orthopedist as likely from cervicalgia.

## 2022-02-04 NOTE — Assessment & Plan Note (Signed)
Referred to lung screening 10/2021, advised pt to call back to get scheduled for low dose CT screening.

## 2022-02-04 NOTE — Assessment & Plan Note (Signed)
Stable at current however ordering stat ct angio.  Was supposed to be ordered at ER but pt declined to assess for PE.

## 2022-02-05 NOTE — Progress Notes (Signed)
Prediabetic, work on diabetic diet exercise as tolerated.   B12 on lower end. I suggest daily vitamin B12 1000 mcg

## 2022-02-06 ENCOUNTER — Telehealth: Payer: Self-pay | Admitting: Family

## 2022-02-06 NOTE — Telephone Encounter (Signed)
  Encourage patient to contact the pharmacy for refills or they can request refills through [facility]  Did the patient contact the pharmacy:    LAST APPOINTMENT DATE: 02/04/22  NEXT APPOINTMENT DATE: N/A   MEDICATION: levofloxacin (LEVAQUIN) 500 MG tablet  benzonatate (TESSALON) 100 MG capsule  Is the patient out of medication? Yes  PHARMACY: [id number] - [place], [place] - [address] AT [facility] OF [address]  Let patient know to contact pharmacy at the end of the day to make sure medication is ready.  Please notify patient to allow 48-72 hours to process

## 2022-02-07 ENCOUNTER — Other Ambulatory Visit: Payer: Self-pay | Admitting: Family

## 2022-02-07 DIAGNOSIS — R051 Acute cough: Secondary | ICD-10-CM

## 2022-02-07 DIAGNOSIS — J209 Acute bronchitis, unspecified: Secondary | ICD-10-CM

## 2022-02-07 MED ORDER — BENZONATATE 100 MG PO CAPS: 100.0000 mg | ORAL_CAPSULE | Freq: Three times a day (TID) | ORAL | 0 refills | Status: DC

## 2022-02-07 MED ORDER — PREDNISONE 20 MG PO TABS: ORAL_TABLET | ORAL | 0 refills | Status: DC

## 2022-02-07 MED ORDER — UMECLIDINIUM-VILANTEROL 62.5-25 MCG/ACT IN AEPB
1.0000 | INHALATION_SPRAY | Freq: Every day | RESPIRATORY_TRACT | 2 refills | Status: DC
Start: 2022-02-07 — End: 2022-02-07

## 2022-02-07 NOTE — Progress Notes (Signed)
Was able to call and speak with pt as she returned call. States nothing has changed since visit in office except for SOME slight improvement with breathing since starting anoro inhaler yesterday. Going to send in prednisone 40 mg qd for five days over weekend. Pt instructed if any worsening sob go to er and or call 911 over weekend. Denies chest pain at current. Ekg reviewed with ER, stable. NSR.   Pulmonary appt 9/8 they will assess for disability if necessary.  Did state I would start FMLA until then if needed for work.   Tresa Endo  and [place]ilo just an [place].no need to do anything further.

## 2022-02-07 NOTE — Progress Notes (Signed)
Attempted to call pt but went to voicemail. I also wanted to leave mychart message however pt does not have mychart set up.   Please call pt first thing Monday am to assess how she is as this should have been urgently triaged. I am also sending to triage to assess first thing Monday am as well. May need to go to urgent care and or ER

## 2022-02-07 NOTE — Telephone Encounter (Signed)
This is an antibiotic not something we refill.

## 2022-02-09 ENCOUNTER — Encounter: Payer: Self-pay | Admitting: Certified Registered Nurse Anesthetist

## 2022-02-09 ENCOUNTER — Other Ambulatory Visit: Payer: Self-pay | Admitting: Family

## 2022-02-09 DIAGNOSIS — R1013 Epigastric pain: Secondary | ICD-10-CM

## 2022-02-09 DIAGNOSIS — K219 Gastro-esophageal reflux disease without esophagitis: Secondary | ICD-10-CM

## 2022-02-10 ENCOUNTER — Telehealth: Payer: Self-pay

## 2022-02-10 NOTE — Telephone Encounter (Signed)
Place letter in basket up front.

## 2022-02-10 NOTE — Telephone Encounter (Signed)
-----   Message from [name], [place] sent at 02/07/2022  5:55 PM EDT ----- Printed out letter for work for pt. Leaving in my outbox in my office. Told them they could pick up Monday am. Just wanted you all aware :)

## 2022-02-10 NOTE — Telephone Encounter (Signed)
Patient picked up letter 02/10/22

## 2022-02-14 ENCOUNTER — Ambulatory Visit: Payer: Managed Care, Other (non HMO) | Admitting: Gastroenterology

## 2022-02-14 ENCOUNTER — Encounter: Payer: Self-pay | Admitting: Gastroenterology

## 2022-02-14 VITALS — BP 113/66 | HR 84 | Temp 97.3°F | Resp 15 | Ht 61.0 in | Wt 134.0 lb

## 2022-02-14 DIAGNOSIS — R1319 Other dysphagia: Secondary | ICD-10-CM | POA: Diagnosis not present

## 2022-02-14 DIAGNOSIS — R1013 Epigastric pain: Secondary | ICD-10-CM | POA: Diagnosis not present

## 2022-02-14 DIAGNOSIS — Z1211 Encounter for screening for malignant neoplasm of colon: Secondary | ICD-10-CM | POA: Diagnosis not present

## 2022-02-14 DIAGNOSIS — K222 Esophageal obstruction: Secondary | ICD-10-CM

## 2022-02-14 DIAGNOSIS — G8929 Other chronic pain: Secondary | ICD-10-CM

## 2022-02-14 DIAGNOSIS — R12 Heartburn: Secondary | ICD-10-CM | POA: Diagnosis not present

## 2022-02-14 DIAGNOSIS — K449 Diaphragmatic hernia without obstruction or gangrene: Secondary | ICD-10-CM

## 2022-02-14 MED ORDER — SODIUM CHLORIDE 0.9 % IV SOLN: 500.0000 mL | Freq: Once | INTRAVENOUS | Status: DC

## 2022-02-14 NOTE — Op Note (Signed)
[facility] Patient Name: Lauren Beck Procedure Date: 02/14/2022 1:59 PM MRN: [id number] Endoscopist: Mallie Mussel L. Loletha Carrow , MD Age: 61 Referring MD:  Date of Birth: 04-07-1961 Gender: Female Account #: [id number] Procedure:                Colonoscopy Indications:              Screening for colorectal malignant neoplasm, This                            is the patient's first colonoscopy Medicines:                Monitored Anesthesia Care Procedure:                Pre-Anesthesia Assessment:                           - Prior to the procedure, a History and Physical                            was performed, and patient medications and                            allergies were reviewed. The patient's tolerance of                            previous anesthesia was also reviewed. The risks                            and benefits of the procedure and the sedation                            options and risks were discussed with the patient.                            All questions were answered, and informed consent                            was obtained. Prior Anticoagulants: The patient has                            taken no previous anticoagulant or antiplatelet                            agents. ASA Grade Assessment: III - A patient with                            severe systemic disease. After reviewing the risks                            and benefits, the patient was deemed in                            satisfactory condition to undergo the procedure.  After obtaining informed consent, the colonoscope                            was passed under direct vision. Throughout the                            procedure, the patient's blood pressure, pulse, and                            oxygen saturations were monitored continuously. The                            CF [id number] #[id number] was introduced through the anus                            and advanced to the  the cecum, identified by                            appendiceal orifice and ileocecal valve. The                            colonoscopy was performed with difficulty due to                            poor bowel prep and a tortuous colon. The patient                            tolerated the procedure well. The quality of the                            bowel preparation was poor. The ileocecal valve,                            appendiceal orifice, and rectum were photographed.                            The bowel preparation used was GoLYTELY. Scope In: 2:08:48 PM Scope Out: 2:20:39 PM Scope Withdrawal Time: 0 hours 6 minutes 55 seconds  Total Procedure Duration: 0 hours 11 minutes 51 seconds  Findings:                 The perianal and digital rectal examinations were                            normal.                           Semi-liquid stool was found in the entire colon,                            interfering with visualization. Lavage of the area                            was performed, resulting in incomplete clearance  with continued poor visualization.                           The left colon was tortuous.                           An area of melanosis was found in the rectum.                           Retroflexion in the rectum was not performed due to                            anatomy.                           The exam was otherwise without abnormality. Complications:            No immediate complications. Estimated Blood Loss:     Estimated blood loss: none. Impression:               - Preparation of the colon was poor.                           - Stool in the entire examined colon.                           - Tortuous colon.                           - Melanosis in the colon.                           - The examination was otherwise normal.                           - No specimens collected. Recommendation:           - Patient has a contact  number available for                            emergencies. The signs and symptoms of potential                            delayed complications were discussed with the                            patient. Return to normal activities tomorrow.                            Written discharge instructions were provided to the                            patient.                           - Resume previous diet.                           -  Continue present medications.                           - Repeat colonoscopy in 1 year for screening                            purposes. Suprep followed by golytely prep on two                            succsessive days.                           - See the other procedure note for documentation of                            additional recommendations. Harding Thomure L. Myrtie Neither, MD 02/14/2022 2:25:31 PM This report has been signed electronically.

## 2022-02-14 NOTE — Op Note (Signed)
[facility] Patient Name: Lauren Beck Procedure Date: 02/14/2022 1:51 PM MRN: [id number] Endoscopist: Sherilyn Cooter L. Myrtie Neither , MD Age: 61 Referring MD:  Date of Birth: 08-13-1960 Gender: Female Account #: [id number] Procedure:                Upper GI endoscopy Indications:              Epigastric abdominal pain, Esophageal dysphagia,                            Heartburn Medicines:                Monitored Anesthesia Care Procedure:                Pre-Anesthesia Assessment:                           - Prior to the procedure, a History and Physical                            was performed, and patient medications and                            allergies were reviewed. The patient's tolerance of                            previous anesthesia was also reviewed. The risks                            and benefits of the procedure and the sedation                            options and risks were discussed with the patient.                            All questions were answered, and informed consent                            was obtained. Prior Anticoagulants: The patient has                            taken no previous anticoagulant or antiplatelet                            agents. ASA Grade Assessment: III - A patient with                            severe systemic disease. After reviewing the risks                            and benefits, the patient was deemed in                            satisfactory condition to undergo the procedure.  After obtaining informed consent, the endoscope was                            passed under direct vision. Throughout the                            procedure, the patient's blood pressure, pulse, and                            oxygen saturations were monitored continuously. The                            Endoscope was introduced through the mouth, and                            advanced to the second part of duodenum. The  upper                            GI endoscopy was somewhat difficult due to                            coughing/gagging. The patient tolerated the                            procedure (coughing and gagging). Scope In: Scope Out: Findings:                 A 4-5 cm hiatal hernia was present. EGJ 32-33cm                            from incisors.                           A mild Schatzki ring was found at the                            gastroesophageal junction. This was biopsied with a                            cold forceps for ring disruption.( dilation would                            have been technically challenging due to the above                            noted factors)                           The lower third of the esophagus was tortuous due                            to the hiatal hernia.                           The entire examined stomach was normal. Several  biopsies were obtained on the greater curvature of                            the gastric body, on the lesser curvature of the                            gastric body, on the greater curvature of the                            gastric antrum and on the lesser curvature of the                            gastric antrum with cold forceps for histology.                            (r/o H pylori)                           The cardia and gastric fundus were normal on                            retroflexion.                           The examined duodenum was normal. Complications:            No immediate complications. Estimated Blood Loss:     Estimated blood loss was minimal. Impression:               - 4 cm hiatal hernia.                           - Mild Schatzki ring. Biopsied.                           - Tortuous esophagus.                           - Normal stomach.                           - Normal examined duodenum.                           - Several biopsies were obtained on the greater                             curvature of the gastric body, on the lesser                            curvature of the gastric body, on the greater                            curvature of the gastric antrum and on the lesser  curvature of the gastric antrum.                           The esophageal ring is causing mild luminal                            narrowing. Dysphagia appears largely related to                            distal esophageal tortuosity/anatomic distortion                            from the hiatal hernia. Recommendation:           - Patient has a contact number available for                            emergencies. The signs and symptoms of potential                            delayed complications were discussed with the                            patient. Return to normal activities tomorrow.                            Written discharge instructions were provided to the                            patient.                           - Resume previous diet.                           - Continue present medications.                           - Await pathology results.                           - See the other procedure note for documentation of                            additional recommendations.                           - Follow an antireflux regimen indefinitely.                           - Do your best to stop smoking. Dameisha Tschida L. Myrtie Neither, MD 02/14/2022 2:43:31 PM This report has been signed electronically.

## 2022-02-14 NOTE — Progress Notes (Signed)
Report given to PACU, vss 

## 2022-02-14 NOTE — Progress Notes (Signed)
1403 Robinul 0.1 mg IV given due large amount of secretions upon assessment.  MD made aware, vss

## 2022-02-14 NOTE — Patient Instructions (Signed)
Handout given on GERD/Hiatal Hernia.  YOU HAD AN ENDOSCOPIC PROCEDURE TODAY AT THE [facility]:   Refer to the procedure report that was given to you for any specific questions about what was found during the examination.  If the procedure report does not answer your questions, please call your gastroenterologist to clarify.  If you requested that your care partner not be given the details of your procedure findings, then the procedure report has been included in a sealed envelope for you to review at your convenience later.  YOU SHOULD EXPECT: Some feelings of bloating in the abdomen. Passage of more gas than usual.  Walking can help get rid of the air that was put into your GI tract during the procedure and reduce the bloating. If you had a lower endoscopy (such as a colonoscopy or flexible sigmoidoscopy) you may notice spotting of blood in your stool or on the toilet paper. If you underwent a bowel prep for your procedure, you may not have a normal bowel movement for a few days.  Please Note:  You might notice some irritation and congestion in your nose or some drainage.  This is from the oxygen used during your procedure.  There is no need for concern and it should clear up in a day or so.  SYMPTOMS TO REPORT IMMEDIATELY:  Following lower endoscopy (colonoscopy or flexible sigmoidoscopy):  Excessive amounts of blood in the stool  Significant tenderness or worsening of abdominal pains  Swelling of the abdomen that is new, acute  Fever of 100F or higher  Following upper endoscopy (EGD)  Vomiting of blood or coffee ground material  New chest pain or pain under the shoulder blades  Painful or persistently difficult swallowing  New shortness of breath  Fever of 100F or higher  Black, tarry-looking stools  For urgent or emergent issues, a gastroenterologist can be reached at any hour by calling [phone]. Do not use MyChart messaging for urgent concerns.    DIET:  We do  recommend a small meal at first, but then you may proceed to your regular diet.  Drink plenty of fluids but you should avoid alcoholic beverages for 24 hours.  ACTIVITY:  You should plan to take it easy for the rest of today and you should NOT DRIVE or use heavy machinery until tomorrow (because of the sedation medicines used during the test).    FOLLOW UP: Our staff will call the number listed on your records the next business day following your procedure.  We will call around 7:15- 8:00 am to check on you and address any questions or concerns that you may have regarding the information given to you following your procedure. If we do not reach you, we will leave a message.  If you develop any symptoms (ie: fever, flu-like symptoms, shortness of breath, cough etc.) before then, please call [phone].  If you test positive for Covid 19 in the 2 weeks post procedure, please call and report this information to [place].    If any biopsies were taken you will be contacted by phone or by letter within the next 1-3 weeks.  Please call us at [phone] if you have not heard about the biopsies in 3 weeks.    SIGNATURES/CONFIDENTIALITY: You and/or your care partner have signed paperwork which will be entered into your electronic medical record.  These signatures attest to the fact that that the information above on your After Visit Summary has been reviewed and is understood.  Full responsibility of the confidentiality of this discharge information lies with you and/or your care-partner.  

## 2022-02-14 NOTE — Progress Notes (Signed)
No changes to clinical history since GI office visit on 01/27/22.  The patient is appropriate for an endoscopic procedure in the ambulatory setting.  - Rio Taber Danis, MD    

## 2022-02-14 NOTE — Progress Notes (Signed)
1409 Ephedrine 10 mg given IV due to low BP, MD updated.

## 2022-02-14 NOTE — Progress Notes (Signed)
Pt's states no medical or surgical changes since previsit or office visit. 

## 2022-02-14 NOTE — Progress Notes (Signed)
Called to room to assist during endoscopic procedure.  Patient ID and intended procedure confirmed with present staff. Received instructions for my participation in the procedure from the performing physician.  

## 2022-02-17 ENCOUNTER — Telehealth: Payer: Self-pay | Admitting: *Deleted

## 2022-02-17 NOTE — Telephone Encounter (Signed)
Attempted to call patient for their post-procedure follow-up call. No answer. Left voicemail.   

## 2022-02-20 ENCOUNTER — Encounter: Payer: Self-pay | Admitting: Gastroenterology

## 2022-02-26 NOTE — Progress Notes (Signed)
Synopsis: Referred for COPD by [name], FNP  Subjective:   PATIENT ID: Lauren Beck GENDER: female DOB: [birth year], MRN: [id number]  Chief Complaint  Patient presents with   Pulmonary Consult    Referred by [name], FNP. Pt c/o SOB for the past 1-2 months. She states that she gets winded walking up an incline. She notices chest tightness when she gets too hot. She is using her albuterol inhaler 3-4 x per wk on average. She has some prod with with yellow to clear sputum.    61yF with history of AR, COPD, GERD, hypothyroid, smoking referred for COPD  Dyspnea and left upper chest pain (she thinks it is exertional). Albuterol helps some. She's also got anoro but isn't sure if it's helpful - has been using it for about a month. Last course of prednisone was last month. She isn't sure if it helped. She does have a cough but it's mostly dry. No BLE swelling, orthopnea.   She has never had a stress test or seen a cardiologist  Otherwise pertinent review of systems is negative.  She has no family history of lung disease  She works in [employer]. She is a crusher. 1ppd 60-40y smoker, currently half ppd. Vaping nicotine. No MJ. She has a dog. She is from [place].   Past Medical History:  Diagnosis Date   Allergic rhinitis    Arthritis    Bronchitis    COPD (chronic obstructive pulmonary disease) (HCC)    Excessive or frequent menstruation    GERD (gastroesophageal reflux disease)    History of migraines    Hypothyroidism    Nonspecific abnormal electrocardiogram (ECG) (EKG)    Pure hypercholesterolemia    Tobacco use disorder    Unspecified adjustment reaction      Family History  Problem Relation Age of Onset   Other Father        No contact--unknown   Breast cancer Sister    Thyroid disease Daughter    Anxiety disorder Daughter    OCD Daughter    Rectal cancer Neg Hx    Esophageal cancer Neg Hx    Stomach cancer Neg Hx    Colon cancer Neg Hx      Past Surgical  History:  Procedure Laterality Date   HAMMER TOE SURGERY Bilateral    PELVIC LAPAROSCOPY     exploratory, thought might have a cyst, not seen   TUBAL LIGATION      Social History   Socioeconomic History   Marital status: Divorced    Spouse name: Not on file   Number of children: 3   Years of education: Not on file   Highest education level: Not on file  Occupational History   Occupation: Company secretary  Tobacco Use   Smoking status: Every Day    Packs/day: 1.50    Years: 45.00    Total pack years: 67.50    Types: Cigarettes    Start date: 1980   Smokeless tobacco: Never   Tobacco comments:    Currently 1/2 ppd 02/28/22 [name], CMA  Vaping Use   Vaping Use: Some days   Substances: Nicotine  Substance and Sexual Activity   Alcohol use: Yes    Alcohol/week: 1.0 standard drink of alcohol    Types: 1 Glasses of wine per week    Comment: occasional wine   Drug use: No    Comment: tried pot once, didn't like it   Sexual activity: Yes    Partners: Male  Birth control/protection: Surgical  Other Topics Concern   Not on file  Social History Narrative   Married--much stress at home with marriage      3 children         Social Determinants of Health   Financial Resource Strain: Not on file  Food Insecurity: Not on file  Transportation Needs: Not on file  Physical Activity: Not on file  Stress: Not on file  Social Connections: Not on file  Intimate Partner Violence: Not on file     Allergies  Allergen Reactions   Benadryl [Diphenhydramine Hcl] Other (See Comments)    Makes patient extremely drowsy       Outpatient Medications Prior to Visit  Medication Sig Dispense Refill   acetaminophen (TYLENOL) 500 MG tablet Take 500 mg by mouth every 6 (six) hours as needed.     albuterol (VENTOLIN HFA) 108 (90 Base) MCG/ACT inhaler Inhale 2 puffs into the lungs every 4 (four) hours as needed for wheezing or shortness of breath. 1 each 0   bismuth subsalicylate  (PEPTO BISMOL) 262 MG/15ML suspension Take 15 mLs by mouth every 6 (six) hours as needed for indigestion or diarrhea or loose stools.     cyanocobalamin (VITAMIN B12) 100 MCG tablet Take 100 mcg by mouth daily.     levothyroxine (SYNTHROID) 112 MCG tablet Take 1 tablet (112 mcg total) by mouth daily. 90 tablet 0   Multiple Vitamin (MULTIVITAMIN WITH MINERALS) TABS tablet Take 1 tablet by mouth daily.     pantoprazole (PROTONIX) 40 MG tablet TAKE 1 TABLET BY MOUTH EVERY DAY 90 tablet 1   benzonatate (TESSALON) 100 MG capsule Take 1 capsule (100 mg total) by mouth every 8 (eight) hours. 21 capsule 0   atorvastatin (LIPITOR) 20 MG tablet Take 1 tablet (20 mg total) by mouth daily. 90 tablet 3   naproxen (NAPROSYN) 500 MG tablet Take 1 tablet (500 mg total) by mouth 2 (two) times daily. 14 tablet 0   No facility-administered medications prior to visit.       Objective:   Physical Exam:  General appearance: 61 y.o., female, NAD, conversant, female, NAD, conversant  Eyes: anicteric sclerae; PERRL, tracking appropriately HENT: NCAT; MMM Neck: Trachea midline; no lymphadenopathy, no JVD Lungs: CTAB, no crackles, no wheeze, with normal respiratory effort CV: RRR, no murmur  Abdomen: Soft, non-tender; non-distended, BS present  Extremities: No peripheral edema, warm Skin: Normal turgor and texture; no rash Psych: Appropriate affect Neuro: Alert and oriented to person and place, no focal deficit     Vitals:   02/28/22 1435  BP: 102/60  Pulse: 90  Temp: 98 F (36.7 C)  TempSrc: Oral  SpO2: 97%  Weight: 131 lb (59.4 kg)  Height: 5\' 1"  (1.549 m)   97% on RA BMI Readings from Last 3 Encounters:  02/28/22 24.75 kg/m  02/14/22 25.32 kg/m  02/04/22 24.37 kg/m   Wt Readings from Last 3 Encounters:  02/28/22 131 lb (59.4 kg)  02/14/22 134 lb (60.8 kg)  02/04/22 129 lb (58.5 kg)     CBC    Component Value Date/Time   WBC 10.4 02/01/2022 1800   RBC 3.90 02/01/2022 1800   HGB 12.8 02/01/2022 1800    HCT 38.3 02/01/2022 1800   PLT 305 02/01/2022 1800   MCV 98.2 02/01/2022 1800   MCH 32.8 02/01/2022 1800   MCHC 33.4 02/01/2022 1800   RDW 11.9 02/01/2022 1800   LYMPHSABS 1.5 02/01/2022 1800   MONOABS 1.0 02/01/2022 1800   EOSABS  0.0 02/01/2022 1800   BASOSABS 0.0 02/01/2022 1800    Eos 0-200  Chest Imaging: CTA Chest 02/04/22 reviewed by me with upper lobe predominant emphysema, bronchial wall thickening  Pulmonary Functions Testing Results:     No data to display              Assessment & Plan:   # Emphysema # Suspected COPD Gold B  # Smoking Smoking cessation instruction/counseling given:  counseled patient on the dangers of tobacco use, advised patient to stop smoking, and reviewed strategies to maximize success. Declines chantix, patches. Will try gum OTC.   Plan: - Start stiolto 2 puffs once daily, stop anoro - albuterol 1-2 puffs as needed - you will be called to schedule CT Chest for lung cancer screening - nicotine gum OTC prn - needs to bring copy of spirometry from OSH - see you in 8 weeks or sooner if need be!     Omar Person, MD [facility] 02/28/2022 5:25 PM

## 2022-02-28 ENCOUNTER — Ambulatory Visit (INDEPENDENT_AMBULATORY_CARE_PROVIDER_SITE_OTHER): Payer: Managed Care, Other (non HMO) | Admitting: Student

## 2022-02-28 ENCOUNTER — Encounter: Payer: Self-pay | Admitting: Student

## 2022-02-28 ENCOUNTER — Telehealth: Payer: Self-pay | Admitting: Student

## 2022-02-28 VITALS — BP 102/60 | HR 90 | Temp 98.0°F | Ht 61.0 in | Wt 131.0 lb

## 2022-02-28 DIAGNOSIS — R051 Acute cough: Secondary | ICD-10-CM | POA: Diagnosis not present

## 2022-02-28 DIAGNOSIS — F17200 Nicotine dependence, unspecified, uncomplicated: Secondary | ICD-10-CM

## 2022-02-28 DIAGNOSIS — J449 Chronic obstructive pulmonary disease, unspecified: Secondary | ICD-10-CM

## 2022-02-28 MED ORDER — BENZONATATE 100 MG PO CAPS: 100.0000 mg | ORAL_CAPSULE | Freq: Three times a day (TID) | ORAL | 0 refills | Status: DC

## 2022-02-28 NOTE — Telephone Encounter (Signed)
What is least expensvie laba/lama for her?  Thanks!

## 2022-02-28 NOTE — Patient Instructions (Signed)
-   Start stiolto 2 puffs once daily, stop anoro - albuterol 1-2 puffs as needed - you will be called to schedule CT Chest for lung cancer screening - see you in 8 weeks or sooner if need be!

## 2022-03-03 ENCOUNTER — Other Ambulatory Visit (HOSPITAL_COMMUNITY): Payer: Self-pay

## 2022-03-03 MED ORDER — STIOLTO RESPIMAT 2.5-2.5 MCG/ACT IN AERS: 2.0000 | INHALATION_SPRAY | Freq: Every day | RESPIRATORY_TRACT | 11 refills | Status: DC

## 2022-03-06 ENCOUNTER — Telehealth: Payer: Self-pay | Admitting: Family

## 2022-03-06 ENCOUNTER — Other Ambulatory Visit: Payer: Self-pay | Admitting: Family

## 2022-03-06 DIAGNOSIS — K219 Gastro-esophageal reflux disease without esophagitis: Secondary | ICD-10-CM

## 2022-03-06 DIAGNOSIS — R1013 Epigastric pain: Secondary | ICD-10-CM

## 2022-03-06 MED ORDER — PANTOPRAZOLE SODIUM 40 MG PO TBEC: 40.0000 mg | DELAYED_RELEASE_TABLET | Freq: Every day | ORAL | 1 refills | Status: DC

## 2022-03-06 NOTE — Telephone Encounter (Signed)
Per last note with you  GERD, protonix 40 mg once daily which has been helpful. Has appt with GI 02/14/22. Is planning to get colonoscopy and EGD at that visit.    Should she be getting from GI? Ok to get refill?

## 2022-03-06 NOTE — Telephone Encounter (Signed)
Patient called back she wanted to have the H pylori test sent over. Advised they are able to see our lab results. She will let them know. If she has any questions she will give [place] a call.  No further action needed at this time.

## 2022-03-06 NOTE — Telephone Encounter (Signed)
Left message to return call to our office.  

## 2022-03-06 NOTE — Telephone Encounter (Signed)
  Encourage patient to contact the pharmacy for refills or they can request refills through [facility]  Did the patient contact the pharmacy: No  LAST APPOINTMENT DATE: 02/04/2022  NEXT APPOINTMENT DATE: N/A  MEDICATION: pantoprazole (PROTONIX) 40 MG tablet  Is the patient out of medication? No  If not, how much is left? 4 left  PHARMACY: [id number] - [place], [place] - [address] AT [facility] OF [address]  Let patient know to contact pharmacy at the end of the day to make sure medication is ready.  Please notify patient to allow 48-72 hours to process

## 2022-03-06 NOTE — Telephone Encounter (Signed)
Patient called in and was wanting to know if her results from her breathing test could be sent over to her lung doctor. His name is Dr. Thora Lance. Please advise. Thank you!

## 2022-03-13 ENCOUNTER — Other Ambulatory Visit: Payer: Self-pay

## 2022-03-13 DIAGNOSIS — E039 Hypothyroidism, unspecified: Secondary | ICD-10-CM

## 2022-03-13 MED ORDER — LEVOTHYROXINE SODIUM 112 MCG PO TABS: 112.0000 ug | ORAL_TABLET | Freq: Every day | ORAL | 0 refills | Status: DC

## 2022-03-24 ENCOUNTER — Ambulatory Visit (INDEPENDENT_AMBULATORY_CARE_PROVIDER_SITE_OTHER): Payer: Managed Care, Other (non HMO) | Admitting: Family Medicine

## 2022-03-24 ENCOUNTER — Ambulatory Visit: Payer: Self-pay

## 2022-03-24 ENCOUNTER — Telehealth: Payer: Self-pay | Admitting: Physical Medicine and Rehabilitation

## 2022-03-24 ENCOUNTER — Encounter: Payer: Self-pay | Admitting: Family Medicine

## 2022-03-24 ENCOUNTER — Encounter: Payer: Self-pay | Admitting: Orthopedic Surgery

## 2022-03-24 ENCOUNTER — Ambulatory Visit: Payer: Managed Care, Other (non HMO) | Admitting: Orthopedic Surgery

## 2022-03-24 ENCOUNTER — Ambulatory Visit (INDEPENDENT_AMBULATORY_CARE_PROVIDER_SITE_OTHER): Payer: Managed Care, Other (non HMO)

## 2022-03-24 VITALS — BP 94/60 | HR 104 | Temp 98.0°F | Ht 61.0 in | Wt 127.0 lb

## 2022-03-24 VITALS — BP 114/76 | HR 101 | Ht 61.0 in | Wt 127.0 lb

## 2022-03-24 DIAGNOSIS — M5030 Other cervical disc degeneration, unspecified cervical region: Secondary | ICD-10-CM | POA: Diagnosis not present

## 2022-03-24 DIAGNOSIS — R051 Acute cough: Secondary | ICD-10-CM

## 2022-03-24 DIAGNOSIS — M5412 Radiculopathy, cervical region: Secondary | ICD-10-CM

## 2022-03-24 DIAGNOSIS — M542 Cervicalgia: Secondary | ICD-10-CM

## 2022-03-24 DIAGNOSIS — M25512 Pain in left shoulder: Secondary | ICD-10-CM | POA: Diagnosis not present

## 2022-03-24 MED ORDER — CYCLOBENZAPRINE HCL 10 MG PO TABS: 5.0000 mg | ORAL_TABLET | Freq: Every evening | ORAL | 1 refills | Status: DC | PRN

## 2022-03-24 MED ORDER — PREDNISONE 20 MG PO TABS: ORAL_TABLET | ORAL | 0 refills | Status: DC

## 2022-03-24 MED ORDER — BENZONATATE 100 MG PO CAPS: 100.0000 mg | ORAL_CAPSULE | Freq: Three times a day (TID) | ORAL | 1 refills | Status: DC

## 2022-03-24 NOTE — Telephone Encounter (Signed)
IC s/w patient and she saw Dr Laurance Flatten today. She said her concerns have been addressed.
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
## 2022-03-24 NOTE — Telephone Encounter (Signed)
Patient is here. Says the injection by Dr. Ernestina Patches did not help.

## 2022-03-24 NOTE — Progress Notes (Signed)
Orthopedic Spine Surgery Office Note  Assessment: Patient is a 61 y.o. female with chronic, stable neck and left shoulder pain that is possibly radicular in nature. She responded to an injection and has a positive spurling that would point towards her cervical spine as the source. She has foraminal stenosis at C4/5 and C5/6. However, she also has physical exam findings that point towards her shoulder as the origin.    Plan: -She has tried conservative treatments for her cervical radiculopathy with no lasting relief. At this point, she would be a candidate for surgery but her physical exam points to her left shoulder being a potential source. Accordingly, I recommended an MRI of that shoulder to rule out a rotator cuff tear and biceps tendon pathology.  -Before any cervical spine surgery for radiculopathy to be considered, she would need to be completely nicotine free. Explained this to her and encouraged smoking cessation.  -She can continue with activity modification and over-the-counter medications -Patient has tried PT, cervical injection, activity modification, ibuprofen  -Patient should return to office in 6 weeks to discuss her MRI and re-examine her neck and shoulder, repeat x-rays of cervical spine at next visit: none   Patient expressed understanding of the plan and all questions were answered to their satisfaction.   ___________________________________________________________________________   History:  Patient is a 61 y.o. female who presents today for cervical spine. The patient has now had over 1 year of neck pain and left shoulder pain. Pain radiates from her neck into her shoulder. It goes to the deltoid area and scapula. She feels the pain with activity and at night. Rest does help with the pain. She has not noticed that it is worse with overhead activities. She did a get cervical spine injection that provided her with 2 months of relief but then the pain returned. Ibuprofen gives  her very temporary relief.    Weakness: denies Difficulty with fine motor skills (e.g., buttoning shirts, handwriting): denies Symptoms of imbalance: denies Paresthesias and numbness: denies Bowel or bladder incontinence: denies Saddle anesthesia: denies  Treatments tried: PT, cervical injection, activity modification, ibuprofen  Review of systems: Denies fevers and chills, night sweats, unexplained weight loss, history of cancer, pain that wakes them at night  Past medical history: Allergic rhinitis GERD COPD HLD  Allergies: benadryl  Past surgical history:  Hammer toe surgery Pelvic laparoscopy Tubal ligation  Social history: Reports use of nicotine product (smoking, vaping, patches, smokeless) - 0.5ppd Alcohol use: Yes, 1-2 drinks per week Denies recreational drug use Happy with her current employment, does not her pain to keep interfering with her work  Physical Exam:  General: no acute distress, appears stated age Neurologic: alert, answering questions appropriately, following commands Respiratory: unlabored breathing on room air, symmetric chest rise Psychiatric: appropriate affect, normal cadence to speech   MSK (spine):  -Strength exam      Left  Right Grip strength                5/5  5/5 Interosseus   5/5   5/5 Wrist extension  5/5  4/5 Wrist flexion   5/5  4/5 Elbow flexion   5/5  5/5 Deltoid    4/5  5/5  EHL    5/5  5/5 TA    5/5  5/5 GSC    5/5  5/5 Knee extension  5/5  5/5 Hip flexion   5/5  5/5  Weakness on right wrist due to wrist pain, right deltoid weakness due to  pain with abduction  -Sensory exam    Sensation intact to light touch in L3-S1 nerve distributions of bilateral lower extremities  Sensation intact to light touch in C5-T1 nerve distributions of bilateral upper extremities  -Brachioradialis DTR: 2/4 on the left, 2/4 on the right -Biceps DTR: 2/4 on the left, 2/4 on the right -Achilles DTR: 2/4 on the left, 2/4 on the  right -Patellar tendon DTR: 2/4 on the left, 2/4 on the right  -Spurling: positive on the left, negative on the right -Hoffman sign: negative bilaterally -Clonus: no beats bilaterally -Interosseous wasting: none -Grip and release test: negative -Gait: normal -Imbalance with tandem gait: none  Left shoulder exam: positive hawkins, TTP over the bicipital groove and AC joint, pain at extremes of internal and external rotation, able FE to 160 degrees, ER to 45, IR to belt line, pain with jobe test, negative drop arm sign, able to perform lift off, negative belly press, positive speeds Right shoulder exam: no pain through range of motion, no TTP over the shoulder  LEFT SIDE below for peripheral nerve exam Tinel's at wrist: negative  Phalen's at wrist: negative Dukan's: negative  Tinel's at elbow: negative  Phalen's at elbow: negative  Imaging: XR of the cervical spine from 03/24/2022 was independently reviewed and interpreted, showing no evidence of instability, no acute osseous abnormality, disc height loss at C4/5 and C5/6  XR of the left shoulder from 03/24/2022 was independently reviewed and interpreted, showing no dislocation, AC joint arthritis, minimal glenohumeral joint space narrowing, no acute osseous abnormality  MRI of the cervical spine from 01/09/2022 was reviewed and independently interpreted, showing DDD at C4/5 and C5/6. C4/5 foraminal stenosis (worse on left) and bilateral C5/6 foraminal stenosis. Disc bulges at C4/5 and C5/6 resulting in mild central stenosis. There is CSF around the cord at those levels on the sagittal. No T2 cord signal change.    Patient name: Lauren Beck Patient MRN: [id number] Date of visit: 03/24/22

## 2022-04-12 ENCOUNTER — Other Ambulatory Visit: Payer: Managed Care, Other (non HMO)

## 2022-04-24 NOTE — Progress Notes (Deleted)
Synopsis: Referred for COPD by [name], FNP  Subjective:   PATIENT ID: Lauren Beck GENDER: female DOB: [birth year], MRN: [id number]  No chief complaint on file.  61yF with history of AR, COPD, GERD, hypothyroid, smoking referred for COPD  Dyspnea and left upper chest pain (she thinks it is exertional). Albuterol helps some. She's also got anoro but isn't sure if it's helpful - has been using it for about a month. Last course of prednisone was last month. She isn't sure if it helped. She does have a cough but it's mostly dry. No BLE swelling, orthopnea.   She has never had a stress test or seen a cardiologist  She has no family history of lung disease  She works in [employer]. She is a crusher. 1ppd 30-40y smoker, currently half ppd. Vaping nicotine. No MJ. She has a dog. She is from [place].   Interval HPI: Started on stiolto last visit, NRT gum, encouraged to bring results of spiro OSH, referral to lung cancer screening program made    Otherwise pertinent review of systems is negative.  Past Medical History:  Diagnosis Date   Allergic rhinitis    Arthritis    Bronchitis    COPD (chronic obstructive pulmonary disease) (HCC)    Excessive or frequent menstruation    GERD (gastroesophageal reflux disease)    History of migraines    Hypothyroidism    Nonspecific abnormal electrocardiogram (ECG) (EKG)    Pure hypercholesterolemia    Tobacco use disorder    Unspecified adjustment reaction      Family History  Problem Relation Age of Onset   Other Father        No contact--unknown   Breast cancer Sister    Thyroid disease Daughter    Anxiety disorder Daughter    OCD Daughter    Rectal cancer Neg Hx    Esophageal cancer Neg Hx    Stomach cancer Neg Hx    Colon cancer Neg Hx      Past Surgical History:  Procedure Laterality Date   HAMMER TOE SURGERY Bilateral    PELVIC LAPAROSCOPY     exploratory, thought might have a cyst, not seen   TUBAL LIGATION       Social History   Socioeconomic History   Marital status: Divorced    Spouse name: Not on file   Number of children: 3   Years of education: Not on file   Highest education level: Not on file  Occupational History   Occupation: Company secretary  Tobacco Use   Smoking status: Every Day    Packs/day: 1.50    Years: 45.00    Total pack years: 67.50    Types: Cigarettes    Start date: 1980   Smokeless tobacco: Never   Tobacco comments:    Currently 1/2 ppd 02/28/22 [name], CMA  Vaping Use   Vaping Use: Some days   Substances: Nicotine  Substance and Sexual Activity   Alcohol use: Yes    Alcohol/week: 1.0 standard drink of alcohol    Types: 1 Glasses of wine per week    Comment: occasional wine   Drug use: No    Comment: tried pot once, didn't like it   Sexual activity: Yes    Partners: Male    Birth control/protection: Surgical  Other Topics Concern   Not on file  Social History Narrative   Married--much stress at home with marriage      3 children  Social Determinants of Health   Financial Resource Strain: Not on file  Food Insecurity: Not on file  Transportation Needs: Not on file  Physical Activity: Not on file  Stress: Not on file  Social Connections: Not on file  Intimate Partner Violence: Not on file     Allergies  Allergen Reactions   Benadryl [Diphenhydramine Hcl] Other (See Comments)    Makes patient extremely drowsy       Outpatient Medications Prior to Visit  Medication Sig Dispense Refill   acetaminophen (TYLENOL) 500 MG tablet Take 500 mg by mouth every 6 (six) hours as needed.     albuterol (VENTOLIN HFA) 108 (90 Base) MCG/ACT inhaler Inhale 2 puffs into the lungs every 4 (four) hours as needed for wheezing or shortness of breath. 1 each 0   ANORO ELLIPTA 62.5-25 MCG/ACT AEPB Inhale 1 puff into the lungs daily.     benzonatate (TESSALON) 100 MG capsule Take 1 capsule (100 mg total) by mouth 3 (three) times daily. 60 capsule  1   bismuth subsalicylate (PEPTO BISMOL) 262 MG/15ML suspension Take 15 mLs by mouth every 6 (six) hours as needed for indigestion or diarrhea or loose stools.     cyanocobalamin (VITAMIN B12) 100 MCG tablet Take 100 mcg by mouth daily.     cyclobenzaprine (FLEXERIL) 10 MG tablet Take 0.5-1 tablets (5-10 mg total) by mouth at bedtime as needed for muscle spasms. 30 tablet 1   levothyroxine (SYNTHROID) 112 MCG tablet Take 1 tablet (112 mcg total) by mouth daily. 90 tablet 0   Multiple Vitamin (MULTIVITAMIN WITH MINERALS) TABS tablet Take 1 tablet by mouth daily.     pantoprazole (PROTONIX) 40 MG tablet Take 1 tablet (40 mg total) by mouth daily. 90 tablet 1   predniSONE (DELTASONE) 20 MG tablet 2 tabs po for 7 days, then 1 tab po for 7 days 21 tablet 0   Tiotropium Bromide-Olodaterol (STIOLTO RESPIMAT) 2.5-2.5 MCG/ACT AERS Inhale 2 puffs into the lungs daily. 4 g 11   No facility-administered medications prior to visit.       Objective:   Physical Exam:  General appearance: 61 y.o., female, NAD, conversant, female, NAD, conversant  Eyes: anicteric sclerae; PERRL, tracking appropriately HENT: NCAT; MMM Neck: Trachea midline; no lymphadenopathy, no JVD Lungs: CTAB, no crackles, no wheeze, with normal respiratory effort CV: RRR, no murmur  Abdomen: Soft, non-tender; non-distended, BS present  Extremities: No peripheral edema, warm Skin: Normal turgor and texture; no rash Psych: Appropriate affect Neuro: Alert and oriented to person and place, no focal deficit     There were no vitals filed for this visit.    on RA BMI Readings from Last 3 Encounters:  03/24/22 24.00 kg/m  03/24/22 24.00 kg/m  02/28/22 24.75 kg/m   Wt Readings from Last 3 Encounters:  03/24/22 127 lb (57.6 kg)  03/24/22 127 lb (57.6 kg)  02/28/22 131 lb (59.4 kg)     CBC    Component Value Date/Time   WBC 10.4 02/01/2022 1800   RBC 3.90 02/01/2022 1800   HGB 12.8 02/01/2022 1800   HCT 38.3 02/01/2022 1800   PLT 305  02/01/2022 1800   MCV 98.2 02/01/2022 1800   MCH 32.8 02/01/2022 1800   MCHC 33.4 02/01/2022 1800   RDW 11.9 02/01/2022 1800   LYMPHSABS 1.5 02/01/2022 1800   MONOABS 1.0 02/01/2022 1800   EOSABS 0.0 02/01/2022 1800   BASOSABS 0.0 02/01/2022 1800    Eos 0-200  Chest Imaging: CTA Chest 02/04/22 reviewed by  me with upper lobe predominant emphysema, bronchial wall thickening  Pulmonary Functions Testing Results:     No data to display              Assessment & Plan:   # Emphysema # Suspected COPD Gold B  # Smoking Smoking cessation instruction/counseling given:  counseled patient on the dangers of tobacco use, advised patient to stop smoking, and reviewed strategies to maximize success. Declines chantix, patches. Will try gum OTC.   Plan: - Start stiolto 2 puffs once daily, stop anoro - albuterol 1-2 puffs as needed - you will be called to schedule CT Chest for lung cancer screening - nicotine gum OTC prn - needs to bring copy of spirometry from OSH - see you in 8 weeks or sooner if need be!     Omar Person, MD [facility] 04/24/2022 3:41 PM

## 2022-04-30 ENCOUNTER — Ambulatory Visit: Payer: Managed Care, Other (non HMO) | Admitting: Student

## 2022-05-05 ENCOUNTER — Ambulatory Visit: Payer: Managed Care, Other (non HMO) | Admitting: Orthopedic Surgery

## 2022-05-06 ENCOUNTER — Other Ambulatory Visit: Payer: Self-pay

## 2022-05-06 NOTE — Telephone Encounter (Signed)
Can we please ask pt how she is doing with anoro inhaler since starting?

## 2022-05-12 ENCOUNTER — Encounter: Payer: Self-pay | Admitting: Family

## 2022-05-12 ENCOUNTER — Other Ambulatory Visit: Payer: Self-pay | Admitting: Family

## 2022-05-12 ENCOUNTER — Ambulatory Visit (INDEPENDENT_AMBULATORY_CARE_PROVIDER_SITE_OTHER): Payer: Self-pay | Admitting: Family

## 2022-05-12 VITALS — BP 100/62 | HR 92 | Temp 98.2°F | Resp 16 | Ht 61.0 in | Wt 131.1 lb

## 2022-05-12 DIAGNOSIS — F17200 Nicotine dependence, unspecified, uncomplicated: Secondary | ICD-10-CM

## 2022-05-12 DIAGNOSIS — K219 Gastro-esophageal reflux disease without esophagitis: Secondary | ICD-10-CM

## 2022-05-12 DIAGNOSIS — E039 Hypothyroidism, unspecified: Secondary | ICD-10-CM

## 2022-05-12 DIAGNOSIS — R7303 Prediabetes: Secondary | ICD-10-CM

## 2022-05-12 DIAGNOSIS — I739 Peripheral vascular disease, unspecified: Secondary | ICD-10-CM

## 2022-05-12 DIAGNOSIS — J4489 Other specified chronic obstructive pulmonary disease: Secondary | ICD-10-CM

## 2022-05-12 DIAGNOSIS — R209 Unspecified disturbances of skin sensation: Secondary | ICD-10-CM

## 2022-05-12 DIAGNOSIS — R1013 Epigastric pain: Secondary | ICD-10-CM

## 2022-05-12 LAB — TSH: TSH: 20.55 u[IU]/mL — ABNORMAL HIGH (ref 0.35–5.50)

## 2022-05-12 LAB — HEMOGLOBIN A1C: Hgb A1c MFr Bld: 6.4 % (ref 4.6–6.5)

## 2022-05-12 MED ORDER — LEVOTHYROXINE SODIUM 125 MCG PO TABS: 125.0000 ug | ORAL_TABLET | Freq: Every day | ORAL | 1 refills | Status: DC

## 2022-05-12 MED ORDER — OMEPRAZOLE 40 MG PO CPDR: 40.0000 mg | DELAYED_RELEASE_CAPSULE | Freq: Every day | ORAL | 1 refills | Status: DC

## 2022-05-12 MED ORDER — PANTOPRAZOLE SODIUM 40 MG PO TBEC: 40.0000 mg | DELAYED_RELEASE_TABLET | Freq: Every day | ORAL | 1 refills | Status: DC

## 2022-05-12 NOTE — Assessment & Plan Note (Signed)
Repeat tsh today  Continue levothyroxine 112 mcg once daily  D/w pt how to appropriately take medication without food and or other meds for one hour, seperate from ppi for four hours.

## 2022-05-12 NOTE — Assessment & Plan Note (Signed)
Stop pantoprazole  Start omeprazole 40 mg once daily Will continue on chronic ppi due to schatzi and hiatal hernia Recommended cessation smoking  Try to decrease and or avoid spicy foods, fried fatty foods, and also caffeine and chocolate as these can increase heartburn symptoms.

## 2022-05-12 NOTE — Progress Notes (Signed)
Thyroid much worse, higher.  Start taking it separate from food and or other medication and I will also increase dose to 125 mcg once daily. Sending to pharmacy.   Make lab only appt in four weeks to repeat thyroid.

## 2022-05-12 NOTE — Assessment & Plan Note (Signed)
Smoking cessation instruction/counseling given:  counseled patient on the dangers of tobacco use, advised patient to stop smoking, and reviewed strategies to maximize success 

## 2022-05-12 NOTE — Patient Instructions (Addendum)
  Take your thyroid medication separate from food and or other medication and also four hours from pantoprazole.  Try to decrease and or avoid spicy foods, fried fatty foods, and also caffeine and chocolate as these can increase heartburn symptoms.   A referral was placed today for ankle brachial index (ABI) to test for circulatory issues with both lower legs.  Please let us know if you have not heard back within 2 weeks about the referral.  Call to reschedule appt with pulmonary.   Regards,   [name] FNP-C

## 2022-05-12 NOTE — Assessment & Plan Note (Signed)
Pt missed last pulmonary appt  Advised pt to call back to reschedule Continue stiolto

## 2022-05-12 NOTE — Progress Notes (Signed)
Established Patient Office Visit  Subjective:  Patient ID: Lauren Beck, female    DOB: 1960-09-13  Age: 61 y.o. MRN: [id number]  CC:  Chief Complaint  Patient presents with   Thyroid Problem    HPI Lauren Beck is here today for follow up.   Pt is with acute concerns.  Seen in 02/28/22 for pulmonologist, and started on stiolto 2 puffs daily, advised to stop anoro. Was supposed to f/u with him in November but cancelled. Will call to reschedule.   Hypothyroid: taking 112 mcg levothyroxine. Taking it with her food and also her other medications.  Lab Results  Component Value Date   TSH 10.86 (H) 12/13/2021   Wt Readings from Last 3 Encounters:  05/12/22 131 lb 2 oz (59.5 kg)  03/24/22 127 lb (57.6 kg)  03/24/22 127 lb (57.6 kg)   Hypotension: drinks water, body armour's, throughout the day. Coffee and tea three cups in am.   [name]: pantoprazole 40 mg not working anymore for her however she tried her friends omeprazole 40 mg once daily and it helped much better. Sometimes heartburn will really flare up and wake her up at night. EGD 02/14/22, with gastrititis Schatzki ring and tortuous hiatal hernia. Biopsies were negative.   Past Medical History:  Diagnosis Date   Allergic rhinitis    Arthritis    Bronchitis    COPD (chronic obstructive pulmonary disease) (HCC)    Excessive or frequent menstruation    GERD (gastroesophageal reflux disease)    History of migraines    Hypothyroidism    Nonspecific abnormal electrocardiogram (ECG) (EKG)    Pure hypercholesterolemia    Tobacco use disorder    Unspecified adjustment reaction     Past Surgical History:  Procedure Laterality Date   HAMMER TOE SURGERY Bilateral    PELVIC LAPAROSCOPY     exploratory, thought might have a cyst, not seen   TUBAL LIGATION      Family History  Problem Relation Age of Onset   Other Father        No contact--unknown   Breast cancer Sister    Thyroid disease Daughter    Anxiety disorder  Daughter    OCD Daughter    Rectal cancer Neg Hx    Esophageal cancer Neg Hx    Stomach cancer Neg Hx    Colon cancer Neg Hx     Social History   Socioeconomic History   Marital status: Divorced    Spouse name: Not on file   Number of children: 3   Years of education: Not on file   Highest education level: Not on file  Occupational History   Occupation: Company secretary  Tobacco Use   Smoking status: Every Day    Packs/day: 1.50    Years: 45.00    Total pack years: 67.50    Types: Cigarettes    Start date: 1980   Smokeless tobacco: Never   Tobacco comments:    Currently 1/2 ppd 02/28/22 [name], CMA  Vaping Use   Vaping Use: Some days   Substances: Nicotine  Substance and Sexual Activity   Alcohol use: Yes    Alcohol/week: 1.0 standard drink of alcohol    Types: 1 Glasses of wine per week    Comment: occasional wine   Drug use: No    Comment: tried pot once, didn't like it   Sexual activity: Yes    Partners: Male    Birth control/protection: Surgical  Other Topics Concern  Not on file  Social History Narrative   Married--much stress at home with marriage      3 children         Social Determinants of Health   Financial Resource Strain: Not on file  Food Insecurity: Not on file  Transportation Needs: Not on file  Physical Activity: Not on file  Stress: Not on file  Social Connections: Not on file  Intimate Partner Violence: Not on file    Outpatient Medications Prior to Visit  Medication Sig Dispense Refill   acetaminophen (TYLENOL) 500 MG tablet Take 500 mg by mouth every 6 (six) hours as needed.     albuterol (VENTOLIN HFA) 108 (90 Base) MCG/ACT inhaler Inhale 2 puffs into the lungs every 4 (four) hours as needed for wheezing or shortness of breath. 1 each 0   ANORO ELLIPTA 62.5-25 MCG/ACT AEPB Inhale 1 puff into the lungs daily.     benzonatate (TESSALON) 100 MG capsule Take 1 capsule (100 mg total) by mouth 3 (three) times daily. 60 capsule  1   bismuth subsalicylate (PEPTO BISMOL) 262 MG/15ML suspension Take 15 mLs by mouth every 6 (six) hours as needed for indigestion or diarrhea or loose stools.     cyanocobalamin (VITAMIN B12) 100 MCG tablet Take 100 mcg by mouth daily.     cyclobenzaprine (FLEXERIL) 10 MG tablet Take 0.5-1 tablets (5-10 mg total) by mouth at bedtime as needed for muscle spasms. 30 tablet 1   levothyroxine (SYNTHROID) 112 MCG tablet Take 1 tablet (112 mcg total) by mouth daily. 90 tablet 0   Multiple Vitamin (MULTIVITAMIN WITH MINERALS) TABS tablet Take 1 tablet by mouth daily.     Tiotropium Bromide-Olodaterol (STIOLTO RESPIMAT) 2.5-2.5 MCG/ACT AERS Inhale 2 puffs into the lungs daily. 4 g 11   pantoprazole (PROTONIX) 40 MG tablet Take 1 tablet (40 mg total) by mouth daily. (Patient not taking: Reported on 05/12/2022) 90 tablet 1   predniSONE (DELTASONE) 20 MG tablet 2 tabs po for 7 days, then 1 tab po for 7 days (Patient not taking: Reported on 05/12/2022) 21 tablet 0   No facility-administered medications prior to visit.    Allergies  Allergen Reactions   Benadryl [Diphenhydramine Hcl] Other (See Comments)    Makes patient extremely drowsy            Objective:    Physical Exam Constitutional:      Appearance: Normal appearance.  Cardiovascular:     Rate and Rhythm: Normal rate and regular rhythm.     Pulses:          Dorsalis pedis pulses are 1+ on the right side and 1+ on the left side.       Posterior tibial pulses are 1+ on the right side and 1+ on the left side.     Comments: Coldness to bil toes  Slight redness bil lower toes no other discoloration Pulmonary:     Effort: Pulmonary effort is normal.  Musculoskeletal:     Right lower leg: No edema.     Left lower leg: No edema.  Neurological:     General: No focal deficit present.     Mental Status: She is alert and oriented to person, place, and time. Mental status is at baseline.  Psychiatric:        Mood and Affect: Mood normal.         Behavior: Behavior normal.        Thought Content: Thought content normal.  Judgment: Judgment normal.     BP 100/62   Pulse 92   Temp 98.2 F (36.8 C)   Resp 16   Ht 5\' 1"  (1.549 m)   Wt 131 lb 2 oz (59.5 kg)   LMP 04/06/2012   SpO2 98%   BMI 24.78 kg/m  Wt Readings from Last 3 Encounters:  05/12/22 131 lb 2 oz (59.5 kg)  03/24/22 127 lb (57.6 kg)  03/24/22 127 lb (57.6 kg)     Health Maintenance Due  Topic Date Due   COVID-19 Vaccine (1) Never done   HIV Screening  Never done   Hepatitis C Screening  Never done   PAP SMEAR-Modifier  10/30/2008   MAMMOGRAM  09/04/2011   Zoster Vaccines- Shingrix (1 of 2) Never done    There are no preventive care reminders to display for this patient.  Lab Results  Component Value Date   TSH 10.86 (H) 12/13/2021   Lab Results  Component Value Date   WBC 10.4 02/01/2022   HGB 12.8 02/01/2022   HCT 38.3 02/01/2022   MCV 98.2 02/01/2022   PLT 305 02/01/2022   Lab Results  Component Value Date   NA 136 02/01/2022   K 4.1 02/01/2022   CO2 23 02/01/2022   GLUCOSE 132 (H) 02/01/2022   BUN 15 02/01/2022   CREATININE 0.91 02/01/2022   BILITOT 0.4 11/14/2021   ALKPHOS 93 11/14/2021   AST 38 (H) 11/14/2021   ALT 27 11/14/2021   PROT 6.9 11/14/2021   ALBUMIN 4.4 11/14/2021   CALCIUM 9.3 02/01/2022   ANIONGAP 9 02/01/2022   GFR 53.56 (L) 11/14/2021   Lab Results  Component Value Date   CHOL 279 (H) 11/14/2021   Lab Results  Component Value Date   HDL 67.40 11/14/2021   Lab Results  Component Value Date   LDLCALC 186 (H) 11/14/2021   Lab Results  Component Value Date   TRIG 131.0 11/14/2021   Lab Results  Component Value Date   CHOLHDL 4 11/14/2021   Lab Results  Component Value Date   HGBA1C 6.2 02/04/2022      Assessment & Plan:   Problem List Items Addressed This Visit       Respiratory   COPD (chronic obstructive pulmonary disease) with chronic bronchitis - Primary    Pt missed  last pulmonary appt  Advised pt to call back to reschedule Continue stiolto        Digestive   Gastroesophageal reflux disease    Stop pantoprazole  Start omeprazole 40 mg once daily Will continue on chronic ppi due to schatzi and hiatal hernia Recommended cessation smoking  Try to decrease and or avoid spicy foods, fried fatty foods, and also caffeine and chocolate as these can increase heartburn symptoms.        Relevant Medications   omeprazole (PRILOSEC) 40 MG capsule     Endocrine   Acquired hypothyroidism    Repeat tsh today  Continue levothyroxine 112 mcg once daily  D/w pt how to appropriately take medication without food and or other meds for one hour, seperate from ppi for four hours.        Other   TOBACCO ABUSE    Smoking cessation instruction/counseling given:  counseled patient on the dangers of tobacco use, advised patient to stop smoking, and reviewed strategies to maximize success       Relevant Orders   VAS US ABI WITH/WO TBI   Epigastric pain   Prediabetes  Relevant Orders   Hemoglobin A1c   Intermittent claudication (HCC)   Relevant Orders   VAS [place] ABI WITH/WO TBI   Other Visit Diagnoses     Bilateral cold feet       Relevant Orders   VAS [place] ABI WITH/WO TBI       Meds ordered this encounter  Medications   DISCONTD: pantoprazole (PROTONIX) 40 MG tablet    Sig: Take 1 tablet (40 mg total) by mouth daily.    Dispense:  90 tablet    Refill:  1    Order Specific Question:   Supervising Provider    Answer:   BEDSOLE, AMY E [[id number]]   omeprazole (PRILOSEC) 40 MG capsule    Sig: Take 1 capsule (40 mg total) by mouth daily.    Dispense:  90 capsule    Refill:  1    Order Specific Question:   Supervising Provider    Answer:   Ermalene Searing, AMY E [[id number]]    Follow-up: Return in about 3 months (around 08/12/2022) for f/u thyroid .    [name], FNP

## 2022-05-23 NOTE — Telephone Encounter (Signed)
Left message to return call to our office.  

## 2022-06-06 ENCOUNTER — Telehealth: Payer: Self-pay | Admitting: Family

## 2022-06-06 NOTE — Telephone Encounter (Signed)
Pt called to rescheduled her thyroid labs for Mon, 06/09/22. Pt asked to speak to [facility] or a nurse to discuss her issues with her insurance. Pt stated she lost her job & benefits back in Oct 2023 & she got new insurance but its not effective until the new year. Pt stated she was suppose to have lab work done to continue to get med refills. Pt stated she doesn't have money to pay for visits due to having no insurance. Pt is asking can thyroid still be prescribed although she hasn't done labs? Call back # [phone]

## 2022-06-06 NOTE — Telephone Encounter (Signed)
We need the lab to make sure the medication is effective. It is a lab visit not an office visit. She can contact the billing office and they can offer some assistance or cash pay discount I believe

## 2022-06-09 ENCOUNTER — Other Ambulatory Visit: Payer: Self-pay

## 2022-06-09 NOTE — Telephone Encounter (Signed)
Spoke to pt, pt stated she won't have coverage until January 2024, so she'll wait until then to schedule her appts

## 2022-06-17 ENCOUNTER — Emergency Department (HOSPITAL_BASED_OUTPATIENT_CLINIC_OR_DEPARTMENT_OTHER): Payer: Self-pay | Admitting: Radiology

## 2022-06-17 ENCOUNTER — Emergency Department (HOSPITAL_BASED_OUTPATIENT_CLINIC_OR_DEPARTMENT_OTHER)
Admission: EM | Admit: 2022-06-17 | Discharge: 2022-06-17 | Disposition: A | Discharge status: Home / Self Care | Payer: Self-pay | Attending: Emergency Medicine | Admitting: Emergency Medicine

## 2022-06-17 ENCOUNTER — Encounter (HOSPITAL_BASED_OUTPATIENT_CLINIC_OR_DEPARTMENT_OTHER): Payer: Self-pay

## 2022-06-17 DIAGNOSIS — H6501 Acute serous otitis media, right ear: Secondary | ICD-10-CM | POA: Insufficient documentation

## 2022-06-17 DIAGNOSIS — Z79899 Other long term (current) drug therapy: Secondary | ICD-10-CM | POA: Insufficient documentation

## 2022-06-17 DIAGNOSIS — J0100 Acute maxillary sinusitis, unspecified: Secondary | ICD-10-CM | POA: Insufficient documentation

## 2022-06-17 DIAGNOSIS — Z20822 Contact with and (suspected) exposure to covid-19: Secondary | ICD-10-CM | POA: Insufficient documentation

## 2022-06-17 DIAGNOSIS — J449 Chronic obstructive pulmonary disease, unspecified: Secondary | ICD-10-CM | POA: Insufficient documentation

## 2022-06-17 DIAGNOSIS — E039 Hypothyroidism, unspecified: Secondary | ICD-10-CM | POA: Insufficient documentation

## 2022-06-17 DIAGNOSIS — F17200 Nicotine dependence, unspecified, uncomplicated: Secondary | ICD-10-CM | POA: Insufficient documentation

## 2022-06-17 DIAGNOSIS — J40 Bronchitis, not specified as acute or chronic: Secondary | ICD-10-CM | POA: Insufficient documentation

## 2022-06-17 DIAGNOSIS — Z7951 Long term (current) use of inhaled steroids: Secondary | ICD-10-CM | POA: Insufficient documentation

## 2022-06-17 DIAGNOSIS — Z7952 Long term (current) use of systemic steroids: Secondary | ICD-10-CM | POA: Insufficient documentation

## 2022-06-17 LAB — RESP PANEL BY RT-PCR (RSV, FLU A&B, COVID)  RVPGX2
Influenza A by PCR: NEGATIVE
Influenza B by PCR: NEGATIVE
Resp Syncytial Virus by PCR: NEGATIVE
SARS Coronavirus 2 by RT PCR: NEGATIVE

## 2022-06-17 MED ORDER — PREDNISONE 10 MG PO TABS: 40.0000 mg | ORAL_TABLET | Freq: Every day | ORAL | 0 refills | Status: AC

## 2022-06-17 MED ORDER — PROMETHAZINE-DM 6.25-15 MG/5ML PO SYRP: 5.0000 mL | ORAL_SOLUTION | Freq: Four times a day (QID) | ORAL | 0 refills | Status: DC | PRN

## 2022-06-17 MED ORDER — AMOXICILLIN-POT CLAVULANATE 875-125 MG PO TABS: 1.0000 | ORAL_TABLET | Freq: Two times a day (BID) | ORAL | 0 refills | Status: AC

## 2022-06-17 NOTE — ED Notes (Signed)
Patient transported to X-ray 

## 2022-06-17 NOTE — ED Triage Notes (Signed)
Been having cough for months dry but sometimes productive.  States been having right ear pain which sometimes move to left ear.

## 2022-06-17 NOTE — ED Provider Notes (Signed)
[facility] EMERGENCY DEPT Provider Note   CSN: [id number] Arrival date & time: 06/17/22  0806     History  Chief Complaint  Patient presents with   Cough    Lauren Beck is a 61 y.o. female.  HPI     61 year old female with a history of COPD, hypothyroidism, hyperlipidemia, GERD, smoking who presents with concern for cough.  3 days right ear pain Cough 2-3 months, keeping her up at night, sometimes dry sometimes productive Not any worsening dyspnea Nyquil, mucinex, allergra Using inhaler  No fever, leg swelling or pain.  Past Medical History:  Diagnosis Date   Allergic rhinitis    Arthritis    Bronchitis    COPD (chronic obstructive pulmonary disease) (HCC)    Excessive or frequent menstruation    GERD (gastroesophageal reflux disease)    History of migraines    Hypothyroidism    Nonspecific abnormal electrocardiogram (ECG) (EKG)    Pure hypercholesterolemia    Tobacco use disorder    Unspecified adjustment reaction     Home Medications Prior to Admission medications   Medication Sig Start Date End Date Taking? Authorizing Provider  amoxicillin-clavulanate (AUGMENTIN) 875-125 MG tablet Take 1 tablet by mouth every 12 (twelve) hours for 10 days. 06/17/22 06/27/22 Yes Alvira Monday, MD  predniSONE (DELTASONE) 10 MG tablet Take 4 tablets (40 mg total) by mouth daily for 4 days. 06/17/22 06/21/22 Yes Alvira Monday, MD  promethazine-dextromethorphan (PROMETHAZINE-DM) 6.25-15 MG/5ML syrup Take 5 mLs by mouth 4 (four) times daily as needed for cough. 06/17/22  Yes Alvira Monday, MD  acetaminophen (TYLENOL) 500 MG tablet Take 500 mg by mouth every 6 (six) hours as needed.    [provider]  albuterol (VENTOLIN HFA) 108 (90 Base) MCG/ACT inhaler Inhale 2 puffs into the lungs every 4 (four) hours as needed for wheezing or shortness of breath. 02/01/22   Derwood Kaplan, MD  benzonatate (TESSALON) 100 MG capsule Take 1 capsule (100 mg total)  by mouth 3 (three) times daily. 03/24/22   Copland, Karleen Hampshire, MD  bismuth subsalicylate (PEPTO BISMOL) 262 MG/15ML suspension Take 15 mLs by mouth every 6 (six) hours as needed for indigestion or diarrhea or loose stools.    [provider]  cyanocobalamin (VITAMIN B12) 100 MCG tablet Take 100 mcg by mouth daily.    [provider]  cyclobenzaprine (FLEXERIL) 10 MG tablet Take 0.5-1 tablets (5-10 mg total) by mouth at bedtime as needed for muscle spasms. 03/24/22   Copland, Karleen Hampshire, MD  levothyroxine (SYNTHROID) 125 MCG tablet Take 1 tablet (125 mcg total) by mouth daily. 05/12/22   [name], FNP  Multiple Vitamin (MULTIVITAMIN WITH MINERALS) TABS tablet Take 1 tablet by mouth daily.    [provider]  omeprazole (PRILOSEC) 40 MG capsule Take 1 capsule (40 mg total) by mouth daily. 05/12/22   [name], FNP  Tiotropium Bromide-Olodaterol (STIOLTO RESPIMAT) 2.5-2.5 MCG/ACT AERS Inhale 2 puffs into the lungs daily. 03/03/22   Omar Person, MD      Allergies    Benadryl [diphenhydramine hcl] and Hydrocodone    Review of Systems   Review of Systems  Physical Exam Updated Vital Signs BP 115/71 (BP Location: Right Arm)   Pulse 87   Temp 99.4 F (37.4 C) (Oral)   Resp 18   Ht 5\' 1"  (1.549 m)   Wt 59 kg   LMP 04/06/2012   SpO2 100%   BMI 24.56 kg/m  Physical Exam Vitals and nursing note reviewed.  Constitutional:      General: She is not in acute distress.    Appearance: She is well-developed. She is not diaphoretic.  HENT:     Head: Normocephalic and atraumatic.     Ears:     Comments: Right ear effusion Tenderness maxillary sinuses  Eyes:     Conjunctiva/sclera: Conjunctivae normal.  Cardiovascular:     Rate and Rhythm: Normal rate and regular rhythm.     Heart sounds: Normal heart sounds. No murmur heard.    No friction rub. No gallop.  Pulmonary:     Effort: Pulmonary effort is normal. No respiratory distress.     Breath sounds:  Wheezing (end expiratory, occasional) present. No rales.  Abdominal:     General: There is no distension.     Palpations: Abdomen is soft.     Tenderness: There is no abdominal tenderness. There is no guarding.  Musculoskeletal:        General: No tenderness.     Cervical back: Normal range of motion.  Skin:    General: Skin is warm and dry.     Findings: No erythema or rash.  Neurological:     Mental Status: She is alert and oriented to person, place, and time.     ED Results / Procedures / Treatments   Labs (all labs ordered are listed, but only abnormal results are displayed) Labs Reviewed  RESP PANEL BY RT-PCR (RSV, FLU A&B, COVID)  [id number]    EKG None  Radiology DG Chest 2 View  Result Date: 06/17/2022 CLINICAL DATA:  Nonproductive cough for 3 months.  COPD. EXAM: CHEST - 2 VIEW COMPARISON:  02/01/2022 FINDINGS: The heart size and mediastinal contours are within normal limits. Both lungs are clear. The visualized skeletal structures are unremarkable. IMPRESSION: No active cardiopulmonary disease. Electronically Signed   By: Danae Orleans M.D.   On: 06/17/2022 08:47    Procedures Procedures    Medications Ordered in ED Medications - No data to display  ED Course/ Medical Decision Making/ A&P                            61 year old female with a history of COPD, hypothyroidism, hyperlipidemia, GERD, smoking who presents with concern for cough.  Chest x-ray personally evaluated interpreted by me shows no sign of pneumonia or pneumothorax.  No significant dyspnea, no hypoxia, or increased work of breathing on exam.  She is stable for continued outpatient management.  Suspect likely initial viral etiology of cough and bronchitis, however feel this has exacerbated her COPD.  Given prescription for prednisone to take for 4 days and a dose of 60 mg while in the emergency department.  She has inhalers to use at home.  RSV, COVID and flu testing are negative.  She does have 3  days of ear pain, and sinus tenderness on exam, in the setting of prior congestion with development of pain, suspect bacterial otitis media and sinusitis.  She is given a prescription for Augmentin.  Recommend close follow-up care with her primary care doctor       Final Clinical Impression(s) / ED Diagnoses Final diagnoses:  Right acute serous otitis media, recurrence not specified  Acute non-recurrent maxillary sinusitis  Bronchitis    Rx / DC Orders ED Discharge Orders          Ordered    predniSONE (DELTASONE) 10 MG tablet  Daily        06/17/22  2902    promethazine-dextromethorphan (PROMETHAZINE-DM) 6.25-15 MG/5ML syrup  4 times daily PRN        06/17/22 0905    amoxicillin-clavulanate (AUGMENTIN) 875-125 MG tablet  Every 12 hours        06/17/22 0905              Alvira Monday, MD 06/17/22 2349

## 2022-06-18 ENCOUNTER — Telehealth: Payer: Self-pay

## 2022-06-18 NOTE — Telephone Encounter (Signed)
Transition Care Management Unsuccessful Follow-up Telephone Call  Date of discharge and from where:  06/17/2022 [facility]  Attempts:  1st Attempt  Reason for unsuccessful TCM follow-up call:  Left voice message

## 2022-07-10 ENCOUNTER — Encounter: Payer: Self-pay | Admitting: *Deleted

## 2022-07-24 ENCOUNTER — Encounter: Payer: Self-pay | Admitting: Family

## 2022-07-24 ENCOUNTER — Ambulatory Visit: Payer: 59 | Admitting: Family

## 2022-07-24 ENCOUNTER — Telehealth: Payer: Self-pay

## 2022-07-24 ENCOUNTER — Other Ambulatory Visit: Payer: Self-pay | Admitting: Family

## 2022-07-24 VITALS — BP 122/76 | HR 98 | Temp 98.8°F | Ht 61.5 in | Wt 135.6 lb

## 2022-07-24 DIAGNOSIS — H60311 Diffuse otitis externa, right ear: Secondary | ICD-10-CM | POA: Diagnosis not present

## 2022-07-24 DIAGNOSIS — E039 Hypothyroidism, unspecified: Secondary | ICD-10-CM

## 2022-07-24 DIAGNOSIS — R1013 Epigastric pain: Secondary | ICD-10-CM | POA: Diagnosis not present

## 2022-07-24 DIAGNOSIS — J3089 Other allergic rhinitis: Secondary | ICD-10-CM

## 2022-07-24 DIAGNOSIS — R7303 Prediabetes: Secondary | ICD-10-CM

## 2022-07-24 DIAGNOSIS — K219 Gastro-esophageal reflux disease without esophagitis: Secondary | ICD-10-CM | POA: Diagnosis not present

## 2022-07-24 DIAGNOSIS — J029 Acute pharyngitis, unspecified: Secondary | ICD-10-CM

## 2022-07-24 DIAGNOSIS — J441 Chronic obstructive pulmonary disease with (acute) exacerbation: Secondary | ICD-10-CM | POA: Insufficient documentation

## 2022-07-24 DIAGNOSIS — J020 Streptococcal pharyngitis: Secondary | ICD-10-CM

## 2022-07-24 DIAGNOSIS — F17200 Nicotine dependence, unspecified, uncomplicated: Secondary | ICD-10-CM

## 2022-07-24 LAB — TSH: TSH: 43.94 u[IU]/mL — ABNORMAL HIGH (ref 0.35–5.50)

## 2022-07-24 LAB — POCT RAPID STREP A (OFFICE): Rapid Strep A Screen: POSITIVE — AB

## 2022-07-24 LAB — T4, FREE: Free T4: 0.47 ng/dL — ABNORMAL LOW (ref 0.60–1.60)

## 2022-07-24 MED ORDER — CETIRIZINE HCL 10 MG PO TABS: 10.0000 mg | ORAL_TABLET | Freq: Every day | ORAL | 3 refills | Status: DC

## 2022-07-24 MED ORDER — PANTOPRAZOLE SODIUM 40 MG PO TBEC: 40.0000 mg | DELAYED_RELEASE_TABLET | Freq: Every day | ORAL | 0 refills | Status: DC

## 2022-07-24 MED ORDER — LEVOTHYROXINE SODIUM 125 MCG PO TABS: 125.0000 ug | ORAL_TABLET | Freq: Every day | ORAL | 1 refills | Status: DC

## 2022-07-24 MED ORDER — LEVOTHYROXINE SODIUM 150 MCG PO TABS: 150.0000 ug | ORAL_TABLET | Freq: Every day | ORAL | 1 refills | Status: DC

## 2022-07-24 MED ORDER — PREDNISONE 20 MG PO TABS: ORAL_TABLET | ORAL | 0 refills | Status: DC

## 2022-07-24 MED ORDER — OFLOXACIN 0.3 % OT SOLN: 5.0000 [drp] | Freq: Every day | OTIC | 0 refills | Status: DC

## 2022-07-24 MED ORDER — MONTELUKAST SODIUM 10 MG PO TABS: 10.0000 mg | ORAL_TABLET | Freq: Every day | ORAL | 3 refills | Status: DC

## 2022-07-24 NOTE — Assessment & Plan Note (Signed)
Rx prednisone 20 mg twice daily x five days  Start singulair 10 mg nightly Discussed goal decreased daily use of albuterol Call pulmonary and schedule f/u appt as overdue

## 2022-07-24 NOTE — Assessment & Plan Note (Signed)
Non compliance due to lack of insurance.  Repeat tsh today  Restart levothyroxine

## 2022-07-24 NOTE — Assessment & Plan Note (Addendum)
Strep test positive in office  rx augmentin 875/125 mg po bid x 10 days as over 30 days since last dosage  Ibuprofen/tyelnol prn sore throat/fever Pt told to F/u if no improvement in the next 2-3 days.

## 2022-07-24 NOTE — Assessment & Plan Note (Signed)
Stop omeprazole start pantoprazole 40 mg once daily Referral to GI for ongoing worsening GERD Try to decrease and or avoid spicy foods, fried fatty foods, and also caffeine and chocolate as these can increase heartburn symptoms.

## 2022-07-24 NOTE — Assessment & Plan Note (Signed)
Reviewed recent A1c Pt advised of the following: Work on a diabetic diet, try to incorporate exercise at least 20-30 a day for 3 days a week or more.

## 2022-07-24 NOTE — Assessment & Plan Note (Signed)
Smoking cessation instruction/counseling given:  counseled patient on the dangers of tobacco use, advised patient to stop smoking, and reviewed strategies to maximize success 

## 2022-07-24 NOTE — Patient Instructions (Addendum)
Restart levothyroxine.  Stop by the lab prior to leaving today. I will notify you of your results once received.    ------------------------------------  You were found to be strep positive,  Take antibiotics that have been sent to the pharmacy.  Change your toothbrush after 24 hours on the antibiotics.  Gargle with warm salt water as needed for sore throat.   ------------------------------------  ------------------------------------  Start zyrtec at home nightly. Stop allegra   Need to call pulmonary for f/u appointment to be seen.   Recommend 1000 mcg once daily b12 .   Stop by the lab prior to leaving today. I will notify you of your results once received.    Regards,   Eugenia Pancoast FNP-C

## 2022-07-24 NOTE — Progress Notes (Signed)
Established Patient Office Visit  Subjective:   Patient ID: Lauren Beck, female    DOB: 22-Jul-1960  Age: 62 y.o. MRN: [id number]  CC:  Chief Complaint  Patient presents with   Cough    With ears clogged and sore throat for 1 month.    HPI: Lauren Beck is a 62 y.o. female presenting on 07/24/2022 for Cough (With ears clogged and sore throat for 1 month.)     Cough    Since December when she had bronchitis, she states she has had fullness of right ear. Ongoing, not necessarily left ear. She states the ear is crackling and feels like it needs to pop. She was seen in urgent care 12/26 and was diagnosed with right ear infection, CXR was negative for acute findings. Completed augmentin and prednisone.   In the last three weeks has noted a sore throat as well. Slight chest congestion, productive cough with brown sputum. Not noticed any bloody nose. She uses albuterol inhaler daily, often.   COPD/emphysema: still smoking. Worsening sob. Need to f/u with pulmonary.   Hypothyroid: has been out of thyroid medication for two months as ran out of medication as she did not have insurance to come in for lab work and or appt. She now has insurance again.    ROS: Negative unless specifically indicated above in HPI.   Relevant past medical history reviewed and updated as indicated.   Allergies and medications reviewed and updated.   Current Outpatient Medications:    acetaminophen (TYLENOL) 500 MG tablet, Take 500 mg by mouth every 6 (six) hours as needed., Disp: , Rfl:    albuterol (VENTOLIN HFA) 108 (90 Base) MCG/ACT inhaler, Inhale 2 puffs into the lungs every 4 (four) hours as needed for wheezing or shortness of breath., Disp: 1 each, Rfl: 0   bismuth subsalicylate (PEPTO BISMOL) 262 MG/15ML suspension, Take 15 mLs by mouth every 6 (six) hours as needed for indigestion or diarrhea or loose stools., Disp: , Rfl:    cetirizine (ZYRTEC) 10 MG tablet, Take 1 tablet (10 mg total) by mouth  daily., Disp: 90 tablet, Rfl: 3   cyanocobalamin (VITAMIN B12) 100 MCG tablet, Take 100 mcg by mouth daily., Disp: , Rfl:    cyclobenzaprine (FLEXERIL) 10 MG tablet, Take 0.5-1 tablets (5-10 mg total) by mouth at bedtime as needed for muscle spasms., Disp: 30 tablet, Rfl: 1   montelukast (SINGULAIR) 10 MG tablet, Take 1 tablet (10 mg total) by mouth at bedtime., Disp: 30 tablet, Rfl: 3   Multiple Vitamin (MULTIVITAMIN WITH MINERALS) TABS tablet, Take 1 tablet by mouth daily., Disp: , Rfl:    ofloxacin (FLOXIN) 0.3 % OTIC solution, Place 5 drops into the right ear daily for 10 days., Disp: 2.5 mL, Rfl: 0   pantoprazole (PROTONIX) 40 MG tablet, Take 1 tablet (40 mg total) by mouth daily., Disp: 90 tablet, Rfl: 0   predniSONE (DELTASONE) 20 MG tablet, Take two tablets once daily for five days, Disp: 10 tablet, Rfl: 0   Tiotropium Bromide-Olodaterol (STIOLTO RESPIMAT) 2.5-2.5 MCG/ACT AERS, Inhale 2 puffs into the lungs daily., Disp: 4 g, Rfl: 11   levothyroxine (SYNTHROID) 125 MCG tablet, Take 1 tablet (125 mcg total) by mouth daily., Disp: 30 tablet, Rfl: 1  Allergies  Allergen Reactions   Benadryl [Diphenhydramine Hcl] Other (See Comments)    Makes patient extremely drowsy     Hydrocodone     States has headaches from such    Objective:   BP 122/76  Pulse 98   Temp 98.8 F (37.1 C) (Temporal)   Ht 5' 1.5" (1.562 m)   Wt 135 lb 9.6 oz (61.5 kg)   LMP 04/06/2012   SpO2 98%   BMI 25.21 kg/m    Physical Exam Constitutional:      General: She is not in acute distress.    Appearance: Normal appearance. She is normal weight. She is not ill-appearing, toxic-appearing or diaphoretic.  HENT:     Head: Normocephalic.     Right Ear: Tympanic membrane normal. Drainage (yellow thick discharge inner canal with erythema mild) and tenderness present. There is no impacted cerumen. Tympanic membrane is not bulging.     Left Ear: Hearing, tympanic membrane, ear canal and external ear normal.  No  middle ear effusion.     Nose: Mucosal edema and congestion present. No nasal tenderness.     Right Sinus: No maxillary sinus tenderness or frontal sinus tenderness.     Left Sinus: No maxillary sinus tenderness or frontal sinus tenderness.     Mouth/Throat:     Mouth: Mucous membranes are dry.     Pharynx: No oropharyngeal exudate or posterior oropharyngeal erythema.  Eyes:     Extraocular Movements: Extraocular movements intact.     Pupils: Pupils are equal, round, and reactive to light.  Cardiovascular:     Rate and Rhythm: Normal rate and regular rhythm.     Pulses: Normal pulses.     Heart sounds: Normal heart sounds.  Pulmonary:     Effort: Pulmonary effort is normal.     Breath sounds: Normal breath sounds. No decreased air movement or transmitted upper airway sounds. No wheezing.  Musculoskeletal:     Cervical back: Normal range of motion.  Neurological:     General: No focal deficit present.     Mental Status: She is alert and oriented to person, place, and time. Mental status is at baseline.  Psychiatric:        Mood and Affect: Mood normal.        Behavior: Behavior normal.        Thought Content: Thought content normal.        Judgment: Judgment normal.     Assessment & Plan:  Epigastric pain -     Pantoprazole Sodium; Take 1 tablet (40 mg total) by mouth daily.  Dispense: 90 tablet; Refill: 0 -     Ambulatory referral to Gastroenterology  Acquired hypothyroidism Assessment & Plan: Non compliance due to lack of insurance.  Repeat tsh today  Restart levothyroxine   Orders: -     Levothyroxine Sodium; Take 1 tablet (125 mcg total) by mouth daily.  Dispense: 30 tablet; Refill: 1 -     TSH -     T4, free -     TSH; Future  Gastroesophageal reflux disease, unspecified whether esophagitis present Assessment & Plan: Stop omeprazole start pantoprazole 40 mg once daily Referral to GI for ongoing worsening GERD Try to decrease and or avoid spicy foods, fried fatty  foods, and also caffeine and chocolate as these can increase heartburn symptoms.    Orders: -     Pantoprazole Sodium; Take 1 tablet (40 mg total) by mouth daily.  Dispense: 90 tablet; Refill: 0 -     Ambulatory referral to Gastroenterology  Acute diffuse otitis externa of right ear -     Ofloxacin; Place 5 drops into the right ear daily for 10 days.  Dispense: 2.5 mL; Refill: 0  Sore throat -  POCT rapid strep A  COPD with exacerbation ([place]) Assessment & Plan: Rx prednisone 20 mg twice daily x five days  Start singulair 10 mg nightly Discussed goal decreased daily use of albuterol Call pulmonary and schedule f/u appt as overdue    Orders: -     predniSONE; Take two tablets once daily for five days  Dispense: 10 tablet; Refill: 0 -     Montelukast Sodium; Take 1 tablet (10 mg total) by mouth at bedtime.  Dispense: 30 tablet; Refill: 3  Non-seasonal allergic rhinitis, unspecified trigger Assessment & Plan: Stop allegra  Start zyrtec nightly  Rx sent to pharmacy   Orders: -     Cetirizine HCl; Take 1 tablet (10 mg total) by mouth daily.  Dispense: 90 tablet; Refill: 3  TOBACCO ABUSE Assessment & Plan: Smoking cessation instruction/counseling given:  counseled patient on the dangers of tobacco use, advised patient to stop smoking, and reviewed strategies to maximize success     Prediabetes Assessment & Plan: Reviewed recent A1c Pt advised of the following: Work on a diabetic diet, try to incorporate exercise at least 20-30 a day for 3 days a week or more.     Strep pharyngitis Assessment & Plan: Strep test positive in office  rx augmentin 875/125 mg po bid x 10 days as over 30 days since last dosage  Ibuprofen/tyelnol prn sore throat/fever Pt told to F/u if no improvement in the next 2-3 days.        Follow up plan: Return in about 3 months (around 10/22/2022), or if symptoms worsen or fail to improve, for f/u prediabetes.  Eugenia Pancoast, FNP

## 2022-07-24 NOTE — Assessment & Plan Note (Signed)
Stop allegra  Start zyrtec nightly  Rx sent to pharmacy

## 2022-07-24 NOTE — Progress Notes (Signed)
Please let pt know that her thyroid levels are in the 40's so it is no wonder she feels 'off' . I am actually going to have her restart the levothyroxine at an increased dose of 150 mcg once daily. Make sure that she knows to repeat tsh in four weeks lab only appt.

## 2022-07-24 NOTE — Progress Notes (Signed)
Please call and let pt know she is strep positive. Sending in antibiotic.   Advise her to  Change your toothbrush after 24 hours on the antibiotics.  Gargle with warm salt water as needed for sore throat.

## 2022-07-24 NOTE — Telephone Encounter (Signed)
Pt said she just found out she has strep throat and pt is waiting for pharmacy to get her meds ready for pick up and pt wonders what she can drink to help throat pain. Advised pt to keep throat moist with water or warm decaf tea with honey and lemon, and to gargle with warm salt water. Pt voiced understanding and pt said nothing further needed. Will send note to T Dugal FNP if any thing else to advise pt about what to drink for strep throat comfort.

## 2022-07-25 ENCOUNTER — Other Ambulatory Visit: Payer: Self-pay

## 2022-07-25 ENCOUNTER — Telehealth: Payer: Self-pay | Admitting: Family

## 2022-07-25 DIAGNOSIS — R1013 Epigastric pain: Secondary | ICD-10-CM

## 2022-07-25 DIAGNOSIS — J441 Chronic obstructive pulmonary disease with (acute) exacerbation: Secondary | ICD-10-CM

## 2022-07-25 DIAGNOSIS — H60311 Diffuse otitis externa, right ear: Secondary | ICD-10-CM

## 2022-07-25 DIAGNOSIS — K219 Gastro-esophageal reflux disease without esophagitis: Secondary | ICD-10-CM

## 2022-07-25 DIAGNOSIS — J3089 Other allergic rhinitis: Secondary | ICD-10-CM

## 2022-07-25 MED ORDER — PREDNISONE 20 MG PO TABS: ORAL_TABLET | ORAL | 0 refills | Status: DC

## 2022-07-25 MED ORDER — OFLOXACIN 0.3 % OT SOLN: 5.0000 [drp] | Freq: Every day | OTIC | 0 refills | Status: AC

## 2022-07-25 MED ORDER — PANTOPRAZOLE SODIUM 40 MG PO TBEC: 40.0000 mg | DELAYED_RELEASE_TABLET | Freq: Every day | ORAL | 0 refills | Status: DC

## 2022-07-25 MED ORDER — CETIRIZINE HCL 10 MG PO TABS: 10.0000 mg | ORAL_TABLET | Freq: Every day | ORAL | 3 refills | Status: DC

## 2022-07-25 MED ORDER — MONTELUKAST SODIUM 10 MG PO TABS: 10.0000 mg | ORAL_TABLET | Freq: Every day | ORAL | 3 refills | Status: DC

## 2022-07-25 NOTE — Telephone Encounter (Signed)
Called [facility] pharmacy and verified that they have received the rx. I called the patient and advised the rx's was received at the pharmacy and they are working on getting them filled.

## 2022-07-25 NOTE — Telephone Encounter (Signed)
Patient called and stated her insurance won't pay for it unless  [id number] - [place] [address], [place] Phone: [phone]  Fax: [phone]     Take over all her medications. Call back number [phone].

## 2022-07-25 NOTE — Telephone Encounter (Signed)
Spoke with the patient and advised that I sent the medication to [facility] on [address]. I will contact the pharmacy at 3:45pm to confirm if they received the medication and will call her back.

## 2022-07-25 NOTE — Telephone Encounter (Signed)
Noted. Great recommendations thank you.

## 2022-08-11 ENCOUNTER — Ambulatory Visit (INDEPENDENT_AMBULATORY_CARE_PROVIDER_SITE_OTHER): Payer: 59 | Admitting: Family

## 2022-08-11 ENCOUNTER — Encounter: Payer: Self-pay | Admitting: Family

## 2022-08-11 ENCOUNTER — Other Ambulatory Visit: Payer: Self-pay

## 2022-08-11 ENCOUNTER — Telehealth: Payer: Self-pay | Admitting: Family

## 2022-08-11 VITALS — BP 112/74 | HR 100 | Temp 97.7°F | Ht 61.5 in | Wt 138.8 lb

## 2022-08-11 DIAGNOSIS — E039 Hypothyroidism, unspecified: Secondary | ICD-10-CM | POA: Diagnosis not present

## 2022-08-11 DIAGNOSIS — H60311 Diffuse otitis externa, right ear: Secondary | ICD-10-CM | POA: Diagnosis not present

## 2022-08-11 DIAGNOSIS — J0121 Acute recurrent ethmoidal sinusitis: Secondary | ICD-10-CM

## 2022-08-11 DIAGNOSIS — J3089 Other allergic rhinitis: Secondary | ICD-10-CM | POA: Diagnosis not present

## 2022-08-11 DIAGNOSIS — R0609 Other forms of dyspnea: Secondary | ICD-10-CM

## 2022-08-11 DIAGNOSIS — J441 Chronic obstructive pulmonary disease with (acute) exacerbation: Secondary | ICD-10-CM

## 2022-08-11 MED ORDER — FLUTICASONE PROPIONATE 50 MCG/ACT NA SUSP: 2.0000 | Freq: Every day | NASAL | 6 refills | Status: DC

## 2022-08-11 MED ORDER — LEVOFLOXACIN 750 MG PO TABS: 750.0000 mg | ORAL_TABLET | Freq: Every day | ORAL | 0 refills | Status: DC

## 2022-08-11 NOTE — Assessment & Plan Note (Signed)
Worsening since last lab draw.  Increased levothyroxine last lab draw, awaiting repeat tsh in a few weeks.  Goal 1-2 with TSH

## 2022-08-11 NOTE — Telephone Encounter (Signed)
Patient called the front requesting details on her cardiologist number. She states the number she got from the office was for her husband's doctor. I left a message for her to call the office back.

## 2022-08-11 NOTE — Telephone Encounter (Signed)
Pt returning call would like a call back provided number # [phone]

## 2022-08-11 NOTE — Telephone Encounter (Signed)
Err

## 2022-08-11 NOTE — Assessment & Plan Note (Signed)
Not improving.  Did advise pt to f/u with pulmonary may be due to COPD  Not going to prescribe prednisone as giving her levaquin today however could consider taper of prednisone once completed if necessary. Overusing albuterol as well.  CXR 12/23 negative for acute findings. Suspected allergy contribution as well as well as worsening GERD.  Pt to f/u with GI as wel.l And advised to start flonase

## 2022-08-11 NOTE — Patient Instructions (Addendum)
  Call your GI doctor Dr. Ardis Hughs, for ongoing heartburn and management of heartburn and chronic use of PPI.   Separate your thyroid medication at least 4 hours form vitamins and acid medication.  Have to also separate it from other medications other than those above by at least 1 hour.   Start flonase  Continue zyrtec 10 mg as well as singulair 10 mg.   Call pulmonologist to schedule follow up as well.  [phone] (Home)   Regards,   Eugenia Pancoast FNP-C

## 2022-08-11 NOTE — Assessment & Plan Note (Signed)
Not improving also with sinusitis. Have d/w pt starting levaquin 750 mg once daily for 7 days.  Did d/w pt side effects associated with levaquin including black box warning tendinitis.

## 2022-08-11 NOTE — Progress Notes (Signed)
Established Patient Office Visit  Subjective:   Patient ID: Lauren Beck, female    DOB: 09/25/1960  Age: 62 y.o. MRN: [id number]  CC:  Chief Complaint  Patient presents with   Sinus Problem   Cough    HPI: [name] is a 62 y.o. female presenting on 08/11/2022 for Sinus Problem and Cough   Sinus Problem Associated symptoms include coughing.  Cough    Ongoing chest congestion,  nonproductive cough, nasal congestion ongoing, and sinus pressure. Her nose around her eyes is painful, worse with wearing glasses. She is not taking flonase, but does take zyrtec and also singulair.   She did get a mild improvement with cough and chest congestion with prednisone, however has since returned. On siolto inhaler, she is having to use her albuterol inhaler daily.   She is overdue with pulmonologist, has not called to schedule follow up appointment.   CXR 06/17/22 with no active cardiopulmonary disease.   Epigastric pain, heartburn: overdue for GI appointment. Change from pantoprazole to omeprazole without improvement.   Wt Readings from Last 3 Encounters:  08/11/22 138 lb 12.8 oz (63 kg)  07/24/22 135 lb 9.6 oz (61.5 kg)  06/17/22 130 lb (59 kg)    ROS: Negative unless specifically indicated above in HPI.   Relevant past medical history reviewed and updated as indicated.   Allergies and medications reviewed and updated.   Current Outpatient Medications:    acetaminophen (TYLENOL) 500 MG tablet, Take 500 mg by mouth every 6 (six) hours as needed., Disp: , Rfl:    albuterol (VENTOLIN HFA) 108 (90 Base) MCG/ACT inhaler, Inhale 2 puffs into the lungs every 4 (four) hours as needed for wheezing or shortness of breath., Disp: 1 each, Rfl: 0   bismuth subsalicylate (PEPTO BISMOL) 262 MG/15ML suspension, Take 15 mLs by mouth every 6 (six) hours as needed for indigestion or diarrhea or loose stools., Disp: , Rfl:    cetirizine (ZYRTEC) 10 MG tablet, Take 1 tablet (10 mg total) by mouth  daily., Disp: 90 tablet, Rfl: 3   cyanocobalamin (VITAMIN B12) 100 MCG tablet, Take 100 mcg by mouth daily., Disp: , Rfl:    fluticasone (FLONASE) 50 MCG/ACT nasal spray, Place 2 sprays into both nostrils daily., Disp: 16 g, Rfl: 6   levofloxacin (LEVAQUIN) 750 MG tablet, Take 1 tablet (750 mg total) by mouth daily for 7 days., Disp: 7 tablet, Rfl: 0   levothyroxine (SYNTHROID) 150 MCG tablet, Take 1 tablet (150 mcg total) by mouth daily., Disp: 30 tablet, Rfl: 1   montelukast (SINGULAIR) 10 MG tablet, Take 1 tablet (10 mg total) by mouth at bedtime., Disp: 30 tablet, Rfl: 3   Multiple Vitamin (MULTIVITAMIN WITH MINERALS) TABS tablet, Take 1 tablet by mouth daily., Disp: , Rfl:    pantoprazole (PROTONIX) 40 MG tablet, Take 1 tablet (40 mg total) by mouth daily., Disp: 90 tablet, Rfl: 0   Tiotropium Bromide-Olodaterol (STIOLTO RESPIMAT) 2.5-2.5 MCG/ACT AERS, Inhale 2 puffs into the lungs daily., Disp: 4 g, Rfl: 11  Allergies  Allergen Reactions   Benadryl [Diphenhydramine Hcl] Other (See Comments)    Makes patient extremely drowsy     Hydrocodone     States has headaches from such    Objective:   BP 112/74   Pulse 100   Temp 97.7 F (36.5 C) (Temporal)   Ht 5' 1.5" (1.562 m)   Wt 138 lb 12.8 oz (63 kg)   LMP 04/06/2012   SpO2 99%   BMI  25.80 kg/m    Physical Exam Constitutional:      General: She is not in acute distress.    Appearance: Normal appearance. She is normal weight. She is not ill-appearing, toxic-appearing or diaphoretic.  HENT:     Head: Normocephalic.     Right Ear: Ear canal and external ear normal. A middle ear effusion is present. Tympanic membrane is erythematous and retracted. Tympanic membrane is not bulging.     Left Ear: Hearing, tympanic membrane, ear canal and external ear normal.     Nose:     Right Turbinates: Swollen.     Left Turbinates: Swollen.     Right Sinus: Maxillary sinus tenderness and frontal sinus tenderness present.     Left Sinus:  Maxillary sinus tenderness and frontal sinus tenderness present.     Mouth/Throat:     Mouth: Mucous membranes are dry.     Pharynx: No oropharyngeal exudate or posterior oropharyngeal erythema.  Eyes:     Extraocular Movements: Extraocular movements intact.     Pupils: Pupils are equal, round, and reactive to light.  Cardiovascular:     Rate and Rhythm: Normal rate and regular rhythm.     Pulses: Normal pulses.     Heart sounds: Normal heart sounds.  Pulmonary:     Effort: Pulmonary effort is normal.     Breath sounds: Normal breath sounds.  Musculoskeletal:     Cervical back: Normal range of motion.  Neurological:     General: No focal deficit present.     Mental Status: She is alert and oriented to person, place, and time. Mental status is at baseline.  Psychiatric:        Mood and Affect: Mood normal.        Behavior: Behavior normal.        Thought Content: Thought content normal.        Judgment: Judgment normal.     Assessment & Plan:  Non-seasonal allergic rhinitis, unspecified trigger -     Fluticasone Propionate; Place 2 sprays into both nostrils daily.  Dispense: 16 g; Refill: 6  Acute recurrent ethmoidal sinusitis -     levoFLOXacin; Take 1 tablet (750 mg total) by mouth daily for 7 days.  Dispense: 7 tablet; Refill: 0  Acquired hypothyroidism Assessment & Plan: Worsening since last lab draw.  Increased levothyroxine last lab draw, awaiting repeat tsh in a few weeks.  Goal 1-2 with TSH   Acute diffuse otitis externa of right ear Assessment & Plan: Not improving also with sinusitis. Have d/w pt starting levaquin 750 mg once daily for 7 days.  Did d/w pt side effects associated with levaquin including black box warning tendinitis.    DOE (dyspnea on exertion) Assessment & Plan: Not improving.  Did advise pt to f/u with pulmonary may be due to COPD  Not going to prescribe prednisone as giving her levaquin today however could consider taper of prednisone  once completed if necessary. Overusing albuterol as well.  CXR 12/23 negative for acute findings. Suspected allergy contribution as well as well as worsening GERD.  Pt to f/u with GI as wel.l And advised to start flonase       Follow up plan: Return if symptoms worsen or fail to improve.  Eugenia Pancoast, FNP

## 2022-08-14 MED ORDER — CETIRIZINE HCL 10 MG PO TABS: 10.0000 mg | ORAL_TABLET | Freq: Every day | ORAL | 3 refills | Status: AC

## 2022-08-14 MED ORDER — MONTELUKAST SODIUM 10 MG PO TABS: 10.0000 mg | ORAL_TABLET | Freq: Every day | ORAL | 3 refills | Status: DC

## 2022-08-14 NOTE — Addendum Note (Signed)
Addended by: Vaughan Browner on: 08/14/2022 02:45 PM   Modules accepted: Orders

## 2022-08-14 NOTE — Telephone Encounter (Signed)
Spoke with the patient and was informed that the Levofloxacin is working. She is still having a little pain on right ankle and inside of her right thigh, but have more energy. Patient states her ears are still plugged, but will go to the Pulmonologist next week then go to an ENT. She needs a refill on Montelukast 68m and [facility] See pended medications.

## 2022-08-14 NOTE — Telephone Encounter (Signed)
Please call [phone]  Medicine: levofloxacin (LEVAQUIN) 750 MG tablet   Side effects: R knee, inner thigh, ankle                      Also is dry mouth a side effect of this medicine?  Patient has an appointment with Pulmonary on Monday at [place], Dr. Walker Shadow (she called him her cardiologist)

## 2022-08-17 NOTE — Progress Notes (Unsigned)
Synopsis: Referred for COPD by Eugenia Pancoast, FNP  Subjective:   PATIENT ID: Lauren Beck GENDER: female DOB: 1961/03/16, MRN: [id number]  No chief complaint on file.  62yF with history of AR, COPD, GERD, hypothyroid, smoking referred for COPD  Dyspnea and left upper chest pain (she thinks it is exertional). Albuterol helps some. She's also got anoro but isn't sure if it's helpful - has been using it for about a month. Last course of prednisone was last month. She isn't sure if it helped. She does have a cough but it's mostly dry. No BLE swelling, orthopnea.   She has never had a stress test or seen a cardiologist  She has no family history of lung disease  She works in [employer]. She is a crusher. 1ppd 30-40y smoker, currently half ppd. Vaping nicotine. No MJ. She has a dog. She is from [place].   Interval HPI  Started on stiolto last visit  Otherwise pertinent review of systems is negative.    Past Medical History:  Diagnosis Date   Allergic rhinitis    Arthritis    Bronchitis    COPD (chronic obstructive pulmonary disease) (HCC)    Excessive or frequent menstruation    GERD (gastroesophageal reflux disease)    History of migraines    Hypothyroidism    Nonspecific abnormal electrocardiogram (ECG) (EKG)    Pure hypercholesterolemia    Tobacco use disorder    Unspecified adjustment reaction      Family History  Problem Relation Age of Onset   Other Father        No contact--unknown   Breast cancer Sister    Thyroid disease Daughter    Anxiety disorder Daughter    OCD Daughter    Rectal cancer Neg Hx    Esophageal cancer Neg Hx    Stomach cancer Neg Hx    Colon cancer Neg Hx      Past Surgical History:  Procedure Laterality Date   HAMMER TOE SURGERY Bilateral    PELVIC LAPAROSCOPY     exploratory, thought might have a cyst, not seen   TUBAL LIGATION      Social History   Socioeconomic History   Marital status: Divorced    Spouse name: Not on file    Number of children: 3   Years of education: Not on file   Highest education level: Not on file  Occupational History   Occupation: Biochemist, clinical  Tobacco Use   Smoking status: Every Day    Packs/day: 1.50    Years: 45.00    Total pack years: 67.50    Types: Cigarettes    Start date: 1980   Smokeless tobacco: Never   Tobacco comments:    Currently 1/2 ppd 02/28/22 [place] Dome, CMA  Vaping Use   Vaping Use: Some days   Substances: Nicotine  Substance and Sexual Activity   Alcohol use: Yes    Alcohol/week: 1.0 standard drink of alcohol    Types: 1 Glasses of wine per week    Comment: occasional wine   Drug use: No    Comment: tried pot once, didn't like it   Sexual activity: Yes    Partners: Male    Birth control/protection: Surgical  Other Topics Concern   Not on file  Social History Narrative   Married--much stress at home with marriage      3 children         Social Determinants of Health   Financial Resource Strain: Not on  file  Food Insecurity: Not on file  Transportation Needs: Not on file  Physical Activity: Not on file  Stress: Not on file  Social Connections: Not on file  Intimate Partner Violence: Not on file     Allergies  Allergen Reactions   Benadryl [Diphenhydramine Hcl] Other (See Comments)    Makes patient extremely drowsy     Hydrocodone     States has headaches from such     Outpatient Medications Prior to Visit  Medication Sig Dispense Refill   acetaminophen (TYLENOL) 500 MG tablet Take 500 mg by mouth every 6 (six) hours as needed.     albuterol (VENTOLIN HFA) 108 (90 Base) MCG/ACT inhaler Inhale 2 puffs into the lungs every 4 (four) hours as needed for wheezing or shortness of breath. 1 each 0   bismuth subsalicylate (PEPTO BISMOL) 262 MG/15ML suspension Take 15 mLs by mouth every 6 (six) hours as needed for indigestion or diarrhea or loose stools.     cetirizine (ZYRTEC) 10 MG tablet Take 1 tablet (10 mg total) by mouth daily. 90  tablet 3   cyanocobalamin (VITAMIN B12) 100 MCG tablet Take 100 mcg by mouth daily.     fluticasone (FLONASE) 50 MCG/ACT nasal spray Place 2 sprays into both nostrils daily. 16 g 6   levofloxacin (LEVAQUIN) 750 MG tablet Take 1 tablet (750 mg total) by mouth daily for 7 days. 7 tablet 0   levothyroxine (SYNTHROID) 150 MCG tablet Take 1 tablet (150 mcg total) by mouth daily. 30 tablet 1   montelukast (SINGULAIR) 10 MG tablet Take 1 tablet (10 mg total) by mouth at bedtime. 30 tablet 3   Multiple Vitamin (MULTIVITAMIN WITH MINERALS) TABS tablet Take 1 tablet by mouth daily.     pantoprazole (PROTONIX) 40 MG tablet Take 1 tablet (40 mg total) by mouth daily. 90 tablet 0   Tiotropium Bromide-Olodaterol (STIOLTO RESPIMAT) 2.5-2.5 MCG/ACT AERS Inhale 2 puffs into the lungs daily. 4 g 11   No facility-administered medications prior to visit.       Objective:   Physical Exam:  General appearance: 62 y.o., female, NAD, conversant  Eyes: anicteric sclerae; PERRL, tracking appropriately HENT: NCAT; MMM Neck: Trachea midline; no lymphadenopathy, no JVD Lungs: CTAB, no crackles, no wheeze, with normal respiratory effort CV: RRR, no murmur  Abdomen: Soft, non-tender; non-distended, BS present  Extremities: No peripheral edema, warm Skin: Normal turgor and texture; no rash Psych: Appropriate affect Neuro: Alert and oriented to person and place, no focal deficit     There were no vitals filed for this visit.    on RA BMI Readings from Last 3 Encounters:  08/11/22 25.80 kg/m  07/24/22 25.21 kg/m  06/17/22 24.56 kg/m   Wt Readings from Last 3 Encounters:  08/11/22 138 lb 12.8 oz (63 kg)  07/24/22 135 lb 9.6 oz (61.5 kg)  06/17/22 130 lb (59 kg)     CBC    Component Value Date/Time   WBC 10.4 02/01/2022 1800   RBC 3.90 02/01/2022 1800   HGB 12.8 02/01/2022 1800   HCT 38.3 02/01/2022 1800   PLT 305 02/01/2022 1800   MCV 98.2 02/01/2022 1800   MCH 32.8 02/01/2022 1800   MCHC  33.4 02/01/2022 1800   RDW 11.9 02/01/2022 1800   LYMPHSABS 1.5 02/01/2022 1800   MONOABS 1.0 02/01/2022 1800   EOSABS 0.0 02/01/2022 1800   BASOSABS 0.0 02/01/2022 1800    Eos 0-200  Chest Imaging: CTA Chest 02/04/22 reviewed by me with  upper lobe predominant emphysema, bronchial wall thickening  Pulmonary Functions Testing Results:     No data to display              Assessment & Plan:   # Emphysema # Suspected COPD Gold B  # Smoking Smoking cessation instruction/counseling given:  counseled patient on the dangers of tobacco use, advised patient to stop smoking, and reviewed strategies to maximize success. Declines chantix, patches. Will try gum OTC.   Plan: - Start stiolto 2 puffs once daily, stop anoro - albuterol 1-2 puffs as needed - you will be called to schedule CT Chest for lung cancer screening - nicotine gum OTC prn - needs to bring copy of spirometry from OSH - see you in 8 weeks or sooner if need be!     Maryjane Hurter, MD [facility] 08/17/2022 1:38 PM

## 2022-08-18 ENCOUNTER — Telehealth: Payer: Self-pay | Admitting: Student

## 2022-08-18 ENCOUNTER — Encounter: Payer: Self-pay | Admitting: Student

## 2022-08-18 ENCOUNTER — Ambulatory Visit (INDEPENDENT_AMBULATORY_CARE_PROVIDER_SITE_OTHER): Payer: 59 | Admitting: Student

## 2022-08-18 VITALS — BP 124/68 | HR 108 | Temp 97.9°F | Ht 61.5 in | Wt 140.0 lb

## 2022-08-18 DIAGNOSIS — R053 Chronic cough: Secondary | ICD-10-CM | POA: Diagnosis not present

## 2022-08-18 DIAGNOSIS — J329 Chronic sinusitis, unspecified: Secondary | ICD-10-CM | POA: Diagnosis not present

## 2022-08-18 DIAGNOSIS — R0600 Dyspnea, unspecified: Secondary | ICD-10-CM | POA: Diagnosis not present

## 2022-08-18 MED ORDER — TRELEGY ELLIPTA 100-62.5-25 MCG/ACT IN AEPB: 1.0000 | INHALATION_SPRAY | Freq: Every day | RESPIRATORY_TRACT | 0 refills | Status: AC

## 2022-08-18 NOTE — Patient Instructions (Signed)
-   will price out inhalers for you - try trelegy 1 puff once daily, rinse mouth and brush teeth/tongue after use - would use neil med neti pot with distilled water or water that's been boiled then cooled, clear nose of crusting then flonase 1 spray each nostril. Ok to continue singulair as well. - albuterol 1-2 puffs as needed - you will be called to schedule CT Chest for lung cancer screening - see you in 8 weeks or sooner if need be!

## 2022-08-18 NOTE — Telephone Encounter (Signed)
Order has been updated & should have been updated by nurse.

## 2022-08-18 NOTE — Telephone Encounter (Signed)
Lauren Beck states they do not do limited CT scans. Lauren Beck phone number is [phone].   Please advise sir

## 2022-08-18 NOTE — Telephone Encounter (Signed)
Lauren Beck states they do not do limited CT scans. Lauren Beck phone number is [phone].

## 2022-08-20 ENCOUNTER — Telehealth: Payer: Self-pay | Admitting: Student

## 2022-08-20 ENCOUNTER — Telehealth: Payer: Self-pay | Admitting: Family

## 2022-08-20 NOTE — Telephone Encounter (Signed)
Patient checking on prior authorization for CT scan. Patient phone number is [phone]. May leave detailed voicemail.

## 2022-08-20 NOTE — Telephone Encounter (Signed)
Pt called stating she was told by her insurance that her MRI on  on Fri, 08/22/22 was denied due to the reasoning of MRI not being detailed enough. Pt states she was instructed by her insurance to have [facility] [place] or nurse to contact them to discuss more so pt can have her MRI done. Pt states the number given to her was [phone] & the rep name is Heath Gold. Call back # [phone]

## 2022-08-21 ENCOUNTER — Other Ambulatory Visit: Payer: Self-pay | Admitting: Family

## 2022-08-21 ENCOUNTER — Other Ambulatory Visit: Payer: Self-pay

## 2022-08-21 ENCOUNTER — Telehealth: Payer: Self-pay | Admitting: Student

## 2022-08-21 ENCOUNTER — Other Ambulatory Visit (INDEPENDENT_AMBULATORY_CARE_PROVIDER_SITE_OTHER): Payer: 59

## 2022-08-21 DIAGNOSIS — E039 Hypothyroidism, unspecified: Secondary | ICD-10-CM

## 2022-08-21 LAB — TSH: TSH: 1.17 u[IU]/mL (ref 0.35–5.50)

## 2022-08-21 MED ORDER — LEVOTHYROXINE SODIUM 150 MCG PO TABS: 150.0000 ug | ORAL_TABLET | Freq: Every day | ORAL | 1 refills | Status: DC

## 2022-08-21 NOTE — Telephone Encounter (Signed)
Left detailed message on pt's VM letting her know prior Josem Kaufmann has been obtained.  Nothing further needed at this time.

## 2022-08-21 NOTE — Telephone Encounter (Signed)
Sinus/temporal bone CT approved [id number], expiration 02/17/23

## 2022-08-21 NOTE — Telephone Encounter (Signed)
Auth documented on order.  Nothing further needed at this time.

## 2022-08-21 NOTE — Telephone Encounter (Signed)
Is this something your dept handles?

## 2022-08-21 NOTE — Progress Notes (Signed)
Three month f/u please/ in may.  Will send in refill.

## 2022-08-21 NOTE — Progress Notes (Signed)
Thyroid levels at goal for now. Continue current dose.

## 2022-08-21 NOTE — Telephone Encounter (Signed)
Did you complete a Peer to Peer on yesterday 08/20/2022?  Per [facility] their notes state that you were scheduled for a P2P on 08/20/22 at 2pm  I am just following up

## 2022-08-21 NOTE — Telephone Encounter (Signed)
Disregard previous message.   This is not an MRI, it is a CT Maxillofacial ordered by Maryjane Hurter, MD  They are completing the Auth.   I am not sure how this message even made it to our office when we were not the ordering Physician to begin with?  Disregard Tabitha.    Sending to the correct office

## 2022-08-22 ENCOUNTER — Ambulatory Visit
Admission: RE | Admit: 2022-08-22 | Discharge: 2022-08-22 | Disposition: A | Discharge status: Home / Self Care | Payer: 59 | Source: Ambulatory Visit | Attending: Student | Admitting: Student

## 2022-08-22 DIAGNOSIS — J329 Chronic sinusitis, unspecified: Secondary | ICD-10-CM

## 2022-08-25 ENCOUNTER — Telehealth: Payer: Self-pay | Admitting: Family

## 2022-08-25 NOTE — Telephone Encounter (Signed)
Patient would like a phone call regarding her MRI results from MRI that was completed on 08/22/2022. [phone]

## 2022-08-26 ENCOUNTER — Telehealth: Payer: Self-pay

## 2022-08-26 NOTE — Telephone Encounter (Signed)
PT calling for MRI results. Pls call @ [phone]

## 2022-08-26 NOTE — Telephone Encounter (Signed)
Patient called in and stated that the pharmacy didn't recevied her refill request for montelukast (SINGULAIR) 10 MG tablet. She stated that she got everything else but this one. She is [facility]. Please advise. Thank you!

## 2022-08-26 NOTE — Telephone Encounter (Signed)
Called and spoke w/ pt she is anxious to get Dr.Meier's impression on her CT scan from 3/1.   Dr.Meier, please advise, thanks!

## 2022-08-26 NOTE — Telephone Encounter (Signed)
Noted  

## 2022-08-26 NOTE — Telephone Encounter (Signed)
Patient called in and was wanting a call in regards to her CT scan she had done on March 1. Please advise. Thank you!

## 2022-08-26 NOTE — Telephone Encounter (Signed)
Called patient left message that she would need to call pulmonology for results we did not order.

## 2022-08-28 NOTE — Telephone Encounter (Signed)
Patient checking on CT scan results. Patient phone number is [phone].

## 2022-08-28 NOTE — Telephone Encounter (Signed)
Called pt and there was no answer- left msg we will call her with results after Dr Verlee Monte reviews  Dr Verlee Monte, pt asking about the results of her sinus ct done 08/22/22  Please advise, thanks!

## 2022-08-28 NOTE — Telephone Encounter (Signed)
Called and left voicemail. Pretty minimal sinusitis/mastoiditis by my read - don't anticipate that surgery would be pursued but ultimately ENT would offer better advice.

## 2022-08-29 ENCOUNTER — Telehealth: Payer: Self-pay | Admitting: Student

## 2022-08-29 NOTE — Telephone Encounter (Signed)
Called and left voicemail for patient to call office back  

## 2022-08-29 NOTE — Telephone Encounter (Signed)
Pt calling back for CT results

## 2022-09-12 ENCOUNTER — Emergency Department (HOSPITAL_COMMUNITY)
Admission: EM | Admit: 2022-09-12 | Discharge: 2022-09-12 | Disposition: A | Discharge status: Home / Self Care | Payer: 59 | Attending: Emergency Medicine | Admitting: Emergency Medicine

## 2022-09-12 ENCOUNTER — Other Ambulatory Visit: Payer: Self-pay

## 2022-09-12 DIAGNOSIS — F17200 Nicotine dependence, unspecified, uncomplicated: Secondary | ICD-10-CM | POA: Diagnosis not present

## 2022-09-12 DIAGNOSIS — Z7951 Long term (current) use of inhaled steroids: Secondary | ICD-10-CM | POA: Insufficient documentation

## 2022-09-12 DIAGNOSIS — J449 Chronic obstructive pulmonary disease, unspecified: Secondary | ICD-10-CM | POA: Insufficient documentation

## 2022-09-12 DIAGNOSIS — M25511 Pain in right shoulder: Secondary | ICD-10-CM | POA: Diagnosis present

## 2022-09-12 DIAGNOSIS — G8929 Other chronic pain: Secondary | ICD-10-CM | POA: Insufficient documentation

## 2022-09-12 MED ORDER — PREDNISONE 20 MG PO TABS: 40.0000 mg | ORAL_TABLET | Freq: Every day | ORAL | 0 refills | Status: AC

## 2022-09-12 MED ORDER — KETOROLAC TROMETHAMINE 15 MG/ML IJ SOLN
15.0000 mg | Freq: Once | INTRAMUSCULAR | Status: AC
  Administered 2022-09-12: 15 mg via INTRAMUSCULAR
  Filled 2022-09-12: qty 1

## 2022-09-12 MED ORDER — DEXAMETHASONE SODIUM PHOSPHATE 10 MG/ML IJ SOLN
10.0000 mg | Freq: Once | INTRAMUSCULAR | Status: AC
  Administered 2022-09-12: 10 mg via INTRAMUSCULAR
  Filled 2022-09-12: qty 1

## 2022-09-12 NOTE — ED Provider Notes (Signed)
[facility] Provider Note   CSN: [id number] Arrival date & time: 09/12/22  0932     History  Chief Complaint  Patient presents with   Shoulder Pain    Lauren Beck is a 62 y.o. female.  With past medical history of tobacco use, GERD, cervical spondylitis with radiculopathy, COPD who presents to the emergency department with shoulder pain.  States she is having right shoulder pain.  States that she woke up this morning noticing sharp pain coming from the right neck and going down the back of her right arm.  She states she thought she slept on her arm wrong but pain continued to worsen and she has had some numbness and tingling in the right arm and hand.  She does have a history of bone spurs in her neck and has had a similar pain before.  She denies traumatic injury to the shoulder or neck.  Denies weakness to her right arm.  Denies neck pain.   Shoulder Pain      Home Medications Prior to Admission medications   Medication Sig Start Date End Date Taking? Authorizing Provider  predniSONE (DELTASONE) 20 MG tablet Take 2 tablets (40 mg total) by mouth daily for 5 days. 09/12/22 09/17/22 Yes Mickie Hillier, PA-C  acetaminophen (TYLENOL) 500 MG tablet Take 500 mg by mouth every 6 (six) hours as needed.    [provider]  albuterol (VENTOLIN HFA) 108 (90 Base) MCG/ACT inhaler Inhale 2 puffs into the lungs every 4 (four) hours as needed for wheezing or shortness of breath. 02/01/22   Varney Biles, MD  bismuth subsalicylate (PEPTO BISMOL) 262 MG/15ML suspension Take 15 mLs by mouth every 6 (six) hours as needed for indigestion or diarrhea or loose stools.    [provider]  cetirizine (ZYRTEC) 10 MG tablet Take 1 tablet (10 mg total) by mouth daily. 08/14/22   Eugenia Pancoast, FNP  cyanocobalamin (VITAMIN B12) 100 MCG tablet Take 100 mcg by mouth daily.    [provider]  fluticasone (FLONASE) 50 MCG/ACT nasal spray  Place 2 sprays into both nostrils daily. 08/11/22   Eugenia Pancoast, FNP  Fluticasone-Umeclidin-Vilant (TRELEGY ELLIPTA) 100-62.5-25 MCG/ACT AEPB Inhale 1 puff into the lungs daily. 08/18/22   Maryjane Hurter, MD  levothyroxine (SYNTHROID) 150 MCG tablet Take 1 tablet (150 mcg total) by mouth daily. 08/21/22   Eugenia Pancoast, FNP  montelukast (SINGULAIR) 10 MG tablet Take 1 tablet (10 mg total) by mouth at bedtime. 08/14/22   Eugenia Pancoast, FNP  Multiple Vitamin (MULTIVITAMIN WITH MINERALS) TABS tablet Take 1 tablet by mouth daily.    [provider]  pantoprazole (PROTONIX) 40 MG tablet Take 1 tablet (40 mg total) by mouth daily. 07/25/22   Eugenia Pancoast, FNP  Tiotropium Bromide-Olodaterol (STIOLTO RESPIMAT) 2.5-2.5 MCG/ACT AERS Inhale 2 puffs into the lungs daily. 03/03/22   Maryjane Hurter, MD      Allergies    Benadryl [diphenhydramine hcl], Hydrocodone, and Levaquin [levofloxacin]    Review of Systems   Review of Systems  Musculoskeletal:  Positive for arthralgias.  All other systems reviewed and are negative.   Physical Exam Updated Vital Signs BP 120/67 (BP Location: Left Arm)   Pulse 99   Temp 97.9 F (36.6 C) (Oral)   Resp 18   Ht 5\' 1"  (1.549 m)   Wt 58 kg   LMP 04/06/2012   SpO2 99%   BMI 24.16 kg/m  Physical Exam Vitals and  nursing note reviewed.  HENT:     Head: Normocephalic and atraumatic.  Eyes:     General: No scleral icterus. Pulmonary:     Effort: Pulmonary effort is normal. No respiratory distress.  Musculoskeletal:     Comments: No swelling, erythema or warmth to the right shoulder, elbow or wrist.  No midline C-spine tenderness to palpation.  She does have tenderness to palpation over the right trapezius radial pulse 2+ sensation intact, compartments are soft.  Skin:    Findings: No rash.  Neurological:     General: No focal deficit present.     Mental Status: She is alert.  Psychiatric:        Mood and Affect: Mood normal.         Behavior: Behavior normal.        Thought Content: Thought content normal.        Judgment: Judgment normal.     ED Results / Procedures / Treatments   Labs (all labs ordered are listed, but only abnormal results are displayed) Labs Reviewed - No data to display  EKG None  Radiology No results found.  Procedures Procedures   Medications Ordered in ED Medications  dexamethasone (DECADRON) injection 10 mg (10 mg Intramuscular Given 09/12/22 1028)  ketorolac (TORADOL) 15 MG/ML injection 15 mg (15 mg Intramuscular Given 09/12/22 1028)    ED Course/ Medical Decision Making/ A&P Clinical Course as of 09/12/22 1234  Fri Sep 12, 2022  1102 Starting to have mild improvement in symptoms. Will reassess again shortly  [LA]    Clinical Course User Index [LA] Mickie Hillier, PA-C    Medical Decision Making Risk Prescription drug management.  Initial Impression and Ddx 62 year old female who presents to the emergency department with shoulder pain. Patient PMH that increases complexity of ED encounter: COPD, cervical spondylitis with radiculopathy, tobacco use, GERD Differential: Fracture, subluxation, arthritis, gout, pseudogout, septic joint, etc.  Interpretation of Diagnostics I independent reviewed and interpreted the labs as followed: Not indicated  - I independently visualized the following imaging with scope of interpretation limited to determining acute life threatening conditions related to emergency care: Not indicated  Patient Reassessment and Ultimate Disposition/Management 62 year old female who presents with right shoulder pain. PE as noted above.  No erythema, warmth, swelling concerning for septic joint. No traumatic injury and doubt fracture or subluxation. Do not feel she would benefit from plain film. Likely low yield. Symptoms are inconsistent with a gout or pseudogout.  Suspect cervical radiculopathy. Treating with decadron, toradol here in ED and will  reassess.  Patient had gradual improvement of symptoms.  She is still having right trapezius pain.  She is not having chest pain or shortness of breath.  Pain is reproducible on movement of the shoulder.  I have very low suspicion for cardiac etiology of her symptoms.  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  Will the started on another week of steroids.  Instructed use NSAIDs, heat, massage.  Return precautions for worsening symptoms.  She verbalized understanding.  The patient has been appropriately medically screened and/or stabilized in the ED. I have low suspicion for any other emergent medical condition which would require further screening, evaluation or treatment in the ED or require inpatient management. At time of discharge the patient is hemodynamically stable and in no acute distress. I have discussed work-up results and diagnosis with patient and answered all questions. Patient is agreeable with discharge plan. We discussed  strict return precautions for returning to the emergency department and they verbalized understanding.     Patient management required discussion with the following services or consulting groups:  None  Complexity of Problems Addressed Chronic illness with exacerbation  Additional Data Reviewed and Analyzed Further history obtained from: Past medical history and medications listed in the EMR, Recent discharge summary, Recent Consult notes, and Care Everywhere  Patient Encounter Risk Assessment Prescriptions  Final Clinical Impression(s) / ED Diagnoses Final diagnoses:  Chronic right shoulder pain    Rx / DC Orders ED Discharge Orders          Ordered    predniSONE (DELTASONE) 20 MG tablet  Daily        09/12/22 1224              Mickie Hillier, PA-C 09/12/22 1234    Carmin Muskrat, MD 09/12/22 1336

## 2022-09-12 NOTE — ED Notes (Signed)
Per pts request, both medications given in left deltoid.

## 2022-09-12 NOTE — ED Notes (Signed)
ED Provider at bedside. 

## 2022-09-12 NOTE — ED Triage Notes (Signed)
Pt reports right shoulder pain x 2 months, worse this morning. Denies injury

## 2022-09-12 NOTE — ED Notes (Signed)
An After Visit Summary was printed and given to the patient. Discharge instructions given and no further questions at this time.  

## 2022-09-12 NOTE — Discharge Instructions (Signed)
You were seen in the ER today for pain in your shoulder. I think this is nerve pain coming from your neck. I am placing you on steroids over the next 5 days. Please take this and ibuprofen on a schedule over the next few days. Additionally, please use heat and massage to loosen the muscles in your shoulder. Please return for worsening symptoms.

## 2022-09-15 ENCOUNTER — Telehealth: Payer: Self-pay

## 2022-09-15 ENCOUNTER — Ambulatory Visit (INDEPENDENT_AMBULATORY_CARE_PROVIDER_SITE_OTHER): Payer: 59 | Admitting: Orthopedic Surgery

## 2022-09-15 ENCOUNTER — Telehealth: Payer: Self-pay | Admitting: Family

## 2022-09-15 DIAGNOSIS — M5412 Radiculopathy, cervical region: Secondary | ICD-10-CM

## 2022-09-15 NOTE — Transitions of Care (Post Inpatient/ED Visit) (Unsigned)
   09/15/2022  Name: Lauren Beck MRN: [id number] DOB: Sep 18, 1960  Today's TOC FU Call Status: Unsuccessful Call (1st Attempt) Date: 09/15/22  Attempted to reach the patient regarding the most recent Inpatient/ED visit.  Follow Up Plan: Additional outreach attempts will be made to reach the patient to complete the Transitions of Care (Post Inpatient/ED visit) call.   Norton Blizzard, [place] ([facility])  [facility] [phone]

## 2022-09-15 NOTE — Addendum Note (Signed)
Addended by: Ileene Rubens on: 09/15/2022 12:12 PM   Modules accepted: Level of Service

## 2022-09-15 NOTE — Progress Notes (Signed)
Orthopedic Spine Surgery Office Note  Assessment: Patient is a 62 y.o. female with neck pain that radiates into bilateral upper extremities.  On the left, it radiates into the left shoulder.  On the right, it radiates into the shoulder lateral arm and into the proximal forearm.  Has responded to cervical injection in the past with satisfactory but temporary relief   Plan: -Patient has tried PT, activity modification, oral steroids, ibuprofen, cervical injection -I explained that some of her symptoms are likely coming from the shoulder she does have some pain at the extremes of range of motion.  However, that would not explain her pain radiating past the elbow on the right side.  I told her I think that the majority of her pain is from cervical radiculopathy -Recommended MRI cervical spine to evaluate for radiculopathy. Her last MRI is over 89.61 years old. She has tried conservative treatments for over six weeks now. Since she has tried all non-operative treatments, we discussed ACDF as a treatment option for her if she is able to quit nicotine-containing products. I covered the risks, benefits, and alternatives of this surgery with her. Afterwards, she wanted to proceed and is going to work on quitting nicotine. I will call her with her MRI results and discuss what levels I would plan to decompress and from there I can work on scheduling if she is still interested.   Patient expressed understanding of the plan and all questions were answered to the patient's satisfaction.   __________________________________________________________________________  History: Patient is a 62 y.o. female who has been previously seen in the office for symptoms consistent with cervical radiculopathy. Since the last visit, her symptoms have gotten worse.  She is now noting pain radiating into her right upper extremity.  She feels it in the right lateral shoulder and lateral arm.  It goes into the proximal forearms.  She is  still having the left-sided pain as well.  That starts in the neck and radiates into her left shoulder and goes over the lateral aspect of the shoulder.  She states her right arm has gone numb in the same distribution as the pain.  That has resolved with time but still occurs intermittently.  No other numbness or paresthesias.  Previous treatments: PT, activity modification, oral steroids, ibuprofen, cervical injection  Physical Exam:  General: no acute distress, appears stated age Neurologic: alert, answering questions appropriately, following commands Respiratory: unlabored breathing on room air, symmetric chest rise Psychiatric: appropriate affect, normal cadence to speech   MSK (spine):  -Strength exam      Left  Right Grip strength                5/5  5/5 Interosseus   5/5   5/5 Wrist extension  5/5  5/5 Wrist flexion   5/5  5/5 Elbow flexion   5/5  5/5 Deltoid    4/5  4/5  EHL    5/5  5/5 TA    5/5  5/5 GSC    5/5  5/5 Knee extension  5/5  5/5 Hip flexion   5/5  5/5  -Sensory exam    Sensation intact to light touch in L3-S1 nerve distributions of bilateral lower extremities  Sensation intact to light touch in C5-T1 nerve distributions of bilateral upper extremities  -Brachioradialis DTR: 2/4 on the left, 2/4 on the right -Biceps DTR: 2/4 on the left, 2/4 on the right -Achilles DTR: 2/4 on the left, 2/4 on the right -Patellar tendon DTR:  2/4 on the left, 2/4 on the right  -Spurling: negative bilaterally -Hoffman sign: negative bilaterally -Clonus: no beats bilaterally -Interosseous wasting: none seen -Grip and release test: negative -Romberg: negative -Gait: normal  -Imbalance with tandem gait: no  Left shoulder exam: pain at extreme of internal and external rotation, no pain through remainder of range of motion, negative drop arm sign, no weakness with job testing, negative belly press, no weakness with external rotation with arm at side Right shoulder exam:  pain at extreme of internal and external rotation, no pain through remainder of range of motion, negative drop arm sign, no weakness with job testing, negative belly press, no weakness with external rotation with arm at side  Tinel's at wrist: negative bilaterally Phalen's at wrist: negative bilaterally Durkan's: negative bilaterally  Tinel's at elbow: negative bilaterally  Imaging: XR of the cervical spine from 03/24/2022 was independently reviewed and interpreted, showing no evidence of instability, no acute osseous abnormality, disc height loss at C4/5 and C5/6   Patient name: Lauren Beck Patient MRN: [id number] Date of visit: 09/15/22

## 2022-09-15 NOTE — Telephone Encounter (Signed)
Patient was told to ask by Dr Laurance Flatten her bone doctor Lawerance Bach about prescribing chantix to help her quit smoking for 6 wks,due to her having surgery.  [id number] [address], [place] [address] [place] Phone: [phone]  Fax: [phone]

## 2022-09-16 NOTE — Telephone Encounter (Signed)
Patient called in to check on the status of this medication being called in for her.

## 2022-09-16 NOTE — Telephone Encounter (Signed)
Patient called in and she is scheduled.

## 2022-09-16 NOTE — Telephone Encounter (Signed)
Noted, thanks!

## 2022-09-16 NOTE — Telephone Encounter (Signed)
Called and left a message (per DPR) to call our office back to schedule an appt via video or in-office.

## 2022-09-16 NOTE — Telephone Encounter (Signed)
Please have pt schedule appt (can be video visit, probably easier) so we can discuss expected chantix regimen and get her started.

## 2022-09-16 NOTE — Transitions of Care (Post Inpatient/ED Visit) (Signed)
   09/16/2022  Name: Lauren Beck MRN: [id number] DOB: 14-Oct-1960  Today's TOC FU Call Status: Today's TOC FU Call Status:: Successful TOC FU Call Competed Unsuccessful Call (1st Attempt) Date: 09/15/22 [facility] FU Call Complete Date: 09/16/22  Transition Care Management Follow-up Telephone Call Date of Discharge: 09/12/22 Discharge Facility: [facility]) Type of Discharge: Emergency Department Reason for ED Visit: Other: (right shoulder pain) How have you been since you were released from the hospital?: Better Any questions or concerns?: No  Items Reviewed: Did you receive and understand the discharge instructions provided?: Yes Medications obtained and verified?: Yes (Medications Reviewed) Any new allergies since your discharge?: No Dietary orders reviewed?: NA Do you have support at home?: Yes  Home Care and Equipment/Supplies: [place] Ordered?: NA Any new equipment or medical supplies ordered?: NA  Functional Questionnaire: Do you need assistance with bathing/showering or dressing?: No Do you need assistance with meal preparation?: No Do you need assistance with eating?: No Do you have difficulty maintaining continence: No Do you need assistance with getting out of bed/getting out of a chair/moving?: No Do you have difficulty managing or taking your medications?: No  Follow up appointments reviewed: PCP Follow-up appointment confirmed?: [facility] Follow-up appointment confirmed?: NA Do you need transportation to your follow-up appointment?: No Do you understand care options if your condition(s) worsen?: Yes-patient verbalized understanding   Norton Blizzard, [place] ([place])  [place] [phone]

## 2022-10-06 ENCOUNTER — Telehealth: Payer: Self-pay | Admitting: Family

## 2022-10-06 ENCOUNTER — Encounter: Payer: Self-pay | Admitting: Family

## 2022-10-06 ENCOUNTER — Ambulatory Visit (INDEPENDENT_AMBULATORY_CARE_PROVIDER_SITE_OTHER): Payer: Self-pay | Admitting: Family

## 2022-10-06 VITALS — BP 108/64 | HR 88 | Temp 97.9°F | Ht 61.0 in | Wt 137.8 lb

## 2022-10-06 DIAGNOSIS — E538 Deficiency of other specified B group vitamins: Secondary | ICD-10-CM

## 2022-10-06 DIAGNOSIS — K219 Gastro-esophageal reflux disease without esophagitis: Secondary | ICD-10-CM

## 2022-10-06 DIAGNOSIS — E039 Hypothyroidism, unspecified: Secondary | ICD-10-CM

## 2022-10-06 DIAGNOSIS — R635 Abnormal weight gain: Secondary | ICD-10-CM | POA: Insufficient documentation

## 2022-10-06 DIAGNOSIS — K2100 Gastro-esophageal reflux disease with esophagitis, without bleeding: Secondary | ICD-10-CM

## 2022-10-06 DIAGNOSIS — R7303 Prediabetes: Secondary | ICD-10-CM

## 2022-10-06 DIAGNOSIS — Z716 Tobacco abuse counseling: Secondary | ICD-10-CM

## 2022-10-06 LAB — VITAMIN B12: Vitamin B-12: 1299 pg/mL — ABNORMAL HIGH (ref 211–911)

## 2022-10-06 LAB — HEMOGLOBIN A1C: Hgb A1c MFr Bld: 6 % (ref 4.6–6.5)

## 2022-10-06 LAB — TSH: TSH: 0.02 u[IU]/mL — ABNORMAL LOW (ref 0.35–5.50)

## 2022-10-06 LAB — T4, FREE: Free T4: 1.64 ng/dL — ABNORMAL HIGH (ref 0.60–1.60)

## 2022-10-06 MED ORDER — DEXLANSOPRAZOLE 60 MG PO CPDR: 60.0000 mg | DELAYED_RELEASE_CAPSULE | Freq: Every day | ORAL | 3 refills | Status: DC

## 2022-10-06 MED ORDER — NICOTINE 21 MG/24HR TD PT24: 21.0000 mg | MEDICATED_PATCH | Freq: Every day | TRANSDERMAL | 1 refills | Status: DC

## 2022-10-06 NOTE — Telephone Encounter (Signed)
Patient called to let us know that her insurance is not covering her nicotine patches  dexlansoprazole (DEXILANT) 60 MG capsule is showing $300 at the pharmacy which is too expensive  Please follow-up at [phone]

## 2022-10-06 NOTE — Assessment & Plan Note (Signed)
Tsh today pending results.

## 2022-10-06 NOTE — Assessment & Plan Note (Signed)
Smoking cessation instruction/counseling given:  counseled patient on the dangers of tobacco use, advised patient to stop smoking, and reviewed strategies to maximize success We are starting on nicotine patch 21 mg QD

## 2022-10-06 NOTE — Patient Instructions (Addendum)
Start nicotine patch.   Stop by the lab prior to leaving today. I will notify you of your results once received.    Regards,   [name] FNP-C

## 2022-10-06 NOTE — Telephone Encounter (Signed)
Can we please try for a PA?

## 2022-10-06 NOTE — Assessment & Plan Note (Signed)
Worsening Trial failure omeprazole pepcid and also pantoprazole.  Try to decrease and or avoid spicy foods, fried fatty foods, and also caffeine and chocolate as these can increase heartburn symptoms.  Trial start dexilant to see if improvement

## 2022-10-06 NOTE — Progress Notes (Signed)
Established Patient Office Visit  Subjective:      CC:  Chief Complaint  Patient presents with   Medical Management of Chronic Issues    HPI: Lauren Beck is a 62 y.o. female presenting on 10/06/2022 for Medical Management of Chronic Issues . Cervical radiculopathy: recent visit with Dr. Christell Constant orthopedist, discussed surgery however he would like for her to be able to quit smoking 6 weeks prior to surgery.   She is here to discuss smoking cessation options.  She has tried the nicotine gum and losenges in the patch without success.  Does smoke over ten cigarettes a day. She is a 45 pack year smoker.   Pending appt with Gi for ongoig acid reflux schedule for 6/5  Pulmonary f/u visit 5/30  ENT visit 5/8   Wt Readings from Last 3 Encounters:  10/06/22 137 lb 12.8 oz (62.5 kg)  09/12/22 127 lb 13.9 oz (58 kg)  08/18/22 140 lb (63.5 kg)  Over the last month has gained ten pounds. She is on her thyroid medication, and is taking this separate from food and or other medications.   GERD: no improvement with pantoprazole we did increase to 40 mg once daily but she states wasn't helping so switched to omeprazole. She does state that she is now taking omeprazole 20 mg 2-3 times daily because she found pantoprazole didn't help her at all. Constant heartburn trying to avoid orange juice and cinnamon which really seem to exacerbate this.    Social history:  Relevant past medical, surgical, family and social history reviewed and updated as indicated. Interim medical history since our last visit reviewed.  Allergies and medications reviewed and updated.  DATA REVIEWED: CHART IN EPIC     ROS: Negative unless specifically indicated above in HPI.    Current Outpatient Medications:    acetaminophen (TYLENOL) 500 MG tablet, Take 500 mg by mouth every 6 (six) hours as needed., Disp: , Rfl:    albuterol (VENTOLIN HFA) 108 (90 Base) MCG/ACT inhaler, Inhale 2 puffs into the lungs every 4  (four) hours as needed for wheezing or shortness of breath., Disp: 1 each, Rfl: 0   bismuth subsalicylate (PEPTO BISMOL) 262 MG/15ML suspension, Take 15 mLs by mouth every 6 (six) hours as needed for indigestion or diarrhea or loose stools., Disp: , Rfl:    cetirizine (ZYRTEC) 10 MG tablet, Take 1 tablet (10 mg total) by mouth daily., Disp: 90 tablet, Rfl: 3   cyanocobalamin (VITAMIN B12) 100 MCG tablet, Take 100 mcg by mouth daily., Disp: , Rfl:    dexlansoprazole (DEXILANT) 60 MG capsule, Take 1 capsule (60 mg total) by mouth daily., Disp: 90 capsule, Rfl: 3   fluticasone (FLONASE) 50 MCG/ACT nasal spray, Place 2 sprays into both nostrils daily., Disp: 16 g, Rfl: 6   Fluticasone-Umeclidin-Vilant (TRELEGY ELLIPTA) 100-62.5-25 MCG/ACT AEPB, Inhale 1 puff into the lungs daily., Disp: 14 each, Rfl: 0   levothyroxine (SYNTHROID) 150 MCG tablet, Take 1 tablet (150 mcg total) by mouth daily., Disp: 90 tablet, Rfl: 1   montelukast (SINGULAIR) 10 MG tablet, Take 1 tablet (10 mg total) by mouth at bedtime., Disp: 30 tablet, Rfl: 3   Multiple Vitamin (MULTIVITAMIN WITH MINERALS) TABS tablet, Take 1 tablet by mouth daily., Disp: , Rfl:    nicotine (NICODERM CQ - DOSED IN MG/24 HOURS) 21 mg/24hr patch, Place 1 patch (21 mg total) onto the skin daily., Disp: 28 patch, Rfl: 1   Tiotropium Bromide-Olodaterol (STIOLTO RESPIMAT) 2.5-2.5 MCG/ACT AERS, Inhale  2 puffs into the lungs daily., Disp: 4 g, Rfl: 11      Objective:    BP 108/64 (BP Location: Left Arm)   Pulse 88   Temp 97.9 F (36.6 C) (Temporal)   Ht  (1.549 m)   Wt 137 lb 12.8 oz (62.5 kg)   LMP 04/06/2012   SpO2 98%   BMI 26.04 kg/m   Wt Readings from Last 3 Encounters:  10/06/22 137 lb 12.8 oz (62.5 kg)  09/12/22 127 lb 13.9 oz (58 kg)  08/18/22 140 lb (63.5 kg)    Physical Exam  Physical Exam Constitutional:      General: not in acute distress.    Appearance: Normal appearance. normal weight. is not ill-appearing,  toxic-appearing or diaphoretic.  Cardiovascular:     Rate and Rhythm: Normal rate.  Pulmonary:     Effort: Pulmonary effort is normal.  Musculoskeletal:        General: Normal range of motion.  Neurological:     General: No focal deficit present.     Mental Status: alert and oriented to person, place, and time. Mental status is at baseline.  Psychiatric:        Mood and Affect: Mood normal.        Behavior: Behavior normal.        Thought Content: Thought content normal.        Judgment: Judgment normal.        Assessment & Plan:  Encounter for smoking cessation counseling Assessment & Plan: Smoking cessation instruction/counseling given:  counseled patient on the dangers of tobacco use, advised patient to stop smoking, and reviewed strategies to maximize success We are starting on nicotine patch 21 mg QD   Orders: -     Nicotine; Place 1 patch (21 mg total) onto the skin daily.  Dispense: 28 patch; Refill: 1  Abnormal weight gain -     TSH  Acquired hypothyroidism Assessment & Plan: Tsh today pending results.   Orders: -     TSH  Gastroesophageal reflux disease, unspecified whether esophagitis present Assessment & Plan: Worsening Trial failure omeprazole pepcid and also pantoprazole.  Try to decrease and or avoid spicy foods, fried fatty foods, and also caffeine and chocolate as these can increase heartburn symptoms.  Trial start dexilant to see if improvement  Orders: -     Dexlansoprazole; Take 1 capsule (60 mg total) by mouth daily.  Dispense: 90 capsule; Refill: 3 -     T4, free  Prediabetes -     Hemoglobin A1c  Gastroesophageal reflux disease with esophagitis without hemorrhage Assessment & Plan: Worsening Trial failure omeprazole pepcid and also pantoprazole.  Try to decrease and or avoid spicy foods, fried fatty foods, and also caffeine and chocolate as these can increase heartburn symptoms.  Trial start dexilant to see if improvement  Orders: -      Dexlansoprazole; Take 1 capsule (60 mg total) by mouth daily.  Dispense: 90 capsule; Refill: 3  Low serum vitamin B12 -     Vitamin B12     Return for as scheduled in two months .  [name], MSN, APRN, FNP-C [facility]

## 2022-10-08 NOTE — Telephone Encounter (Signed)
Pt called in requesting a call back regarding lab results has questions about medication . Please advise [phone]

## 2022-10-08 NOTE — Telephone Encounter (Signed)
Called and spoke with pt letting her know the results of the CT and she verbalized understanding. Nothing further needed. 

## 2022-10-08 NOTE — Progress Notes (Signed)
Thyroid is now oversuppressed which means too much hormone  Let's decrease thyroid medication to one tablet every six days, don't take on day seven. Lab only to repeat TSH in one month. B12 too high. Confirm dose, and have her decrease.  If 1000 mcg once daily decrease to every other day for ex

## 2022-10-08 NOTE — Telephone Encounter (Signed)
Status of PA.

## 2022-10-09 ENCOUNTER — Other Ambulatory Visit (HOSPITAL_COMMUNITY): Payer: Self-pay

## 2022-10-09 ENCOUNTER — Encounter: Payer: Self-pay | Admitting: Orthopedic Surgery

## 2022-10-09 NOTE — Telephone Encounter (Signed)
Ran test claim with the insurance card on charts. Came back coverage terminated. Searched for eligibility, no results were found. Please advise.

## 2022-10-13 ENCOUNTER — Other Ambulatory Visit: Payer: 59

## 2022-10-14 NOTE — Progress Notes (Signed)
Please send letter.

## 2022-10-14 NOTE — Telephone Encounter (Signed)
Left message to return call to our office.  

## 2022-10-27 ENCOUNTER — Ambulatory Visit
Admission: RE | Admit: 2022-10-27 | Discharge: 2022-10-27 | Disposition: A | Discharge status: Home / Self Care | Payer: 59 | Source: Ambulatory Visit | Attending: Orthopedic Surgery | Admitting: Orthopedic Surgery

## 2022-10-27 DIAGNOSIS — M5412 Radiculopathy, cervical region: Secondary | ICD-10-CM

## 2022-10-28 ENCOUNTER — Telehealth: Payer: Self-pay | Admitting: Student

## 2022-10-28 ENCOUNTER — Telehealth: Payer: Self-pay | Admitting: Orthopedic Surgery

## 2022-10-28 NOTE — Telephone Encounter (Signed)
I called and advised that we have not rec'd the report yet but that we will discuss her results on Friday at her appt

## 2022-10-28 NOTE — Telephone Encounter (Signed)
Patient advising  she had MRI yesterday and asking for review

## 2022-10-28 NOTE — Telephone Encounter (Signed)
Patient states [facility] has not been able to schedule appointment. Not able to reach office. Patient phone number is [phone].

## 2022-10-29 NOTE — Telephone Encounter (Signed)
I called [facility] and spoke with [facility] and they stated that it doesn't look like they got the referral back in February. The patient was scheduled to be seen today but she CXL the appt. Brittney also gave me an updated fax # [phone]. If the office could refax the referral. I would be I can never get the fax to go through from [place]. I called the patient and had to leave a message that according to [facility] she had an appt 10/29/22 and she CXL the appt

## 2022-10-30 ENCOUNTER — Encounter: Payer: Self-pay | Admitting: Family

## 2022-10-30 ENCOUNTER — Ambulatory Visit (INDEPENDENT_AMBULATORY_CARE_PROVIDER_SITE_OTHER): Payer: 59 | Admitting: Family

## 2022-10-30 ENCOUNTER — Other Ambulatory Visit: Payer: Self-pay | Admitting: Family

## 2022-10-30 VITALS — BP 122/72 | HR 82 | Temp 98.1°F | Ht 61.0 in | Wt 137.2 lb

## 2022-10-30 DIAGNOSIS — Z2821 Immunization not carried out because of patient refusal: Secondary | ICD-10-CM | POA: Insufficient documentation

## 2022-10-30 DIAGNOSIS — Z0001 Encounter for general adult medical examination with abnormal findings: Secondary | ICD-10-CM

## 2022-10-30 DIAGNOSIS — Z114 Encounter for screening for human immunodeficiency virus [HIV]: Secondary | ICD-10-CM

## 2022-10-30 DIAGNOSIS — R7989 Other specified abnormal findings of blood chemistry: Secondary | ICD-10-CM

## 2022-10-30 DIAGNOSIS — Z1231 Encounter for screening mammogram for malignant neoplasm of breast: Secondary | ICD-10-CM

## 2022-10-30 DIAGNOSIS — Z780 Asymptomatic menopausal state: Secondary | ICD-10-CM

## 2022-10-30 DIAGNOSIS — K219 Gastro-esophageal reflux disease without esophagitis: Secondary | ICD-10-CM

## 2022-10-30 DIAGNOSIS — J441 Chronic obstructive pulmonary disease with (acute) exacerbation: Secondary | ICD-10-CM

## 2022-10-30 DIAGNOSIS — E782 Mixed hyperlipidemia: Secondary | ICD-10-CM

## 2022-10-30 DIAGNOSIS — E039 Hypothyroidism, unspecified: Secondary | ICD-10-CM

## 2022-10-30 DIAGNOSIS — Z1159 Encounter for screening for other viral diseases: Secondary | ICD-10-CM

## 2022-10-30 DIAGNOSIS — J0121 Acute recurrent ethmoidal sinusitis: Secondary | ICD-10-CM

## 2022-10-30 DIAGNOSIS — F17200 Nicotine dependence, unspecified, uncomplicated: Secondary | ICD-10-CM

## 2022-10-30 DIAGNOSIS — J4489 Other specified chronic obstructive pulmonary disease: Secondary | ICD-10-CM

## 2022-10-30 LAB — LIPID PANEL
Cholesterol: 237 mg/dL — ABNORMAL HIGH (ref 0–200)
HDL: 56.7 mg/dL (ref >39.00)
LDL Cholesterol: 147 mg/dL — ABNORMAL HIGH (ref 0–99)
NonHDL: 180.31
Total CHOL/HDL Ratio: 4
Triglycerides: 166 mg/dL — ABNORMAL HIGH (ref 0.0–149.0)
VLDL: 33.2 mg/dL (ref 0.0–40.0)

## 2022-10-30 LAB — T4, FREE: Free T4: 0.55 ng/dL — ABNORMAL LOW (ref 0.60–1.60)

## 2022-10-30 LAB — TSH: TSH: 9.64 u[IU]/mL — ABNORMAL HIGH (ref 0.35–5.50)

## 2022-10-30 MED ORDER — SYNTHROID 150 MCG PO TABS
150.0000 ug | ORAL_TABLET | Freq: Every day | ORAL | 3 refills | Status: DC
Start: 2022-10-30 — End: 2022-12-01

## 2022-10-30 MED ORDER — OMEPRAZOLE 20 MG PO CPDR
20.0000 mg | DELAYED_RELEASE_CAPSULE | Freq: Every day | ORAL | 3 refills | Status: DC
Start: 2022-10-30 — End: 2022-11-26

## 2022-10-30 MED ORDER — PREDNISONE 10 MG (21) PO TBPK
ORAL_TABLET | ORAL | 0 refills | Status: DC
Start: 2022-10-30 — End: 2022-12-17

## 2022-10-30 MED ORDER — LEVOTHYROXINE SODIUM 150 MCG PO TABS
ORAL_TABLET | ORAL | 1 refills | Status: DC
Start: 2022-10-30 — End: 2022-10-30

## 2022-10-30 NOTE — Telephone Encounter (Signed)
I went in to refax this referral patient now has her PCP working on this because she does not want to go to [facility] so I will close this message they have placed the new referral

## 2022-10-30 NOTE — Progress Notes (Signed)
Is pt taking biotin by chance?  Wondering if this could be interfering with thyroid medication.  Does she take thyroid medication separated from food and or medication by at least one hour? Four hours separated from vitamins and also heart burn medication?   Her TSH thyroid level is elevated again I am curious if she is doing any of the above to alter. I am going to try to send in brand name synthroid to avoid additive agent that might cause fluctuations in levels. Have pt come in for lab only x 4 weeks to repeat, go back to taking 150 mg once daily x 7 days and I will send in brand name. Pt to start once received.

## 2022-10-30 NOTE — Patient Instructions (Addendum)
I have sent an electronic order over to your preferred location for the following:   []   2D Mammogram  [x]   3D Mammogram  [x]   Bone Density   Please give this center a call to get scheduled at your convenience.   [x]   [facility]      [address] [place], [place]        [phone]         Make sure to wear two piece  clothing  No lotions powders or deodorants the day of the appointment Make sure to bring picture ID and insurance card.  Bring list of medications you are currently taking including any supplements.    ------------------------------------  A referral was placed today for ENT  Please let us know if you have not heard back within 2 weeks about the referral.   Stop by the lab prior to leaving today. I will notify you of your results once received.   Recommendations on keeping yourself healthy:  - Exercise at least 30-45 minutes a day, 3-4 days a week.  - Eat a low-fat diet with lots of fruits and vegetables, up to 7-9 servings per day.  - Seatbelts can save your life. Wear them always.  - Smoke detectors on every level of your home, check batteries every year.  - Eye Doctor - have an eye exam every 1-2 years  - Safe sex - if you may be exposed to STDs, use a condom.  - Alcohol -  If you drink, do it moderately, less than 2 drinks per day.  - Health Care Power of Attorney. Choose someone to speak for you if you are not able.  - Depression is common in our stressful world.If you're feeling down or losing interest in things you normally enjoy, please come in for a visit.  - Violence - If anyone is threatening or hurting you, please call immediately.  Due to recent changes in healthcare laws, you may see results of your imaging and/or laboratory studies on MyChart before I have had a chance to review them.  I understand that in some cases there may be results that are confusing or concerning to you. Please understand that not all  results are received at the same time and often I may need to interpret multiple results in order to provide you with the best plan of care or course of treatment. Therefore, I ask that you please give me 2 business days to thoroughly review all your results before contacting my office for clarification. Should we see a critical lab result, you will be contacted sooner.   I will see you again in one year for your annual comprehensive exam unless otherwise stated and or with acute concerns.  It was a pleasure seeing you today! Please do not hesitate to reach out with any questions and or concerns.  Regards,   [name]

## 2022-10-30 NOTE — Progress Notes (Signed)
Subjective:  Patient ID: Lauren Beck, female    DOB: 01-10-1961  Age: 62 y.o. MRN: [id number]  Patient Care Team: [name], FNP as PCP - General (Family Medicine)   CC:  Chief Complaint  Patient presents with   Annual Exam    HPI Lauren Beck is a 62 y.o. female who presents today for an annual physical exam. She reports consuming a general diet. The patient does not participate in regular exercise at present. She generally feels well. She reports sleeping well. She does have additional problems to discuss today.   Vision:Not within last year Dental:No regular dental care STD:The patient denies history of sexually transmitted disease.  Lung Cancer Screening with low-dose Chest CT: will refer again pt did not make appt. - Adults age 61-80 who are current cigarette smokers or quit within the last 15 years. Must have 20 pack year history.  AAA Screening: n/a - Men age 54-75 who have ever smoked  Mammogram: 2007 ordered today  Last pap: 2007 pt to schedule  Colonoscopy:2023 Bone density scan:ordered, has never had.    Pt is with acute concerns.  Coughing, has since increased over the last few weeks but is starting to improve slightly.   Pending spirometry, PFT in one month.  Seeing pulmonologist regularly regularly.    Advanced Directives Patient does not have advanced directives including  n/a . She does not have a copy in the electronic medical record.   DEPRESSION SCREENING    10/30/2022    9:34 AM 10/06/2022    9:29 AM 07/24/2022   12:48 PM 11/14/2021   10:58 AM  PHQ 2/9 Scores  PHQ - 2 Score 1  2 0  PHQ- 9 Score   6 0  Exception Documentation  Patient refusal       ROS: Negative unless specifically indicated above in HPI.    Current Outpatient Medications:    acetaminophen (TYLENOL) 500 MG tablet, Take 500 mg by mouth every 6 (six) hours as needed., Disp: , Rfl:    bismuth subsalicylate (PEPTO BISMOL) 262 MG/15ML suspension, Take 15 mLs by mouth every 6  (six) hours as needed for indigestion or diarrhea or loose stools., Disp: , Rfl:    cetirizine (ZYRTEC) 10 MG tablet, Take 1 tablet (10 mg total) by mouth daily., Disp: 90 tablet, Rfl: 3   Fexofenadine HCl (ALLEGRA ALLERGY PO), Take by mouth., Disp: , Rfl:    fluticasone (FLONASE) 50 MCG/ACT nasal spray, Place 2 sprays into both nostrils daily., Disp: 16 g, Rfl: 6   Fluticasone-Umeclidin-Vilant (TRELEGY ELLIPTA) 100-62.5-25 MCG/ACT AEPB, Inhale 1 puff into the lungs daily., Disp: 14 each, Rfl: 0   montelukast (SINGULAIR) 10 MG tablet, Take 1 tablet (10 mg total) by mouth at bedtime., Disp: 30 tablet, Rfl: 3   Multiple Vitamin (MULTIVITAMIN WITH MINERALS) TABS tablet, Take 1 tablet by mouth daily., Disp: , Rfl:    Multiple Vitamins-Minerals (HAIR SKIN & NAILS PO), Take by mouth., Disp: , Rfl:    omeprazole (PRILOSEC) 20 MG capsule, Take 1 capsule (20 mg total) by mouth daily., Disp: 30 capsule, Rfl: 3   predniSONE (STERAPRED UNI-PAK 21 TAB) 10 MG (21) TBPK tablet, Take as directed, Disp: 1 each, Rfl: 0   vitamin B-12 (CYANOCOBALAMIN) 500 MCG tablet, Take 500 mcg by mouth daily., Disp: , Rfl:    albuterol (VENTOLIN HFA) 108 (90 Base) MCG/ACT inhaler, Inhale 2 puffs into the lungs every 4 (four) hours as needed for wheezing or shortness of breath. (Patient not  taking: Reported on 10/30/2022), Disp: 1 each, Rfl: 0   nicotine (NICODERM CQ - DOSED IN MG/24 HOURS) 21 mg/24hr patch, Place 1 patch (21 mg total) onto the skin daily. (Patient not taking: Reported on 10/30/2022), Disp: 28 patch, Rfl: 1   pregabalin (LYRICA) 75 MG capsule, Take 1 capsule (75 mg total) by mouth 2 (two) times daily., Disp: 84 capsule, Rfl: 0   SYNTHROID 150 MCG tablet, Take 1 tablet (150 mcg total) by mouth daily before breakfast., Disp: 90 tablet, Rfl: 3    Objective:    BP 122/72 (BP Location: Left Arm)   Pulse 82   Temp 98.1 F (36.7 C) (Temporal)   Ht 5\' 1"  (1.549 m)   Wt 137 lb 3.2 oz (62.2 kg)   LMP 04/06/2012   SpO2  98%   BMI 25.92 kg/m   BP Readings from Last 3 Encounters:  10/30/22 122/72  10/06/22 108/64  09/12/22 120/67      Physical Exam Constitutional:      General: She is not in acute distress.    Appearance: Normal appearance. She is normal weight. She is not ill-appearing.  HENT:     Head: Normocephalic.     Right Ear: Tympanic membrane normal.     Left Ear: Tympanic membrane normal.     Nose: Nose normal.     Mouth/Throat:     Mouth: Mucous membranes are moist.  Eyes:     Extraocular Movements: Extraocular movements intact.     Pupils: Pupils are equal, round, and reactive to light.  Cardiovascular:     Rate and Rhythm: Normal rate and regular rhythm.  Pulmonary:     Effort: Pulmonary effort is normal.     Breath sounds: Normal breath sounds.  Abdominal:     General: Abdomen is flat. Bowel sounds are normal.     Palpations: Abdomen is soft.     Tenderness: There is no guarding or rebound.  Musculoskeletal:        General: Normal range of motion.     Cervical back: Normal range of motion.  Skin:    General: Skin is warm.     Capillary Refill: Capillary refill takes less than 2 seconds.  Neurological:     General: No focal deficit present.     Mental Status: She is alert.  Psychiatric:        Mood and Affect: Mood normal.        Behavior: Behavior normal.        Thought Content: Thought content normal.        Judgment: Judgment normal.          Assessment & Plan:  Acute recurrent ethmoidal sinusitis -     Ambulatory referral to ENT  Acquired hypothyroidism -     TSH -     T4, free  Encounter for general adult medical examination with abnormal findings Assessment & Plan: Patient Counseling(The following topics were reviewed):  Preventative care handout given to pt  Health maintenance and immunizations reviewed. Please refer to Health maintenance section. Pt advised on safe sex, wearing seatbelts in car, and proper nutrition labwork ordered today for  annual Dental health: Discussed importance of regular tooth brushing, flossing, and dental visits.    Postmenopausal Assessment & Plan: Bone density ordered pending results.    Orders: -     DG Bone Density; Future  Screening mammogram for breast cancer -     3D Screening Mammogram, Left and Right; Future  Screening  for HIV (human immunodeficiency virus)  Encounter for hepatitis C screening test for low risk patient  Gastroesophageal reflux disease, unspecified whether esophagitis present Assessment & Plan: Stable Refill omeprazole 20 mg once daily.  Did d/w pt long term use of ppi's will reconsider as we go.  Try to decrease and or avoid spicy foods, fried fatty foods, and also caffeine and chocolate as these can increase heartburn symptoms.    Orders: -     Omeprazole; Take 1 capsule (20 mg total) by mouth daily.  Dispense: 30 capsule; Refill: 3  Herpes zoster vaccination declined  Tetanus toxoid declined  Abnormal TSH -     TSH -     T4, free  Mixed hyperlipidemia -     Lipid panel  COPD (chronic obstructive pulmonary disease) with chronic bronchitis -     Ambulatory Referral for Lung Cancer Scre  Current smoker -     Ambulatory Referral for Lung Cancer Scre  TOBACCO ABUSE -     Ambulatory Referral for Lung Cancer Scre  COPD exacerbation (HCC) -     predniSONE; Take as directed  Dispense: 1 each; Refill: 0      Follow-up: Return in about 6 months (around 05/02/2023) for f/u cholesterol.   [name], FNP

## 2022-10-31 ENCOUNTER — Ambulatory Visit (INDEPENDENT_AMBULATORY_CARE_PROVIDER_SITE_OTHER): Payer: 59 | Admitting: Orthopedic Surgery

## 2022-10-31 DIAGNOSIS — M5412 Radiculopathy, cervical region: Secondary | ICD-10-CM

## 2022-10-31 MED ORDER — PREGABALIN 75 MG PO CAPS: 75.0000 mg | ORAL_CAPSULE | Freq: Two times a day (BID) | ORAL | 0 refills | Status: DC

## 2022-10-31 NOTE — Progress Notes (Signed)
Orthopedic Spine Surgery Office Note  Assessment: Patient is a 62 y.o. female with neck pain that radiates into bilateral upper extremities.  On the left, it radiates into the left shoulder.  On the right, it radiates into the shoulder lateral arm and into the proximal forearm. She has gotten relief with cervical injection in the past    Plan: -Patient has tried activity modification, PT, oral steroids, ibuprofen, cervical steroid injection  -She has tried conservative treatments for over 1 year without any significant relief and has had symptoms for over 2 years, so discussed C4/5 and C5/6 ACDF as a treatment option. She wanted to proceed with this treatment.  -Will test for nicotine at pre-op appointment -Patient will next be seen at date of surgery   The patient has cervical radiculopathy, which was initially treated with non-operative measures. The patient's symptoms failed to improve with conservative treatments, so operative management was discussed in the form of C4-6 anterior cervical discectomy and fusion. The risks, including but not limited to pseudarthrosis, dysphagia, hematoma, airway compromise, recurrent laryngeal nerve injury, esophageal perforation, durotomy, spinal cord injury, nerve root injury, persistent pain, adjacent segment disease, infection, bleeding, hardware failure, vascular injury, heart attack, death, stroke, fracture, and need for additional procedures were discussed with the patient. The benefit of surgery would be improvement in the patient's radiating arm pain. Explained that the patient may not get full relief of her pain with this surgery, especially any neck pain. The alternatives to surgical management would be continued monitoring, physical therapy, over-the-counter pain medications, injections, traction, repeat injections, and activity modification. All the patient's questions were answered to her satisfaction. After this discussion, the patient expressed  understanding and elected to proceed with surgical intervention.     __________________________________________________________________________  History: Patient is a 62 y.o. female who has been previously seen in the office for symptoms consistent with cervical radiculopathy. Since the last visit, her symptoms have gotten more significant particularly on the right side. She has pain that starts in her neck and radiates into there right shoulder, lateral arm, and into the dorsal forearm. It does not radiate past the mid-forearm. She also has pain in the left shoulder. It does not radiate past the shoulder. She has pain at rest and has trouble sleeping at night due to the pain. Denies paresthesias and numbness. No difficulty with fine motor skills in her hands. No feelings of imbalance or unsteadiness with gait.   Of note, has quit nicotine-containing products  Previous treatments: activity modification, PT, oral steroids, ibuprofen, cervical steroid injection    Physical Exam:  BMI of 25.9  General: no acute distress, appears stated age Neurologic: alert, answering questions appropriately, following commands Respiratory: unlabored breathing on room air, symmetric chest rise Psychiatric: appropriate affect, normal cadence to speech   MSK (spine):  -Strength exam      Left  Right Grip strength               5/5  5/5 Interosseus   5/5   5/5 Wrist extension  5/5  5/5 Wrist flexion   5/5  5/5 Elbow flexion   5/5  5/5 Deltoid    4/5  4/5  EHL    5/5  5/5 TA    5/5  5/5 GSC    5/5  5/5 Knee extension  5/5  5/5 Hip flexion   5/5  5/5  -Sensory exam    Sensation intact to light touch in L3-S1 nerve distributions of bilateral lower extremities  Sensation intact to light touch in C5-T1 nerve distributions of bilateral upper extremities  -Brachioradialis DTR: 2/4 on the left, 2/4 on the right -Biceps DTR: 2/4 on the left, 2/4 on the right -Triceps DTR: 2/4 on the left, 2/4 on the  right -Achilles DTR: 2/4 on the left, 2/4 on the right -Patellar tendon DTR: 2/4 on the left, 2/4 on the right  -Spurling: negative bilaterally  -Hoffman sign: negative bilaterally -Clonus: no beats bilaterally  -Interosseous wasting: none seen -Grip and release test: negative -Romberg: negative  -Gait: normal  Left shoulder exam: no pain through range of motion, negative belly press, negative jobe, no weakness with external rotation with arm at side Right shoulder exam: no pain through range of motion, negative jobe, negative belly press, no weakness with external rotation with arm at side  Tinel's at wrist: negative bilaterally Phalen's at wrist: negative bilaterally Durkan's: negative bilaterally  Tinel's at elbow: negative bilaterally  Imaging: XR of the cervical spine from 03/24/2022 was previously independently reviewed and interpreted, showing no evidence of instability, no acute osseous abnormality, disc height loss at C4/5 and C5/6   MRI of the cervical spine from 10/27/2022 was independently reviewed and interpreted, showing left C4/5 and bilateral C5/6 foraminal stenosis. 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 NO central stenosis. No T2 cord signal change.     Patient name: Lauren Beck Patient MRN: [id number] Date of visit: 10/31/22

## 2022-10-31 NOTE — Progress Notes (Signed)
Pre-operative Scores  NDI: 48 VAS neck: 9 VAS arm: 9 SF-36:  -Physical functioning: 40  -Role limitations due to physical health: 0  -Role limitations due to emotional problems: 0  -Energy/fatigue: 35  -Emotional well-being: 50  -Social functioning: 50  -Pain: 22.5  -General health: 61  London Sheer, MD Orthopedic Surgeon

## 2022-11-03 NOTE — Assessment & Plan Note (Signed)
Bone density ordered pending results.   

## 2022-11-03 NOTE — Assessment & Plan Note (Signed)
Stable Refill omeprazole 20 mg once daily.  Did d/w pt long term use of ppi's will reconsider as we go.  Try to decrease and or avoid spicy foods, fried fatty foods, and also caffeine and chocolate as these can increase heartburn symptoms.

## 2022-11-03 NOTE — Assessment & Plan Note (Signed)
Patient Counseling(The following topics were reviewed): ? Preventative care handout given to pt  ?Health maintenance and immunizations reviewed. Please refer to Health maintenance section. ?Pt advised on safe sex, wearing seatbelts in car, and proper nutrition ?labwork ordered today for annual ?Dental health: Discussed importance of regular tooth brushing, flossing, and dental visits. ? ? ?

## 2022-11-04 NOTE — Progress Notes (Signed)
Can you please let pt know that she need to stop her biotin at least two days prior to getting her repeat thyroid lab test?   Biotin can otherwise skew the test results.

## 2022-11-05 ENCOUNTER — Telehealth: Payer: Self-pay | Admitting: Orthopedic Surgery

## 2022-11-05 NOTE — Telephone Encounter (Signed)
She was calling about her medication, I advised that it required a PA and that was done on Monday. I advised that she should call her pharmacy and have them to reprocess the request. She states that she will call them.

## 2022-11-05 NOTE — Telephone Encounter (Signed)
Patient advising she need to speak to a technician. Please call [phone]

## 2022-11-20 ENCOUNTER — Ambulatory Visit (INDEPENDENT_AMBULATORY_CARE_PROVIDER_SITE_OTHER): Payer: 59 | Admitting: Student

## 2022-11-20 ENCOUNTER — Other Ambulatory Visit: Payer: Self-pay | Admitting: Student

## 2022-11-20 ENCOUNTER — Ambulatory Visit: Payer: 59 | Admitting: Student

## 2022-11-20 DIAGNOSIS — R053 Chronic cough: Secondary | ICD-10-CM | POA: Diagnosis not present

## 2022-11-20 LAB — PULMONARY FUNCTION TEST
DL/VA % pred: 98 %
DL/VA: 4.25 ml/min/mmHg/L
DLCO cor % pred: 84 %
DLCO cor: 15.29 ml/min/mmHg
DLCO unc % pred: 84 %
DLCO unc: 15.29 ml/min/mmHg
FEF 25-75 Post: 0.93 L/sec
FEF 25-75 Pre: 0.73 L/sec
FEF2575-%Change-Post: 27 %
FEF2575-%Pred-Post: 43 %
FEF2575-%Pred-Pre: 34 %
FEV1-%Change-Post: 5 %
FEV1-%Pred-Post: 57 %
FEV1-%Pred-Pre: 54 %
FEV1-Post: 1.3 L
FEV1-Pre: 1.23 L
FEV1FVC-%Change-Post: 2 %
FEV1FVC-%Pred-Pre: 90 %
FEV6-%Change-Post: 0 %
FEV6-%Pred-Post: 62 %
FEV6-%Pred-Pre: 62 %
FEV6-Post: 1.77 L
FEV6-Pre: 1.75 L
FEV6FVC-%Change-Post: -2 %
FEV6FVC-%Pred-Post: 101 %
FEV6FVC-%Pred-Pre: 103 %
FVC-%Change-Post: 3 %
FVC-%Pred-Post: 61 %
FVC-%Pred-Pre: 60 %
FVC-Post: 1.81 L
FVC-Pre: 1.75 L
Post FEV1/FVC ratio: 72 %
Post FEV6/FVC ratio: 98 %
Pre FEV1/FVC ratio: 70 %
Pre FEV6/FVC Ratio: 100 %
RV % pred: 101 %
RV: 1.89 L
TLC % pred: 85 %
TLC: 3.91 L

## 2022-11-20 MED ORDER — TRELEGY ELLIPTA 100-62.5-25 MCG/ACT IN AEPB: 1.0000 | INHALATION_SPRAY | Freq: Every day | RESPIRATORY_TRACT | 0 refills | Status: DC

## 2022-11-20 NOTE — Patient Instructions (Signed)
Full PFT performed today. °

## 2022-11-20 NOTE — Progress Notes (Signed)
Full PFT performed today. °

## 2022-11-26 ENCOUNTER — Telehealth: Payer: Self-pay | Admitting: Family

## 2022-11-26 ENCOUNTER — Telehealth: Payer: Self-pay | Admitting: Orthopedic Surgery

## 2022-11-26 ENCOUNTER — Ambulatory Visit (INDEPENDENT_AMBULATORY_CARE_PROVIDER_SITE_OTHER): Payer: 59 | Admitting: Gastroenterology

## 2022-11-26 ENCOUNTER — Encounter: Payer: Self-pay | Admitting: Gastroenterology

## 2022-11-26 VITALS — BP 116/68 | HR 93 | Ht 61.0 in | Wt 139.0 lb

## 2022-11-26 DIAGNOSIS — R1319 Other dysphagia: Secondary | ICD-10-CM

## 2022-11-26 DIAGNOSIS — Z1211 Encounter for screening for malignant neoplasm of colon: Secondary | ICD-10-CM

## 2022-11-26 DIAGNOSIS — K449 Diaphragmatic hernia without obstruction or gangrene: Secondary | ICD-10-CM | POA: Diagnosis not present

## 2022-11-26 DIAGNOSIS — K219 Gastro-esophageal reflux disease without esophagitis: Secondary | ICD-10-CM | POA: Diagnosis not present

## 2022-11-26 MED ORDER — PANTOPRAZOLE SODIUM 40 MG PO TBEC: 40.0000 mg | DELAYED_RELEASE_TABLET | Freq: Two times a day (BID) | ORAL | 3 refills | Status: DC

## 2022-11-26 NOTE — Progress Notes (Signed)
[facility] progress note:  History: Lauren Beck 11/26/2022  Referring provider: [name], FNP  Reason for consult/chief complaint: Hernia and Gastroesophageal Reflux   Subjective  HPI: Lauren Beck was referred back to [place] by primary care after an office visit there several months ago.  She reported epigastric pain and persistent GERD symptoms.  She was also fatigued and had been off her thyroid replacement therapy for months.  TSH was markedly elevated greater than 40 and free T4 was low.  Synthroid was resumed, repeat labs 10/30/2022 show that she is still somewhat hypothyroid with TSH of 9.6 and free T4 of 0.54  I saw her in office consultation July 2023 for years of heartburn, dysphagia and dyspepsia.  Clinical details outlined in that office consult note.  Ongoing smoking with recurrent bronchitis.  Ultrasound negative for gallstones. Endoscopic procedures 02/14/2022: Colonoscopy complete to cecum, preparation poor (GoLytely prep).  Plan was for repeat colonoscopy in a year with 2 successive prep days of Suprep followed by GoLytely. Upper endoscopy with a 4 to 5 cm hiatal hernia resulting in distal esophageal tortuosity.  There was a mild Schatzki ring disrupted with biopsy forceps (endoscopic dilation would have been challenging since patient had significant coughing and gagging during procedure).  Mild diffuse nonspecific gastritis,, biopsies negative for H. pylori.  Patient strongly advised to continue acid suppression as well as nonpharmacologic antireflux measures, and also strongly advised to quit smoking.   Lauren Beck has ongoing trouble with dysphagia and heartburn.  Symptoms are much the same as before, she has variable results from OTC Zegerid, and sometimes takes additional as needed antacids.  Intermittent dysphagia to pills and solids.  She has upcoming cervical spine surgery and has been slowly cutting back smoking at the insistence of her surgeon.  Denies  weight loss.   ROS:  Review of Systems Neck pain, right arm pain and numbness Chronic cough and shortness of breath Denies exertional chest pain Remainder systems negative except as noted above  Past Medical History: Past Medical History:  Diagnosis Date   Allergic rhinitis    Arthritis    Bronchitis    COPD (chronic obstructive pulmonary disease) (HCC)    Excessive or frequent menstruation    GERD (gastroesophageal reflux disease)    History of migraines    Hypothyroidism    Nonspecific abnormal electrocardiogram (ECG) (EKG)    Pure hypercholesterolemia    Tobacco use disorder    Unspecified adjustment reaction      Past Surgical History: Past Surgical History:  Procedure Laterality Date   HAMMER TOE SURGERY Bilateral    PELVIC LAPAROSCOPY     exploratory, thought might have a cyst, not seen   TUBAL LIGATION       Family History: Family History  Problem Relation Age of Onset   Other Father        No contact--unknown   Breast cancer Sister    Thyroid disease Daughter    Anxiety disorder Daughter    OCD Daughter    Rectal cancer Neg Hx    Esophageal cancer Neg Hx    Stomach cancer Neg Hx    Colon cancer Neg Hx     Social History: Social History   Socioeconomic History   Marital status: Married    Spouse name: Not on file   Number of children: 3   Years of education: Not on file   Highest education level: Not on file  Occupational History   Occupation: Company secretary  Tobacco Use  Smoking status: Every Day    Packs/day: 1.50    Years: 45.00    Additional pack years: 0.00    Total pack years: 67.50    Types: Cigarettes    Start date: 50   Smokeless tobacco: Never   Tobacco comments:    Currently 1/2 ppd 02/28/22 [name], CMA  Vaping Use   Vaping Use: Some days   Substances: Nicotine  Substance and Sexual Activity   Alcohol use: Yes    Alcohol/week: 1.0 standard drink of alcohol    Types: 1 Glasses of wine per week    Comment:  occasional wine   Drug use: No    Comment: tried pot once, didn't like it   Sexual activity: Yes    Partners: Male    Birth control/protection: Surgical  Other Topics Concern   Not on file  Social History Narrative   Married--much stress at home with marriage      3 children         Social Determinants of Health   Financial Resource Strain: Not on file  Food Insecurity: Not on file  Transportation Needs: Not on file  Physical Activity: Not on file  Stress: Not on file  Social Connections: Not on file    Allergies: Allergies  Allergen Reactions   Benadryl [Diphenhydramine Hcl] Other (See Comments)    Makes patient extremely drowsy     Hydrocodone     States has headaches from such   Levaquin [Levofloxacin]     arthralgia    Outpatient Meds: Current Outpatient Medications  Medication Sig Dispense Refill   acetaminophen (TYLENOL) 500 MG tablet Take 500 mg by mouth every 6 (six) hours as needed.     bismuth subsalicylate (PEPTO BISMOL) 262 MG/15ML suspension Take 15 mLs by mouth every 6 (six) hours as needed for indigestion or diarrhea or loose stools.     cetirizine (ZYRTEC) 10 MG tablet Take 1 tablet (10 mg total) by mouth daily. 90 tablet 3   Fexofenadine HCl (ALLEGRA ALLERGY PO) Take by mouth.     fluticasone (FLONASE) 50 MCG/ACT nasal spray Place 2 sprays into both nostrils daily. 16 g 6   Fluticasone-Umeclidin-Vilant (TRELEGY ELLIPTA) 100-62.5-25 MCG/ACT AEPB Inhale 1 puff into the lungs daily. 14 each 0   Fluticasone-Umeclidin-Vilant (TRELEGY ELLIPTA) 100-62.5-25 MCG/ACT AEPB Inhale 1 puff into the lungs daily. 14 each 0   montelukast (SINGULAIR) 10 MG tablet Take 1 tablet (10 mg total) by mouth at bedtime. 30 tablet 3   Multiple Vitamin (MULTIVITAMIN WITH MINERALS) TABS tablet Take 1 tablet by mouth daily.     Multiple Vitamins-Minerals (HAIR SKIN & NAILS PO) Take by mouth.     nicotine (NICODERM CQ - DOSED IN MG/24 HOURS) 21 mg/24hr patch Place 1 patch (21 mg  total) onto the skin daily. 28 patch 1   omeprazole (PRILOSEC) 20 MG capsule Take 1 capsule (20 mg total) by mouth daily. 30 capsule 3   pregabalin (LYRICA) 75 MG capsule Take 1 capsule (75 mg total) by mouth 2 (two) times daily. 84 capsule 0   SYNTHROID 150 MCG tablet Take 1 tablet (150 mcg total) by mouth daily before breakfast. 90 tablet 3   vitamin B-12 (CYANOCOBALAMIN) 500 MCG tablet Take 500 mcg by mouth daily.     albuterol (VENTOLIN HFA) 108 (90 Base) MCG/ACT inhaler Inhale 2 puffs into the lungs every 4 (four) hours as needed for wheezing or shortness of breath. (Patient not taking: Reported on 10/30/2022) 1 each 0  predniSONE (STERAPRED UNI-PAK 21 TAB) 10 MG (21) TBPK tablet Take as directed (Patient not taking: Reported on 11/26/2022) 1 each 0   No current facility-administered medications for this visit.      ___________________________________________________________________ Objective   Exam:  BP 116/68   Pulse 93   Ht 5\' 1"  (1.549 m)   Wt 139 lb (63 kg)   LMP 04/06/2012   SpO2 97%   BMI 26.26 kg/m  Wt Readings from Last 3 Encounters:  11/26/22 139 lb (63 kg)  10/30/22 137 lb 3.2 oz (62.2 kg)  10/06/22 137 lb 12.8 oz (62.5 kg)    General: Well-appearing Eyes: sclera anicteric, no redness ENT: oral mucosa moist without lesions, no cervical or supraclavicular lymphadenopathy CV: Regular without appreciable murmur, no JVD, no peripheral edema Resp: clear to auscultation bilaterally, normal RR and effort noted GI: soft, no tenderness, with active bowel sounds. No guarding or palpable organomegaly noted.   Labs:  Lab Results  Component Value Date   TSH 9.64 (H) 10/30/2022   Hemoglobin A1c 6.0 on 10/06/2022, 6.4 in November 2023 and 6.2 in August 2023    Assessment: Encounter Diagnoses  Name Primary?   Gastroesophageal reflux disease without esophagitis Yes   Hiatal hernia    Esophageal dysphagia    Special screening for malignant neoplasms, colon      Ongoing symptoms of reflux with dysphagia, all which is most likely exacerbated by hiatal hernia and smoking.  I am very hopeful she will be able to completely stop smoking for her upcoming surgery and then indefinitely for multiple health reasons including her upper digestive issues. Difficult to know the extent to which the hiatal hernia is contributing to reflux, but it is most likely part of the problem.  It is causing dysphagia related to some anatomic abnormality of distal esophageal tortuosity and perhaps some reflux related dysmotility.  She could have an additional esophageal motility disorder, but this seems less likely. Hypothyroidism may also be affecting her upper GI motility and contributing to reflux lately.  She will also be due for repeat screening colonoscopy with better bowel preparation, but we will defer this to later in the year since she has an upcoming cervical spine surgery.  She now tells me she was unable to tolerate the GoLytely bowel preparation due to vomiting, so for the next exam we will most likely do a 2-day prep with MiraLAX and Suprep along with Reglan.  Plan:  Regarding the reflux, current plan is to stop OTC Zegerid and start pantoprazole 40 mg twice daily. I told her this should reduce symptoms but may not be able to completely eliminate them.  She still has ongoing dietary triggers based on her description of usual food intake. Nonpharmacologic antireflux measures are at least as important as medicines to control symptoms, particularly try not to eat within several hours of bed is essential.  Upper GI series (no small bowel follow-through), upright and recumbent with liquid and tablet.  This will help define the hiatal hernia and the reflux more thoroughly so we can later discuss whether she may benefit from consultation considering hiatal hernia repair. She would have to stop smoking completely to be considered for that sort of surgery, and might need  esophageal motility testing beforehand.  Testing as above, follow-up planned afterward.  Open to get this testing done prior to her upcoming cervical surgery.   30 minutes were spent on this encounter (including chart review, history/exam, counseling/coordination of care, and documentation) > 50% of  that time was spent on counseling and coordination of care.   Charlie Pitter III  CC: Referring provider noted above

## 2022-11-26 NOTE — Telephone Encounter (Signed)
Called and left a message advising to call the office back to schedule a Pre-op appt for medical clearance per Tabitha. We can complete the form and then fax to [facility] afterwards.

## 2022-11-26 NOTE — Patient Instructions (Addendum)
_______________________________________________________  If your blood pressure at your visit was 140/90 or greater, please contact your primary care physician to follow up on this.  _______________________________________________________  If you are age 61 or older, your body mass index should be between 23-30. Your Body mass index is 26.26 kg/m. If this is out of the aforementioned range listed, please consider follow up with your Primary Care Provider.  If you are age 1 or younger, your body mass index should be between 19-25. Your Body mass index is 26.26 kg/m. If this is out of the aformentioned range listed, please consider follow up with your Primary Care Provider.   ________________________________________________________  The [facility] providers would like to encourage you to use [facility] to communicate with providers for non-urgent requests or questions.  Due to long hold times on the telephone, sending your provider a message by [facility] may be a faster and more efficient way to get a response.  Please allow 48 business hours for a response.  Please remember that this is for non-urgent requests.  _______________________________________________________  Lauren Beck have been scheduled for an Upper GI Series at [facility]. Your appointment is on 12-03-2022 at 10am. Please arrive 30 minutes prior to your test for registration. Make sure not to eat or drink anything after midnight on the night before your test. If you need to reschedule, please call radiology at [phone]. ________________________________________________________________ An upper GI series uses x rays to help diagnose problems of the upper GI tract, which includes the esophagus, stomach, and duodenum. The duodenum is the first part of the small intestine. An upper GI series is conducted by a radiology technologist or a radiologist--a doctor who specializes in x-ray imaging--at a hospital or outpatient center. While sitting or  standing in front of an x-ray machine, the patient drinks barium liquid, which is often white and has a chalky consistency and taste. The barium liquid coats the lining of the upper GI tract and makes signs of disease show up more clearly on x rays. X-ray video, called fluoroscopy, is used to view the barium liquid moving through the esophagus, stomach, and duodenum. Additional x rays and fluoroscopy are performed while the patient lies on an x-ray table. To fully coat the upper GI tract with barium liquid, the technologist or radiologist may press on the abdomen or ask the patient to change position. Patients hold still in various positions, allowing the technologist or radiologist to take x rays of the upper GI tract at different angles. If a technologist conducts the upper GI series, a radiologist will later examine the images to look for problems.  This test typically takes about 1 hour to complete. __________________________________________________________________   Due to recent changes in healthcare laws, you may see the results of your imaging and laboratory studies on MyChart before your provider has had a chance to review them.  We understand that in some cases there may be results that are confusing or concerning to you. Not all laboratory results come back in the same time frame and the provider may be waiting for multiple results in order to interpret others.  Please give [place] 48 hours in order for your provider to thoroughly review all the results before contacting the office for clarification of your results.   Please stop the Zegrid over the counter.   Start pantoprazole 40 mg, twice a day.   It was a pleasure to see you today!  Thank you for trusting me with your gastrointestinal care!

## 2022-11-26 NOTE — Telephone Encounter (Signed)
Patient called in stating that Dr Christell Constant her orthopedic doctor has sent over surgery clearance for Lauren Beck to fill out stating that she is tabithas patient and she is okay to have surgery for her 3 bone spurs.She said that they have not received the form back yet and she would like to know what's going on?

## 2022-11-27 ENCOUNTER — Other Ambulatory Visit: Payer: 59

## 2022-11-27 NOTE — Telephone Encounter (Signed)
Patient scheduled.

## 2022-11-28 ENCOUNTER — Ambulatory Visit (INDEPENDENT_AMBULATORY_CARE_PROVIDER_SITE_OTHER)
Admission: RE | Admit: 2022-11-28 | Discharge: 2022-11-28 | Disposition: A | Discharge status: Home / Self Care | Payer: 59 | Source: Ambulatory Visit | Attending: Family | Admitting: Family

## 2022-11-28 ENCOUNTER — Encounter: Payer: Self-pay | Admitting: Family

## 2022-11-28 ENCOUNTER — Ambulatory Visit (INDEPENDENT_AMBULATORY_CARE_PROVIDER_SITE_OTHER): Payer: 59 | Admitting: Family

## 2022-11-28 VITALS — BP 126/82 | HR 98 | Temp 97.6°F | Ht 61.0 in | Wt 138.2 lb

## 2022-11-28 DIAGNOSIS — I739 Peripheral vascular disease, unspecified: Secondary | ICD-10-CM | POA: Diagnosis not present

## 2022-11-28 DIAGNOSIS — J4489 Other specified chronic obstructive pulmonary disease: Secondary | ICD-10-CM | POA: Diagnosis not present

## 2022-11-28 DIAGNOSIS — F17200 Nicotine dependence, unspecified, uncomplicated: Secondary | ICD-10-CM

## 2022-11-28 DIAGNOSIS — R0609 Other forms of dyspnea: Secondary | ICD-10-CM

## 2022-11-28 DIAGNOSIS — Z01818 Encounter for other preprocedural examination: Secondary | ICD-10-CM | POA: Diagnosis not present

## 2022-11-28 DIAGNOSIS — R9431 Abnormal electrocardiogram [ECG] [EKG]: Secondary | ICD-10-CM

## 2022-11-28 DIAGNOSIS — R7303 Prediabetes: Secondary | ICD-10-CM | POA: Diagnosis not present

## 2022-11-28 DIAGNOSIS — E782 Mixed hyperlipidemia: Secondary | ICD-10-CM

## 2022-11-28 DIAGNOSIS — E039 Hypothyroidism, unspecified: Secondary | ICD-10-CM

## 2022-11-28 LAB — COMPREHENSIVE METABOLIC PANEL
ALT: 22 U/L (ref 0–35)
AST: 20 U/L (ref 0–37)
Albumin: 4 g/dL (ref 3.5–5.2)
Alkaline Phosphatase: 122 U/L — ABNORMAL HIGH (ref 39–117)
BUN: 16 mg/dL (ref 6–23)
CO2: 29 mEq/L (ref 19–32)
Calcium: 9.5 mg/dL (ref 8.4–10.5)
Chloride: 104 mEq/L (ref 96–112)
Creatinine, Ser: 0.82 mg/dL (ref 0.40–1.20)
GFR: 77.3 mL/min (ref >60.00)
Glucose, Bld: 76 mg/dL (ref 70–99)
Potassium: 4.7 mEq/L (ref 3.5–5.1)
Sodium: 141 mEq/L (ref 135–145)
Total Bilirubin: 0.3 mg/dL (ref 0.2–1.2)
Total Protein: 6.8 g/dL (ref 6.0–8.3)

## 2022-11-28 LAB — TSH: TSH: 0.17 u[IU]/mL — ABNORMAL LOW (ref 0.35–5.50)

## 2022-11-28 LAB — CBC
HCT: 41.8 % (ref 36.0–46.0)
Hemoglobin: 13.8 g/dL (ref 12.0–15.0)
MCHC: 32.9 g/dL (ref 30.0–36.0)
MCV: 95.3 fl (ref 78.0–100.0)
Platelets: 324 10*3/uL (ref 150.0–400.0)
RBC: 4.39 Mil/uL (ref 3.87–5.11)
RDW: 12.8 % (ref 11.5–15.5)
WBC: 6.7 10*3/uL (ref 4.0–10.5)

## 2022-11-28 LAB — PROTIME-INR
INR: 1 ratio (ref 0.8–1.0)
Prothrombin Time: 10.7 s (ref 9.6–13.1)

## 2022-11-28 NOTE — Progress Notes (Unsigned)
Assessment & Plan:  Preoperative clearance -     EKG 12-Lead -     DG Chest 2 View; Future  COPD (chronic obstructive pulmonary disease) with chronic bronchitis Assessment & Plan: Pt to see her pulmonologist for pulmonary clearance.    Orders: -     DG Chest 2 View; Future  DOE (dyspnea on exertion) -     DG Chest 2 View; Future  Current smoker -     DG Chest 2 View; Future -     CBC -     Ambulatory referral to Vascular Surgery  Intermittent claudication [facility]) Assessment & Plan: Did order ABIs in the past, pt did not schedule.  Referral placed for vascular surgery due to physical exam and pt complaints. Pt is a smoker    Orders: -     Protime-INR -     Ambulatory referral to Vascular Surgery  Acquired hypothyroidism -     TSH  Prediabetes Assessment & Plan: stable   Orders: -     Comprehensive metabolic panel  Mixed hyperlipidemia  TOBACCO ABUSE Assessment & Plan: Did d/w pt to decrease to 21 mg ONE patch rather than two. Advised pt not to change medications without informing provider first. Continue to try to decrease cigarettes, as surgery likely not to be cleared prior with smoking.    Pre-operative examination for internal medicine Assessment & Plan: EKG reviewed in office, short PR interval stable from prior METS score < 4 so reassuring  No active symptoms, no increasing DOE or sob CXR reviewed, no acute findings,  Labs ordered and reviewed, unremarkable.  Urine negative for infection.   Cleared from a primary care perspective, however pt not to be cleared until nicotine free, so advised to continue on cessation.   Orders: -     Urinalysis w microscopic + reflex cultur -     Protime-INR -     Comprehensive metabolic panel -     CBC  Shortened PR interval Assessment & Plan: Stable currently asymptomatic.  Stable from 8/23 EKG prior pr 117, today 118   Other orders -     REFLEXIVE URINE CULTURE    I have independently evaluated  patient.  Lauren Beck is a 62 y.o. female who is moderate risk for a elective surgery.  There are modifiable risk factors (smoking, wearing nicotine patch, has decreased 2-3 cigarettes a day from 1 pack a day).  RCRI is: 3.9%    Lab Results  Component Value Date   CHOL 237 (H) 10/30/2022   HDL 56.70 10/30/2022   LDLCALC 147 (H) 10/30/2022   LDLDIRECT 137.8 02/29/2008   TRIG 166.0 (H) 10/30/2022   CHOLHDL 4 10/30/2022       No follow-ups on file.  [name], MSN, APRN, FNP-C [facility]  Subjective:      HPI: Pt is a 62 y.o. female who is here for preoperative clearance for a C4-C6 anterior cervical discectomy and fusion under general anesthesia.  METS score < 4  COPD current smoker about 5 cigarettes, wearing two nicotine patches today (self increased her 21 mg patch once a day to twice daily)  Does report today that she has noticed her feet become more 'blue' then usual and feel more cold than usual. They do hurt when she walks at times.   1) High Risk Cardiac Conditions:  1) Recent MI - No.  2) Decompensated Heart Failure - No.  3) Unstable angina - No.  4) Symptomatic arrythmia - No.  5) Sx Valvular Disease - No.  2) Intermediate Risk Factors: DM, CKD, CVA, CHF, CAD - Yes.   HLD, COPD, current smoker , prediabetes  3) Surgery Specific Risk:           Intermediate (Carotid, Head and Neck, Orthopaedic )            4) Further Noninvasive evaluation:   1) EKG - Yes.     Hx of MI, CVA, CAD, DM, CKD    4) CXR Yes.     If asymptomatic, healthy, no respiratory symptoms it is not needed  5)PFTs , pending pulmonary clearance with pulmonologist         New complaints: Copd; sees pulmonologist, next appt is 6/26   Social history:  Relevant past medical, surgical, family and social history reviewed and updated as indicated. Interim medical history since our last visit reviewed.  Allergies and medications reviewed and  updated.  DATA REVIEWED: CHART IN EPIC  ROS: Negative unless specifically indicated above in HPI.    Current Outpatient Medications:    acetaminophen (TYLENOL) 500 MG tablet, Take 500 mg by mouth every 6 (six) hours as needed., Disp: , Rfl:    albuterol (VENTOLIN HFA) 108 (90 Base) MCG/ACT inhaler, Inhale 2 puffs into the lungs every 4 (four) hours as needed for wheezing or shortness of breath., Disp: 1 each, Rfl: 0   bismuth subsalicylate (PEPTO BISMOL) 262 MG/15ML suspension, Take 15 mLs by mouth every 6 (six) hours as needed for indigestion or diarrhea or loose stools., Disp: , Rfl:    cetirizine (ZYRTEC) 10 MG tablet, Take 1 tablet (10 mg total) by mouth daily., Disp: 90 tablet, Rfl: 3   Fexofenadine HCl (ALLEGRA ALLERGY PO), Take by mouth., Disp: , Rfl:    fluticasone (FLONASE) 50 MCG/ACT nasal spray, Place 2 sprays into both nostrils daily., Disp: 16 g, Rfl: 6   Fluticasone-Umeclidin-Vilant (TRELEGY ELLIPTA) 100-62.5-25 MCG/ACT AEPB, Inhale 1 puff into the lungs daily., Disp: 14 each, Rfl: 0   Fluticasone-Umeclidin-Vilant (TRELEGY ELLIPTA) 100-62.5-25 MCG/ACT AEPB, Inhale 1 puff into the lungs daily., Disp: 14 each, Rfl: 0   montelukast (SINGULAIR) 10 MG tablet, Take 1 tablet (10 mg total) by mouth at bedtime., Disp: 30 tablet, Rfl: 3   Multiple Vitamin (MULTIVITAMIN WITH MINERALS) TABS tablet, Take 1 tablet by mouth daily., Disp: , Rfl:    Multiple Vitamins-Minerals (HAIR SKIN & NAILS PO), Take by mouth., Disp: , Rfl:    nicotine (NICODERM CQ - DOSED IN MG/24 HOURS) 21 mg/24hr patch, Place 1 patch (21 mg total) onto the skin daily., Disp: 28 patch, Rfl: 1   pantoprazole (PROTONIX) 40 MG tablet, Take 1 tablet (40 mg total) by mouth 2 (two) times daily., Disp: 60 tablet, Rfl: 3   predniSONE (STERAPRED UNI-PAK 21 TAB) 10 MG (21) TBPK tablet, Take as directed, Disp: 1 each, Rfl: 0   pregabalin (LYRICA) 75 MG capsule, Take 1 capsule (75 mg total) by mouth 2 (two) times daily., Disp: 84  capsule, Rfl: 0   vitamin B-12 (CYANOCOBALAMIN) 500 MCG tablet, Take 500 mcg by mouth daily., Disp: , Rfl:    SYNTHROID 150 MCG tablet, Take one po qd for six days, do not take on day 7, Disp: 90 tablet, Rfl: 3      Objective:    BP 126/82   Pulse 98   Temp 97.6 F (36.4 C) (Temporal)   Ht 5\' 1"  (1.549 m)   Wt 138 lb 3.2  oz (62.7 kg)   LMP 04/06/2012   SpO2 98%   BMI 26.11 kg/m   Wt Readings from Last 3 Encounters:  11/28/22 138 lb 3.2 oz (62.7 kg)  11/26/22 139 lb (63 kg)  10/30/22 137 lb 3.2 oz (62.2 kg)    Physical Exam Constitutional:      General: She is not in acute distress.    Appearance: Normal appearance. She is normal weight. She is not ill-appearing.  HENT:     Head: Normocephalic.     Right Ear: Tympanic membrane normal.     Left Ear: Tympanic membrane normal.     Nose: Nose normal.     Mouth/Throat:     Mouth: Mucous membranes are moist.  Eyes:     Extraocular Movements: Extraocular movements intact.     Pupils: Pupils are equal, round, and reactive to light.  Cardiovascular:     Rate and Rhythm: Normal rate and regular rhythm.     Pulses:          Dorsalis pedis pulses are 1+ on the right side and 1+ on the left side.       Posterior tibial pulses are 1+ on the right side and 1+ on the left side.  Pulmonary:     Effort: Pulmonary effort is normal.     Breath sounds: Normal breath sounds.  Abdominal:     General: Abdomen is flat. Bowel sounds are normal.     Palpations: Abdomen is soft.     Tenderness: There is no guarding or rebound.  Musculoskeletal:        General: Normal range of motion.     Cervical back: Normal range of motion.     Right lower leg: No edema.     Left lower leg: No edema.  Skin:    General: Skin is warm.     Capillary Refill: Capillary refill takes less than 2 seconds.  Neurological:     General: No focal deficit present.     Mental Status: She is alert.  Psychiatric:        Mood and Affect: Mood normal.         Behavior: Behavior normal.        Thought Content: Thought content normal.        Judgment: Judgment normal.

## 2022-11-28 NOTE — Assessment & Plan Note (Signed)
Stable currently asymptomatic.  Stable from 8/23 EKG prior pr 117, today 118

## 2022-11-29 LAB — URINALYSIS W MICROSCOPIC + REFLEX CULTURE
Bacteria, UA: NONE SEEN /HPF
Bilirubin Urine: NEGATIVE
Glucose, UA: NEGATIVE
Hgb urine dipstick: NEGATIVE
Hyaline Cast: NONE SEEN /LPF
Ketones, ur: NEGATIVE
Leukocyte Esterase: NEGATIVE
Nitrites, Initial: NEGATIVE
Protein, ur: NEGATIVE
RBC / HPF: NONE SEEN /HPF (ref 0–2)
Specific Gravity, Urine: 1.011 (ref 1.001–1.035)
WBC, UA: NONE SEEN /HPF (ref 0–5)
pH: 6 (ref 5.0–8.0)

## 2022-11-29 LAB — NO CULTURE INDICATED

## 2022-12-01 ENCOUNTER — Telehealth: Payer: Self-pay | Admitting: Family

## 2022-12-01 ENCOUNTER — Other Ambulatory Visit: Payer: Self-pay | Admitting: Family

## 2022-12-01 DIAGNOSIS — E039 Hypothyroidism, unspecified: Secondary | ICD-10-CM

## 2022-12-01 DIAGNOSIS — Z01818 Encounter for other preprocedural examination: Secondary | ICD-10-CM | POA: Insufficient documentation

## 2022-12-01 DIAGNOSIS — J441 Chronic obstructive pulmonary disease with (acute) exacerbation: Secondary | ICD-10-CM

## 2022-12-01 MED ORDER — SYNTHROID 150 MCG PO TABS
ORAL_TABLET | ORAL | 3 refills | Status: DC
Start: 2022-12-01 — End: 2023-10-19

## 2022-12-01 NOTE — Telephone Encounter (Signed)
EKG was stable. No need for cardiology consult.  However, her surgeon will be reaching out to her.  Nicotine has to be completely stopped prior to even considering surgery so until patches and cigarettes are completely d/c she will not be able to move forward. Chest xray also looked good.

## 2022-12-01 NOTE — Telephone Encounter (Signed)
Patient called in regarding her lab results and EKG,she would like a phone call to discuss.

## 2022-12-01 NOTE — Assessment & Plan Note (Signed)
Did d/w pt to decrease to 21 mg ONE patch rather than two. Advised pt not to change medications without informing provider first. Continue to try to decrease cigarettes, as surgery likely not to be cleared prior with smoking.

## 2022-12-01 NOTE — Progress Notes (Signed)
Stable findings on chest xray.  I did reach out to your surgeon, and he stated he will be unable to proceed with the surgery until you are completely off of the nicotine patches and also the cigarettes. He said that this will depend on you, and so I would still continue to try your best to quit. Nicotine patches will still show positive nicotine in the urine test.

## 2022-12-01 NOTE — Telephone Encounter (Signed)
Spoke with the patient and advised as instructed. Pt understood and agrees with plan. Mailed results to the patient.

## 2022-12-01 NOTE — Assessment & Plan Note (Signed)
stable °

## 2022-12-01 NOTE — Telephone Encounter (Signed)
duplicate

## 2022-12-01 NOTE — Telephone Encounter (Signed)
Patient called in and wanted to follow up on her lab results and imaging results. Thank you!

## 2022-12-01 NOTE — Progress Notes (Signed)
Your thyroid levels are lower then they need to be. Continue synthroid 150 mcg once daily but change to once daily x 6 days, do not take on day 7. For example take one pill Monday to Saturday, do not take Sunday.

## 2022-12-01 NOTE — Telephone Encounter (Signed)
Received the lab results. Please advise on the EKG results.

## 2022-12-01 NOTE — Assessment & Plan Note (Signed)
Pt to see her pulmonologist for pulmonary clearance.

## 2022-12-01 NOTE — Assessment & Plan Note (Addendum)
EKG reviewed in office, short PR interval stable from prior METS score < 4 so reassuring  No active symptoms, no increasing DOE or sob CXR reviewed, no acute findings,  Labs ordered and reviewed, unremarkable.  Urine negative for infection.   Cleared from a primary care perspective, however pt not to be cleared until nicotine free, so advised to continue on cessation.

## 2022-12-01 NOTE — Assessment & Plan Note (Signed)
Did order ABIs in the past, pt did not schedule.  Referral placed for vascular surgery due to physical exam and pt complaints. Pt is a smoker

## 2022-12-01 NOTE — Progress Notes (Signed)
EKG with shortened PR however METS score <4

## 2022-12-02 ENCOUNTER — Telehealth: Payer: Self-pay | Admitting: Family

## 2022-12-02 ENCOUNTER — Telehealth: Payer: Self-pay | Admitting: Orthopedic Surgery

## 2022-12-02 NOTE — Telephone Encounter (Signed)
FYI: I have spoken with patient on several occasions now in reference to Physical Therapy. Patient states she did not go anywhere else to PT but our location and it was in 2023. Our records show she was here 11/2020 with a total of 4 visits. I confirmed this with Moshe Cipro in physical therapy. Per our records patient does not meet [employer] requirements for surgery. As I was discussing this with the patient this last time, she got irritated with me stating she had PT here in 2023, then hung up on me. I will hold on scheduling patient for surgery at this point unless otherwise instructed from Dr. Christell Constant.

## 2022-12-02 NOTE — Telephone Encounter (Signed)
Patients husband called asking for a referral to be sent to another orthopedic,due to Dr Christell Constant not doing patients surgery,because she did not complete therapy in 2023,but in 2022.

## 2022-12-03 ENCOUNTER — Ambulatory Visit (HOSPITAL_COMMUNITY)
Admission: RE | Admit: 2022-12-03 | Discharge: 2022-12-03 | Disposition: A | Discharge status: Home / Self Care | Payer: 59 | Source: Ambulatory Visit | Attending: Gastroenterology | Admitting: Gastroenterology

## 2022-12-03 ENCOUNTER — Other Ambulatory Visit: Payer: Self-pay | Admitting: Gastroenterology

## 2022-12-03 ENCOUNTER — Ambulatory Visit: Payer: 59 | Admitting: Student

## 2022-12-03 ENCOUNTER — Encounter (HOSPITAL_COMMUNITY): Payer: Self-pay

## 2022-12-03 DIAGNOSIS — Z1211 Encounter for screening for malignant neoplasm of colon: Secondary | ICD-10-CM | POA: Diagnosis present

## 2022-12-03 DIAGNOSIS — K449 Diaphragmatic hernia without obstruction or gangrene: Secondary | ICD-10-CM | POA: Diagnosis present

## 2022-12-03 DIAGNOSIS — R1319 Other dysphagia: Secondary | ICD-10-CM | POA: Insufficient documentation

## 2022-12-03 DIAGNOSIS — K219 Gastro-esophageal reflux disease without esophagitis: Secondary | ICD-10-CM | POA: Diagnosis not present

## 2022-12-03 NOTE — Telephone Encounter (Signed)
Left message to return call to our office.  

## 2022-12-03 NOTE — Telephone Encounter (Signed)
Have pt look up an orthopedist that she would like to see and let us know and they will place referral. She can call her insurance to inquire about available orthopedist under her insurance

## 2022-12-04 ENCOUNTER — Telehealth: Payer: Self-pay

## 2022-12-04 DIAGNOSIS — E782 Mixed hyperlipidemia: Secondary | ICD-10-CM

## 2022-12-04 DIAGNOSIS — Z79899 Other long term (current) drug therapy: Secondary | ICD-10-CM

## 2022-12-04 NOTE — Telephone Encounter (Signed)
Left message to return call to our office. There are 2 messages open that need to be addressed with patient.   

## 2022-12-04 NOTE — Telephone Encounter (Signed)
Left message to return call to our office. There are 2 messages open that need to be addressed with patient.

## 2022-12-04 NOTE — Telephone Encounter (Signed)
-----   Message from [name], [place] sent at 12/01/2022  3:16 PM EDT ----- Can we call pt and request pt to start statin? This would also reduce cardiovascular disease risk factors and help with plaque in the arteries.  Considering she is a smoker and pending vascular workup this would be a really good idea.

## 2022-12-05 NOTE — Telephone Encounter (Signed)
Unable to reach patient. Left voicemail to return call to our office.    There are 2 messages open that need to be addressed with patient.  

## 2022-12-05 NOTE — Telephone Encounter (Signed)
Unable to reach patient. Left voicemail to return call to our office.    There are 2 messages open that need to be addressed with patient.

## 2022-12-08 MED ORDER — ATORVASTATIN CALCIUM 10 MG PO TABS
10.0000 mg | ORAL_TABLET | Freq: Every day | ORAL | 3 refills | Status: AC
Start: 2022-12-08 — End: ?

## 2022-12-08 NOTE — Telephone Encounter (Signed)
Patient called back given information will reach out when when she has name of surgeon she would like to be sent to.

## 2022-12-08 NOTE — Telephone Encounter (Signed)
Called patient reviewed all information and repeated back to me. Will call if any questions.  Wanted to let you know that the hernia  is still the same and Dr. Nolon Stalls is keeping her on medications that you started her on. She has follow up with them in September.   Has appointment with Dr.Cram this week.   She is ok with starting statin. Would like to have sent to [facility] on [address].

## 2022-12-08 NOTE — Addendum Note (Signed)
Addended by: [name] on: 12/08/2022 10:47 AM   Modules accepted: Orders

## 2022-12-08 NOTE — Telephone Encounter (Signed)
Lipitor sent to pharmacy.

## 2022-12-08 NOTE — Telephone Encounter (Signed)
Sent lipitor to pharmacy.  Have her start this and follow up in three months fasting for repeat cholesterol level

## 2022-12-09 NOTE — Telephone Encounter (Signed)
Left message to return call to our office.  

## 2022-12-12 NOTE — Telephone Encounter (Signed)
Called patient fasting lab appointment made. Did not see lab order yet want me to put in?

## 2022-12-15 ENCOUNTER — Other Ambulatory Visit: Payer: Self-pay | Admitting: Neurological Surgery

## 2022-12-15 NOTE — Addendum Note (Signed)
Addended by: [name] on: 12/15/2022 09:53 AM   Modules accepted: Orders

## 2022-12-15 NOTE — Telephone Encounter (Signed)
Future lab orders placed. Can close.

## 2022-12-15 NOTE — Progress Notes (Signed)
Synopsis: Referred for COPD by [name], FNP  Subjective:   PATIENT ID: Lauren Beck GENDER: female DOB: 12/25/1960, MRN: [id number]  No chief complaint on file.  61yF with history of AR, COPD, GERD, hypothyroid, smoking referred for COPD  Dyspnea and left upper chest pain (she thinks it is exertional). Albuterol helps some. She's also got anoro but isn't sure if it's helpful - has been using it for about a month. Last course of prednisone was last month. She isn't sure if it helped. She does have a cough but it's mostly dry. No BLE swelling, orthopnea.   She has never had a stress test or seen a cardiologist  She has no family history of lung disease  She works in [employer]. She is a crusher. 1ppd 30-40y smoker, currently half ppd. Vaping nicotine. No MJ. She has a dog. She is from [place].   Interval HPI  Started on stiolto last visit - she did try it but she has some difficulty with technique.   Had sinus infection, ear infection, bronchitis in December.   Stopped taking levaquin which was prescribed 2/19 due to migratory MSK pain afterward. At 2/19 visit she was also started on singulair which she does think has helped. She has had prednisone burst at least once or twice since December. Hasn't had fever since December.   Still half ppd smoking -------------------- Started on trelegy last visit  Sinus CT unremarkable, referred to ENT  PFT 11/20/22 with moderately severe obstruction  Otherwise pertinent review of systems is negative.    Past Medical History:  Diagnosis Date   Allergic rhinitis    Arthritis    Bronchitis    COPD (chronic obstructive pulmonary disease) (HCC)    Excessive or frequent menstruation    GERD (gastroesophageal reflux disease)    History of migraines    Hypothyroidism    Nonspecific abnormal electrocardiogram (ECG) (EKG)    Pure hypercholesterolemia    Tobacco use disorder    Unspecified adjustment reaction      Family History   Problem Relation Age of Onset   Other Father        No contact--unknown   Breast cancer Sister    Thyroid disease Daughter    Anxiety disorder Daughter    OCD Daughter    Rectal cancer Neg Hx    Esophageal cancer Neg Hx    Stomach cancer Neg Hx    Colon cancer Neg Hx      Past Surgical History:  Procedure Laterality Date   HAMMER TOE SURGERY Bilateral    PELVIC LAPAROSCOPY     exploratory, thought might have a cyst, not seen   TUBAL LIGATION      Social History   Socioeconomic History   Marital status: Married    Spouse name: Not on file   Number of children: 3   Years of education: Not on file   Highest education level: Not on file  Occupational History   Occupation: Company secretary  Tobacco Use   Smoking status: Every Day    Packs/day: 1.50    Years: 45.00    Additional pack years: 0.00    Total pack years: 67.50    Types: Cigarettes    Start date: 1980   Smokeless tobacco: Never   Tobacco comments:    Currently 1/2 ppd 02/28/22 [name], CMA  Vaping Use   Vaping Use: Some days   Substances: Nicotine  Substance and Sexual Activity   Alcohol use: Yes  Alcohol/week: 1.0 standard drink of alcohol    Types: 1 Glasses of wine per week    Comment: occasional wine   Drug use: No    Comment: tried pot once, didn't like it   Sexual activity: Yes    Partners: Male    Birth control/protection: Surgical  Other Topics Concern   Not on file  Social History Narrative   Married--much stress at home with marriage      3 children         Social Determinants of Health   Financial Resource Strain: Not on file  Food Insecurity: Not on file  Transportation Needs: Not on file  Physical Activity: Not on file  Stress: Not on file  Social Connections: Not on file  Intimate Partner Violence: Not on file     Allergies  Allergen Reactions   Benadryl [Diphenhydramine Hcl] Other (See Comments)    Makes patient extremely drowsy     Hydrocodone     States  has headaches from such   Levaquin [Levofloxacin]     arthralgia     Outpatient Medications Prior to Visit  Medication Sig Dispense Refill   acetaminophen (TYLENOL) 500 MG tablet Take 500 mg by mouth every 6 (six) hours as needed.     albuterol (VENTOLIN HFA) 108 (90 Base) MCG/ACT inhaler Inhale 2 puffs into the lungs every 4 (four) hours as needed for wheezing or shortness of breath. 1 each 0   atorvastatin (LIPITOR) 10 MG tablet Take 1 tablet (10 mg total) by mouth daily. 90 tablet 3   bismuth subsalicylate (PEPTO BISMOL) 262 MG/15ML suspension Take 15 mLs by mouth every 6 (six) hours as needed for indigestion or diarrhea or loose stools.     cetirizine (ZYRTEC) 10 MG tablet Take 1 tablet (10 mg total) by mouth daily. 90 tablet 3   Fexofenadine HCl (ALLEGRA ALLERGY PO) Take by mouth.     fluticasone (FLONASE) 50 MCG/ACT nasal spray Place 2 sprays into both nostrils daily. 16 g 6   Fluticasone-Umeclidin-Vilant (TRELEGY ELLIPTA) 100-62.5-25 MCG/ACT AEPB Inhale 1 puff into the lungs daily. 14 each 0   Fluticasone-Umeclidin-Vilant (TRELEGY ELLIPTA) 100-62.5-25 MCG/ACT AEPB Inhale 1 puff into the lungs daily. 14 each 0   montelukast (SINGULAIR) 10 MG tablet Take 1 tablet (10 mg total) by mouth at bedtime. 30 tablet 3   Multiple Vitamin (MULTIVITAMIN WITH MINERALS) TABS tablet Take 1 tablet by mouth daily.     Multiple Vitamins-Minerals (HAIR SKIN & NAILS PO) Take by mouth.     nicotine (NICODERM CQ - DOSED IN MG/24 HOURS) 21 mg/24hr patch Place 1 patch (21 mg total) onto the skin daily. 28 patch 1   pantoprazole (PROTONIX) 40 MG tablet Take 1 tablet (40 mg total) by mouth 2 (two) times daily. 60 tablet 3   predniSONE (STERAPRED UNI-PAK 21 TAB) 10 MG (21) TBPK tablet Take as directed 1 each 0   pregabalin (LYRICA) 75 MG capsule Take 1 capsule (75 mg total) by mouth 2 (two) times daily. 84 capsule 0   SYNTHROID 150 MCG tablet Take one po qd for six days, do not take on day 7 90 tablet 3   vitamin  B-12 (CYANOCOBALAMIN) 500 MCG tablet Take 500 mcg by mouth daily.     No facility-administered medications prior to visit.       Objective:   Physical Exam:  General appearance: 62 y.o., female, NAD, conversant  Eyes: anicteric sclerae; PERRL, tracking appropriately HENT: NCAT; MMM Neck: Trachea midline;  no lymphadenopathy, no JVD Lungs: CTAB, no crackles, no wheeze, with normal respiratory effort CV: RRR, no murmur  Abdomen: Soft, non-tender; non-distended, BS present  Extremities: No peripheral edema, warm Skin: Normal turgor and texture; no rash Psych: Appropriate affect Neuro: Alert and oriented to person and place, no focal deficit     There were no vitals filed for this visit.     on RA BMI Readings from Last 3 Encounters:  11/28/22 26.11 kg/m  11/26/22 26.26 kg/m  10/30/22 25.92 kg/m   Wt Readings from Last 3 Encounters:  11/28/22 138 lb 3.2 oz (62.7 kg)  11/26/22 139 lb (63 kg)  10/30/22 137 lb 3.2 oz (62.2 kg)     CBC    Component Value Date/Time   WBC 6.7 11/28/2022 1100   RBC 4.39 11/28/2022 1100   HGB 13.8 11/28/2022 1100   HCT 41.8 11/28/2022 1100   PLT 324.0 11/28/2022 1100   MCV 95.3 11/28/2022 1100   MCH 32.8 02/01/2022 1800   MCHC 32.9 11/28/2022 1100   RDW 12.8 11/28/2022 1100   LYMPHSABS 1.5 02/01/2022 1800   MONOABS 1.0 02/01/2022 1800   EOSABS 0.0 02/01/2022 1800   BASOSABS 0.0 02/01/2022 1800    Eos 0-200  Chest Imaging: CTA Chest 02/04/22 reviewed by me with upper lobe predominant emphysema, bronchial wall thickening  Pulmonary Functions Testing Results:    Latest Ref Rng & Units 11/20/2022   11:24 AM  PFT Results  FVC-Pre L 1.75   FVC-Predicted Pre % 60   FVC-Post L 1.81   FVC-Predicted Post % 61   Pre FEV1/FVC % % 70   Post FEV1/FCV % % 72   FEV1-Pre L 1.23   FEV1-Predicted Pre % 54   FEV1-Post L 1.30   DLCO uncorrected ml/min/mmHg 15.29   DLCO UNC% % 84   DLCO corrected ml/min/mmHg 15.29   DLCO COR %Predicted  % 84   DLVA Predicted % 98   TLC L 3.91   TLC % Predicted % 85   RV % Predicted % 101    PFT 11/20/22 with moderately severe obstruction     Assessment & Plan:   # Emphysema # Suspected COPD Gold B Didn't really notice any improvement from stiolto, actually prefers her albuterol to it  # 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 Declines chantix, patches. Will try gum OTC.   Plan: - will price out inhalers for you - try trelegy 1 puff once daily, rinse mouth and brush teeth/tongue after use - would use neil med neti pot with distilled water or water that's been boiled then cooled, clear nose of crusting then flonase 1 spray each nostril. Ok to continue singulair as well. - albuterol 1-2 puffs as needed - Sinus CT and referral to ENT - you will be called to schedule CT Chest for lung cancer screening - see you in 3 months or sooner if need be!     Omar Person, MD [facility] 12/15/2022 3:52 PM

## 2022-12-17 ENCOUNTER — Encounter: Payer: Self-pay | Admitting: Student

## 2022-12-17 ENCOUNTER — Ambulatory Visit (INDEPENDENT_AMBULATORY_CARE_PROVIDER_SITE_OTHER): Payer: 59 | Admitting: Student

## 2022-12-17 ENCOUNTER — Telehealth: Payer: Self-pay | Admitting: Student

## 2022-12-17 VITALS — BP 124/68 | HR 78 | Temp 97.9°F | Ht 61.0 in | Wt 137.0 lb

## 2022-12-17 DIAGNOSIS — J329 Chronic sinusitis, unspecified: Secondary | ICD-10-CM | POA: Diagnosis not present

## 2022-12-17 DIAGNOSIS — F17200 Nicotine dependence, unspecified, uncomplicated: Secondary | ICD-10-CM

## 2022-12-17 DIAGNOSIS — J449 Chronic obstructive pulmonary disease, unspecified: Secondary | ICD-10-CM | POA: Diagnosis not present

## 2022-12-17 NOTE — Patient Instructions (Addendum)
-   will price out inhalers for you - try trelegy 1 puff once daily, rinse mouth and brush teeth/tongue after use - would use neil med neti pot with distilled water or water that's been boiled then cooled, clear nose of crusting then flonase 1 spray each nostril. Ok to continue singulair as well. - albuterol 1-2 puffs as needed - if you need multiple courses of steroids this year or if you have trouble breathing or cough despite use of your daily inhaler then call to make an appointment!

## 2022-12-17 NOTE — Progress Notes (Signed)
Pulmonary Preoperative Evaluation  Ms. Jaeger has upcoming neck surgery. For Ms. Xu, risk of perioperative pulmonary complications is increased by:  [ ] Age greater than 65 years  [X]  COPD  [ ]  Serum albumin <3.5  [X]  Smoking  [ ]  Obstructive sleep apnea  [ ]  NYHA Class II Pulmonary Hypertension  Respiratory complications generally occur in 1% of ASA Class I patients, 5% of ASA Class II and 10% of ASA Class III-IV patients These complications rarely result in mortality and include postoperative pneumonia, atelectasis, pulmonary embolism, ARDS and increased time requiring postoperative mechanical ventilation. She is low risk for pulmonary complications for a low-moderate risk procedure.     Overall, I recommend proceeding with the surgery if the risk for respiratory complications are outweighed by the potential benefits. This will need to be discussed between the patient and surgeon.  To reduce risks of respiratory complications, I recommend: --Smoking cessation 6-8 weeks if possible if patient is an active smoker --Pre- and post-operative incentive spirometry performed frequently while awake --Avoiding use of pancuronium during anesthesia. --Early implementation of DVT chemoppx when acceptable from surgical wound healing standpoint --Encourage mobility and incentive spirometry post-op  For any questions or concerns, please contact our office.

## 2022-12-17 NOTE — Telephone Encounter (Signed)
What is least expensive triple therapy option for her? Thanks!

## 2022-12-18 ENCOUNTER — Other Ambulatory Visit (HOSPITAL_COMMUNITY): Payer: Self-pay

## 2022-12-18 NOTE — Telephone Encounter (Signed)
Patient has a high deductible making Trelegy $343.51 at this time. Markus Daft is not covered under the plan currently.

## 2023-01-07 ENCOUNTER — Inpatient Hospital Stay (HOSPITAL_COMMUNITY): Admission: RE | Admit: 2023-01-07 | Payer: 59 | Source: Ambulatory Visit

## 2023-01-15 ENCOUNTER — Ambulatory Visit: Admit: 2023-01-15 | Payer: 59 | Admitting: Neurological Surgery

## 2023-01-15 SURGERY — ANTERIOR CERVICAL DECOMPRESSION/DISCECTOMY FUSION 2 LEVELS
Anesthesia: General

## 2023-01-25 NOTE — Therapy (Unsigned)
OUTPATIENT PHYSICAL THERAPY CERVICAL EVALUATION   Patient Name: Lauren Beck MRN: [id number] DOB:Feb 19, 1961, 62 y.o., female Today's Date: 01/26/2023  END OF SESSION:  PT End of Session - 01/26/23 1022     Visit Number 1    Number of Visits 12    Date for PT Re-Evaluation 03/09/23    Authorization Type [employer]    PT Start Time 1015    PT Stop Time 1105    PT Time Calculation (min) 50 min    Activity Tolerance Patient tolerated treatment well;Patient limited by pain    Behavior During Therapy WFL for tasks assessed/performed             Past Medical History:  Diagnosis Date   Allergic rhinitis    Arthritis    Bronchitis    COPD (chronic obstructive pulmonary disease) (HCC)    Excessive or frequent menstruation    GERD (gastroesophageal reflux disease)    History of migraines    Hypothyroidism    Nonspecific abnormal electrocardiogram (ECG) (EKG)    Pure hypercholesterolemia    Tobacco use disorder    Unspecified adjustment reaction    Past Surgical History:  Procedure Laterality Date   HAMMER TOE SURGERY Bilateral    PELVIC LAPAROSCOPY     exploratory, thought might have a cyst, not seen   TUBAL LIGATION     Patient Active Problem List   Diagnosis Date Noted   Shortened PR interval 11/28/2022   Non-seasonal allergic rhinitis 07/24/2022   COPD (chronic obstructive pulmonary disease) with chronic bronchitis 05/12/2022   Prediabetes 05/12/2022   Intermittent claudication (HCC) 05/12/2022   DOE (dyspnea on exertion) 02/04/2022   Cervical spondylitis with radiculitis (HCC) 02/04/2022   Neuralgia 02/04/2022   Postmenopausal 11/14/2021   Mixed hyperlipidemia 11/14/2021   Current smoker 11/14/2021   Acquired hypothyroidism 11/14/2021   Gastroesophageal reflux disease 11/14/2021   TOBACCO ABUSE 03/16/2007    PCP: [name] FNP  REFERRING PROVIDER: Dawley, Alan Mulder, DO  REFERRING DIAG:   THERAPY DIAG:  Cervicalgia  Muscle weakness  (generalized)  Bilateral shoulder pain, unspecified chronicity  Rationale for Evaluation and Treatment: Rehabilitation  ONSET DATE: chronic   SUBJECTIVE:                                                                                                                                                                                                         SUBJECTIVE STATEMENT: Patient has neck pain and arm pain, which is ongoing for about 3 years, worsening for the past 6 months.  She reports she has  done PT in the past with little to no benefit.  She was also told at that time that she needed surgery.  She is here because she is unable to get surgery without trying physical therapy first.   She has difficulty using her arms for dressing, showering.  She has nerve damage in her hands and endorses weakness in her hands/arms.  She has good days and bad.  "When I can't do anything I get depressed" She has pain with any amount of cervical motion.  Her head feels heavy. The pain radiates to her LUE more than the Rt. She has difficulty lifting arms overhead, lifting to shoulder height.    Hand dominance: Right  PERTINENT HISTORY:   Chronic pain COPD, smoker  PAIN:  Are you having pain? Yes: NPRS scale: 8/10 Pain location: post neck, arms Pain description: tight, heavy  Aggravating factors: activity  Relieving factors: nothing really  PRECAUTIONS: None  RED FLAGS: None     WEIGHT BEARING RESTRICTIONS: No  FALLS:  Has patient fallen in last 6 months? No She says she occasionally feels off balance if she gets up quick and steadies her self.  LIVING ENVIRONMENT: Lives with: lives with their spouse Lives in: Mobile home Stairs:  no issues Has following equipment at home: None  OCCUPATION: not working  PLOF: Independent with basic ADLs and Independent with community mobility without device  PATIENT GOALS: I want to get surgery  NEXT MD VISIT: post PT?   OBJECTIVE:   DIAGNOSTIC  FINDINGS:  10/2022: Mild progression since 2022 of chronic disc and endplate degeneration at C5-C6 and C6-C7. Bulky rightward disc osteophyte complex at C5-C6. Mild spinal stenosis with up to mild cord mass effect at both levels. No cord signal abnormality. Moderate to severe bilateral C6 and moderate left C7 neural foraminal stenosis. Query Right C6 radiculitis. 2. Stable similar chronic disc and endplate degeneration at C4-C5 with similar mild spinal stenosis, moderate to severe left greater than right C5 neural foraminal stenosis.  PATIENT SURVEYS:  FOTO 32%  COGNITION: Overall cognitive status: Within functional limits for tasks assessed  SENSATION: Within functional limits tested today.  Patient does report numbness and tingling in hands at times  POSTURE: rounded shoulders, forward head, and guarded  PALPATION: Unable to tolerate even light touch to bilateral upper arms scapular region upper traps posterior cervicals   CERVICAL ROM:   Active ROM A/PROM (deg) eval  Flexion 20  Extension 20  Right lateral flexion 22  Left lateral flexion 18  Right rotation 30  Left rotation 25   (Blank rows = not tested)  UPPER EXTREMITY ROM:  Active ROM Right eval Left eval  Shoulder flexion 50 deg 50 deg  Shoulder extension 20 deg 10 deg  Shoulder abduction 45 45  Shoulder adduction    Shoulder internal rotation Pain with PROM, 20-30 deg Pain PROM, 20-30  Shoulder external rotation Pain with PROM 20-30 Pain with PROM  20-30  Elbow flexion    Elbow extension Pain  Pain   Wrist flexion    Wrist extension    Wrist ulnar deviation    Wrist radial deviation    Wrist pronation    Wrist supination Pain  Pain    (Blank rows = not tested)  UPPER EXTREMITY MMT:  MMT Right eval Left eval  Shoulder flexion 3- 3-  Shoulder extension    Shoulder abduction 2+ 2+  Shoulder adduction    Shoulder extension    Shoulder internal rotation  Shoulder external rotation     Middle trapezius    Lower trapezius    Elbow flexion    Elbow extension    Wrist flexion    Wrist extension    Wrist ulnar deviation    Wrist radial deviation    Wrist pronation    Wrist supination    Grip strength 5 4   (Blank rows = not tested)  CERVICAL SPECIAL TESTS:  NT due to pain   FUNCTIONAL TESTS:  5 times sit to stand: 40 sec   TODAY'S TREATMENT:                                                                                                                              DATE: 01/26/23    [facility] Adult PT Treatment:                                                DATE: 01/26/23 Therapeutic Exercise: Supine LR with head turns Chin tuck Scapular squeeze AROM bilateral UE flexion with dowel L stretch standing  Self Care: POC, heat vs ice, shoulder pain and result of immobility, chronic pain     PATIENT EDUCATION:  Education details: POC, HEP , eval findings  Person educated: Patient Education method: Explanation, Demonstration, Tactile cues, and Verbal cues Education comprehension: verbalized understanding and needs further education  HOME EXERCISE PROGRAM: Access Code: [email or web address] URL: [email or web address] Date: 01/26/2023 Prepared by: Karie Mainland  Exercises - Supine Lower Trunk Rotation  - 1-2 x daily - 7 x weekly - 2 sets - 10 reps - 10 hold - Supine Cervical Retraction with Towel  - 1-2 x daily - 7 x weekly - 2 sets - 10 reps - 5 hold - Seated Scapular Retraction  - 1-2 x daily - 7 x weekly - 2 sets - 10 reps - 10 hold - Standing 'L' Stretch at Counter  - 1-2 x daily - 7 x weekly - 1 sets - 5 reps - 30 hold  ASSESSMENT:  CLINICAL IMPRESSION: Patient is a 62y.o. female who was seen today for physical therapy evaluation and treatment for cervical spondylitis. She presents with significant pain and weakness.  Pain with Passive shoulder ROM including externa rotation, indicating possible shoulder issue vs secondary pain from prolonged immobility. If  she is no better in 6 visits, will refer back to Neurosurgery but encouraged PT and reinforced the need to move even if she has surgery.   OBJECTIVE IMPAIRMENTS: Abnormal gait, decreased activity tolerance, decreased mobility, difficulty walking, decreased ROM, decreased strength, increased fascial restrictions, increased muscle spasms, impaired flexibility, impaired UE functional use, improper body mechanics, postural dysfunction, and pain.   ACTIVITY LIMITATIONS: carrying, lifting, bending, sleeping, stairs, bed mobility, dressing, reach over head, hygiene/grooming, and locomotion level  PARTICIPATION LIMITATIONS: meal prep, cleaning, laundry, driving, shopping, community  activity, and yard work  PERSONAL FACTORS: Behavior pattern, Past/current experiences, Social background, Time since onset of injury/illness/exacerbation, and 1-2 comorbidities: COPD, chronic pain  are also affecting patient's functional outcome.   REHAB POTENTIAL: Good  CLINICAL DECISION MAKING: Evolving/moderate complexity  EVALUATION COMPLEXITY: Moderate   GOALS: Goals reviewed with patient? Yes   LONG TERM GOALS: Target date: 03/09/2023    Patient will be independent with home exercise program for cervical spine and bilateral shoulders Baseline: Unknown Goal status: INITIAL  2.  Patient will be able to raise arms overhead with min increase in pain for more functional lifting at home Baseline: Pain even below shoulder height Goal status: INITIAL  3.  Patient will be able to sit to stand x 5 and 20 seconds or less Baseline: 40 seconds Goal status: INITIAL  4.  Patient will identify 3 self-care strategies for reducing pain at home Baseline: unknown Goal status: INITIAL  5.  Foto score will improve to 49% better Baseline: 32% Goal status: INITIAL  PLAN:  PT FREQUENCY: 2x/week   PT DURATION: 6 weeks patient will attend 6-8 visits and if no better will discharge  PLANNED INTERVENTIONS: Therapeutic  exercises, Therapeutic activity, Neuromuscular re-education, Balance training, Gait training, Patient/Family education, Self Care, Joint mobilization, Electrical stimulation, Spinal mobilization, Cryotherapy, Moist heat, Manual therapy, and Re-evaluation  PLAN FOR NEXT SESSION: Check HEP, progress bilateral UE AROM , manual as tolerated    Saylee Sherrill, PT 01/26/2023, 11:44 AM  Karie Mainland, PT 01/26/23 11:54 AM Phone: [phone] Fax: [phone]

## 2023-01-26 ENCOUNTER — Ambulatory Visit: Payer: 59 | Attending: Neurological Surgery | Admitting: Physical Therapy

## 2023-01-26 DIAGNOSIS — M6281 Muscle weakness (generalized): Secondary | ICD-10-CM | POA: Diagnosis present

## 2023-01-26 DIAGNOSIS — M542 Cervicalgia: Secondary | ICD-10-CM | POA: Diagnosis not present

## 2023-01-26 DIAGNOSIS — M25512 Pain in left shoulder: Secondary | ICD-10-CM | POA: Insufficient documentation

## 2023-01-26 DIAGNOSIS — M25511 Pain in right shoulder: Secondary | ICD-10-CM | POA: Diagnosis present

## 2023-01-28 ENCOUNTER — Encounter: Payer: Self-pay | Admitting: Physical Therapy

## 2023-01-28 ENCOUNTER — Ambulatory Visit: Payer: 59 | Admitting: Physical Therapy

## 2023-01-28 DIAGNOSIS — M6281 Muscle weakness (generalized): Secondary | ICD-10-CM

## 2023-01-28 DIAGNOSIS — M542 Cervicalgia: Secondary | ICD-10-CM

## 2023-01-28 DIAGNOSIS — M25512 Pain in left shoulder: Secondary | ICD-10-CM

## 2023-01-28 DIAGNOSIS — M25511 Pain in right shoulder: Secondary | ICD-10-CM

## 2023-01-28 NOTE — Therapy (Signed)
OUTPATIENT PHYSICAL THERAPY CERVICAL EVALUATION   Patient Name: Lauren Beck MRN: [id number] DOB:04-24-1961, 62 y.o., female Today's Date: 01/28/2023  END OF SESSION:  PT End of Session - 01/28/23 1020     Visit Number 2    Number of Visits 12    Date for PT Re-Evaluation 03/09/23    Authorization Type [employer]et[employer]    PT Start Time 1017    PT Stop Time 1059    PT Time Calculation (min) 42 min             Past Medical History:  Diagnosis Date   Allergic rhinitis    Arthritis    Bronchitis    COPD (chronic obstructive pulmonary disease) (HCC)    Excessive or frequent menstruation    GERD (gastroesophageal reflux disease)    History of migraines    Hypothyroidism    Nonspecific abnormal electrocardiogram (ECG) (EKG)    Pure hypercholesterolemia    Tobacco use disorder    Unspecified adjustment reaction    Past Surgical History:  Procedure Laterality Date   HAMMER TOE SURGERY Bilateral    PELVIC LAPAROSCOPY     exploratory, thought might have a cyst, not seen   TUBAL LIGATION     Patient Active Problem List   Diagnosis Date Noted   Shortened PR interval 11/28/2022   Non-seasonal allergic rhinitis 07/24/2022   COPD (chronic obstructive pulmonary disease) with chronic bronchitis 05/12/2022   Prediabetes 05/12/2022   Intermittent claudication (HCC) 05/12/2022   DOE (dyspnea on exertion) 02/04/2022   Cervical spondylitis with radiculitis (HCC) 02/04/2022   Neuralgia 02/04/2022   Postmenopausal 11/14/2021   Mixed hyperlipidemia 11/14/2021   Current smoker 11/14/2021   Acquired hypothyroidism 11/14/2021   Gastroesophageal reflux disease 11/14/2021   TOBACCO ABUSE 03/16/2007    PCP: [name] FNP  REFERRING PROVIDER: Dawley, Alan Mulder, DO  REFERRING DIAG:   THERAPY DIAG:  Cervicalgia  Muscle weakness (generalized)  Bilateral shoulder pain, unspecified chronicity  Rationale for Evaluation and Treatment: Rehabilitation  ONSET DATE: chronic   SUBJECTIVE:                                                                                                                                                                                                          SUBJECTIVE STATEMENT: Pt reports that she looks down and feels like a neck bone will pop out of her skin. She also reports pain in her right groin thigh and buttock today causing her to limp. She reports compliance with HEP.   EVAL: Patient has neck pain and  arm pain, which is ongoing for about 3 years, worsening for the past 6 months.  She reports she has done PT in the past with little to no benefit.  She was also told at that time that she needed surgery.  She is here because she is unable to get surgery without trying physical therapy first.   She has difficulty using her arms for dressing, showering.  She has nerve damage in her hands and endorses weakness in her hands/arms.  She has good days and bad.  "When I can't do anything I get depressed" She has pain with any amount of cervical motion.  Her head feels heavy. The pain radiates to her LUE more than the Rt. She has difficulty lifting arms overhead, lifting to shoulder height.    Hand dominance: Right  PERTINENT HISTORY:   Chronic pain COPD, smoker  PAIN:  Are you having pain? Yes: NPRS scale: 8-10/10 Pain location: post neck, arms Pain description: tight, heavy  Aggravating factors: activity  Relieving factors: nothing really  PRECAUTIONS: None  RED FLAGS: None     WEIGHT BEARING RESTRICTIONS: No  FALLS:  Has patient fallen in last 6 months? No She says she occasionally feels off balance if she gets up quick and steadies her self.  LIVING ENVIRONMENT: Lives with: lives with their spouse Lives in: Mobile home Stairs:  no issues Has following equipment at home: None  OCCUPATION: not working  PLOF: Independent with basic ADLs and Independent with community mobility without device  PATIENT GOALS: I want to get  surgery  NEXT MD VISIT: post PT?   OBJECTIVE:   DIAGNOSTIC FINDINGS:  10/2022: Mild progression since 2022 of chronic disc and endplate degeneration at C5-C6 and C6-C7. Bulky rightward disc osteophyte complex at C5-C6. Mild spinal stenosis with up to mild cord mass effect at both levels. No cord signal abnormality. Moderate to severe bilateral C6 and moderate left C7 neural foraminal stenosis. Query Right C6 radiculitis. 2. Stable similar chronic disc and endplate degeneration at C4-C5 with similar mild spinal stenosis, moderate to severe left greater than right C5 neural foraminal stenosis.  PATIENT SURVEYS:  FOTO 32%  COGNITION: Overall cognitive status: Within functional limits for tasks assessed  SENSATION: Within functional limits tested today.  Patient does report numbness and tingling in hands at times  POSTURE: rounded shoulders, forward head, and guarded  PALPATION: Unable to tolerate even light touch to bilateral upper arms scapular region upper traps posterior cervicals   CERVICAL ROM:   Active ROM A/PROM (deg) eval  Flexion 20  Extension 20  Right lateral flexion 22  Left lateral flexion 18  Right rotation 30  Left rotation 25   (Blank rows = not tested)  UPPER EXTREMITY ROM:  Active ROM Right eval Left eval  Shoulder flexion 50 deg 50 deg  Shoulder extension 20 deg 10 deg  Shoulder abduction 45 45  Shoulder adduction    Shoulder internal rotation Pain with PROM, 20-30 deg Pain PROM, 20-30  Shoulder external rotation Pain with PROM 20-30 Pain with PROM  20-30  Elbow flexion    Elbow extension Pain  Pain   Wrist flexion    Wrist extension    Wrist ulnar deviation    Wrist radial deviation    Wrist pronation    Wrist supination Pain  Pain    (Blank rows = not tested)  UPPER EXTREMITY MMT:  MMT Right eval Left eval  Shoulder flexion 3- 3-  Shoulder extension  Shoulder abduction 2+ 2+  Shoulder adduction    Shoulder extension     Shoulder internal rotation    Shoulder external rotation    Middle trapezius    Lower trapezius    Elbow flexion    Elbow extension    Wrist flexion    Wrist extension    Wrist ulnar deviation    Wrist radial deviation    Wrist pronation    Wrist supination    Grip strength 5 4   (Blank rows = not tested)  CERVICAL SPECIAL TESTS:  NT due to pain   FUNCTIONAL TESTS:  5 times sit to stand: 40 sec   TODAY'S TREATMENT:                                                                                                                              [facility] Adult PT Treatment:                                                DATE: 01/28/23 Therapeutic Exercise: Seated scap retract L stretch at counter Supine chin tuck gentle  Supine cervical AROM rotation  Supine dowel pressup to pullover  Supine shoulder ER AAROM LTR with opp head turn   Modalities: HMP to cervical x 10 minutes      DATE: 01/26/23    [facility] Adult PT Treatment:                                                DATE: 01/26/23 Therapeutic Exercise: Supine LR with head turns Chin tuck Scapular squeeze AROM bilateral UE flexion with dowel L stretch standing  Self Care: POC, heat vs ice, shoulder pain and result of immobility, chronic pain     PATIENT EDUCATION:  Education details: POC, HEP , eval findings  Person educated: Patient Education method: Explanation, Demonstration, Tactile cues, and Verbal cues Education comprehension: verbalized understanding and needs further education  HOME EXERCISE PROGRAM: Access Code: [email or web address] URL: [email or web address] Date: 01/26/2023 Prepared by: Karie Mainland  Exercises - Supine Lower Trunk Rotation  - 1-2 x daily - 7 x weekly - 2 sets - 10 reps - 10 hold - Supine Cervical Retraction with Towel  - 1-2 x daily - 7 x weekly - 2 sets - 10 reps - 5 hold - Seated Scapular Retraction  - 1-2 x daily - 7 x weekly - 2 sets - 10 reps - 10 hold - Standing 'L' Stretch at  Counter  - 1-2 x daily - 7 x weekly - 1 sets - 5 reps - 30 hold  ASSESSMENT:  CLINICAL IMPRESSION: Pt reports compliance with HEP, continued pain and symptoms reported at eval. Also reports  feeling a bone with pop out of neck when she looks down. Reviewed HEP and progressed with Shoulder ER AAROM.  She reports she can feel pulling in her upper arms with shoulder ROM therex. Trial of HMP to cervical for pain management/self care education. She experiences increased pain with all movements and was cautioned not to push herself into pain, keep exercises comfortable. She reported HMP felt good at end of session. No change in overall pain.    EVAL: Patient is a 62y.o. female who was seen today for physical therapy evaluation and treatment for cervical spondylitis. She presents with significant pain and weakness.  Pain with Passive shoulder ROM including externa rotation, indicating possible shoulder issue vs secondary pain from prolonged immobility. If she is no better in 6 visits, will refer back to Neurosurgery but encouraged PT and reinforced the need to move even if she has surgery.   OBJECTIVE IMPAIRMENTS: Abnormal gait, decreased activity tolerance, decreased mobility, difficulty walking, decreased ROM, decreased strength, increased fascial restrictions, increased muscle spasms, impaired flexibility, impaired UE functional use, improper body mechanics, postural dysfunction, and pain.   ACTIVITY LIMITATIONS: carrying, lifting, bending, sleeping, stairs, bed mobility, dressing, reach over head, hygiene/grooming, and locomotion level  PARTICIPATION LIMITATIONS: meal prep, cleaning, laundry, driving, shopping, community activity, and yard work  PERSONAL FACTORS: Behavior pattern, Past/current experiences, Social background, Time since onset of injury/illness/exacerbation, and 1-2 comorbidities: COPD, chronic pain  are also affecting patient's functional outcome.   REHAB POTENTIAL: Good  CLINICAL  DECISION MAKING: Evolving/moderate complexity  EVALUATION COMPLEXITY: Moderate   GOALS: Goals reviewed with patient? Yes   LONG TERM GOALS: Target date: 03/09/2023    Patient will be independent with home exercise program for cervical spine and bilateral shoulders Baseline: Unknown Goal status: INITIAL  2.  Patient will be able to raise arms overhead with min increase in pain for more functional lifting at home Baseline: Pain even below shoulder height Goal status: INITIAL  3.  Patient will be able to sit to stand x 5 and 20 seconds or less Baseline: 40 seconds Goal status: INITIAL  4.  Patient will identify 3 self-care strategies for reducing pain at home Baseline: unknown Goal status: INITIAL  5.  Foto score will improve to 49% better Baseline: 32% Goal status: INITIAL  PLAN:  PT FREQUENCY: 2x/week   PT DURATION: 6 weeks patient will attend 6-8 visits and if no better will discharge  PLANNED INTERVENTIONS: Therapeutic exercises, Therapeutic activity, Neuromuscular re-education, Balance training, Gait training, Patient/Family education, Self Care, Joint mobilization, Electrical stimulation, Spinal mobilization, Cryotherapy, Moist heat, Manual therapy, and Re-evaluation  PLAN FOR NEXT SESSION: Check HEP, progress bilateral UE AROM , manual as tolerated    Jannette Spanner, PTA 01/28/23 12:52 PM Phone: [phone] Fax: [phone]

## 2023-02-03 ENCOUNTER — Encounter: Payer: 59 | Admitting: Physical Therapy

## 2023-02-06 ENCOUNTER — Encounter: Payer: 59 | Admitting: Physical Therapy

## 2023-02-09 ENCOUNTER — Ambulatory Visit: Payer: 59 | Admitting: Physical Therapy

## 2023-02-09 NOTE — Therapy (Deleted)
OUTPATIENT PHYSICAL THERAPY CERVICAL EVALUATION   Patient Name: Lauren Beck MRN: [id number] DOB:05-Sep-1960, 62 y.o., female Today's Date: 02/09/2023  END OF SESSION:    Past Medical History:  Diagnosis Date   Allergic rhinitis    Arthritis    Bronchitis    COPD (chronic obstructive pulmonary disease) (HCC)    Excessive or frequent menstruation    GERD (gastroesophageal reflux disease)    History of migraines    Hypothyroidism    Nonspecific abnormal electrocardiogram (ECG) (EKG)    Pure hypercholesterolemia    Tobacco use disorder    Unspecified adjustment reaction    Past Surgical History:  Procedure Laterality Date   HAMMER TOE SURGERY Bilateral    PELVIC LAPAROSCOPY     exploratory, thought might have a cyst, not seen   TUBAL LIGATION     Patient Active Problem List   Diagnosis Date Noted   Shortened PR interval 11/28/2022   Non-seasonal allergic rhinitis 07/24/2022   COPD (chronic obstructive pulmonary disease) with chronic bronchitis 05/12/2022   Prediabetes 05/12/2022   Intermittent claudication (HCC) 05/12/2022   DOE (dyspnea on exertion) 02/04/2022   Cervical spondylitis with radiculitis (HCC) 02/04/2022   Neuralgia 02/04/2022   Postmenopausal 11/14/2021   Mixed hyperlipidemia 11/14/2021   Current smoker 11/14/2021   Acquired hypothyroidism 11/14/2021   Gastroesophageal reflux disease 11/14/2021   TOBACCO ABUSE 03/16/2007    PCP: [name] FNP  REFERRING PROVIDER: Dawley, Alan Mulder, DO  REFERRING DIAG:   THERAPY DIAG:  No diagnosis found.  Rationale for Evaluation and Treatment: Rehabilitation  ONSET DATE: chronic   SUBJECTIVE:                                                                                                                                                                                                         SUBJECTIVE STATEMENT: Pt reports that she looks down and feels like a neck bone will pop out of her skin. She also  reports pain in her right groin thigh and buttock today causing her to limp. She reports compliance with HEP.   EVAL: Patient has neck pain and arm pain, which is ongoing for about 3 years, worsening for the past 6 months.  She reports she has done PT in the past with little to no benefit.  She was also told at that time that she needed surgery.  She is here because she is unable to get surgery without trying physical therapy first.   She has difficulty using her arms for dressing, showering.  She has nerve damage in her  hands and endorses weakness in her hands/arms.  She has good days and bad.  "When I can't do anything I get depressed" She has pain with any amount of cervical motion.  Her head feels heavy. The pain radiates to her LUE more than the Rt. She has difficulty lifting arms overhead, lifting to shoulder height.    Hand dominance: Right  PERTINENT HISTORY:   Chronic pain COPD, smoker  PAIN:  Are you having pain? Yes: NPRS scale: 8-10/10 Pain location: post neck, arms Pain description: tight, heavy  Aggravating factors: activity  Relieving factors: nothing really  PRECAUTIONS: None  RED FLAGS: None     WEIGHT BEARING RESTRICTIONS: No  FALLS:  Has patient fallen in last 6 months? No She says she occasionally feels off balance if she gets up quick and steadies her self.  LIVING ENVIRONMENT: Lives with: lives with their spouse Lives in: Mobile home Stairs:  no issues Has following equipment at home: None  OCCUPATION: not working  PLOF: Independent with basic ADLs and Independent with community mobility without device  PATIENT GOALS: I want to get surgery  NEXT MD VISIT: post PT?   OBJECTIVE:   DIAGNOSTIC FINDINGS:  10/2022: Mild progression since 2022 of chronic disc and endplate degeneration at C5-C6 and C6-C7. Bulky rightward disc osteophyte complex at C5-C6. Mild spinal stenosis with up to mild cord mass effect at both levels. No cord signal abnormality.  Moderate to severe bilateral C6 and moderate left C7 neural foraminal stenosis. Query Right C6 radiculitis. 2. Stable similar chronic disc and endplate degeneration at C4-C5 with similar mild spinal stenosis, moderate to severe left greater than right C5 neural foraminal stenosis.  PATIENT SURVEYS:  FOTO 32%  COGNITION: Overall cognitive status: Within functional limits for tasks assessed  SENSATION: Within functional limits tested today.  Patient does report numbness and tingling in hands at times  POSTURE: rounded shoulders, forward head, and guarded  PALPATION: Unable to tolerate even light touch to bilateral upper arms scapular region upper traps posterior cervicals   CERVICAL ROM:   Active ROM A/PROM (deg) eval  Flexion 20  Extension 20  Right lateral flexion 22  Left lateral flexion 18  Right rotation 30  Left rotation 25   (Blank rows = not tested)  UPPER EXTREMITY ROM:  Active ROM Right eval Left eval  Shoulder flexion 50 deg 50 deg  Shoulder extension 20 deg 10 deg  Shoulder abduction 45 45  Shoulder adduction    Shoulder internal rotation Pain with PROM, 20-30 deg Pain PROM, 20-30  Shoulder external rotation Pain with PROM 20-30 Pain with PROM  20-30  Elbow flexion    Elbow extension Pain  Pain   Wrist flexion    Wrist extension    Wrist ulnar deviation    Wrist radial deviation    Wrist pronation    Wrist supination Pain  Pain    (Blank rows = not tested)  UPPER EXTREMITY MMT:  MMT Right eval Left eval  Shoulder flexion 3- 3-  Shoulder extension    Shoulder abduction 2+ 2+  Shoulder adduction    Shoulder extension    Shoulder internal rotation    Shoulder external rotation    Middle trapezius    Lower trapezius    Elbow flexion    Elbow extension    Wrist flexion    Wrist extension    Wrist ulnar deviation    Wrist radial deviation    Wrist pronation    Wrist supination  Grip strength 5 4   (Blank rows = not  tested)  CERVICAL SPECIAL TESTS:  NT due to pain   FUNCTIONAL TESTS:  5 times sit to stand: 40 sec   TODAY'S TREATMENT:         [facility] Adult PT Treatment:                                                DATE: 02/09/23 Therapeutic Exercise: *** Manual Therapy: *** Neuromuscular re-ed: *** Therapeutic Activity: *** Modalities: *** Self Care: ***                                                                                                                        [facility] PT Treatment:                                                DATE: 01/28/23 Therapeutic Exercise: Seated scap retract L stretch at counter Supine chin tuck gentle  Supine cervical AROM rotation  Supine dowel pressup to pullover  Supine shoulder ER AAROM LTR with opp head turn   Modalities: HMP to cervical x 10 minutes      DATE: 01/26/23    [facility] Adult PT Treatment:                                                DATE: 01/26/23 Therapeutic Exercise: Supine LR with head turns Chin tuck Scapular squeeze AROM bilateral UE flexion with dowel L stretch standing  Self Care: POC, heat vs ice, shoulder pain and result of immobility, chronic pain     PATIENT EDUCATION:  Education details: POC, HEP , eval findings  Person educated: Patient Education method: Explanation, Demonstration, Tactile cues, and Verbal cues Education comprehension: verbalized understanding and needs further education  HOME EXERCISE PROGRAM: Access Code: [email or web address] URL: [email or web address] Date: 01/26/2023 Prepared by: Karie Mainland  Exercises - Supine Lower Trunk Rotation  - 1-2 x daily - 7 x weekly - 2 sets - 10 reps - 10 hold - Supine Cervical Retraction with Towel  - 1-2 x daily - 7 x weekly - 2 sets - 10 reps - 5 hold - Seated Scapular Retraction  - 1-2 x daily - 7 x weekly - 2 sets - 10 reps - 10 hold - Standing 'L' Stretch at Counter  - 1-2 x daily - 7 x weekly - 1 sets - 5 reps - 30  hold  ASSESSMENT:  CLINICAL IMPRESSION: Pt reports compliance with HEP, continued pain and symptoms reported at eval. Also reports feeling a bone with pop out of  neck when she looks down. Reviewed HEP and progressed with Shoulder ER AAROM.  She reports she can feel pulling in her upper arms with shoulder ROM therex. Trial of HMP to cervical for pain management/self care education. She experiences increased pain with all movements and was cautioned not to push herself into pain, keep exercises comfortable. She reported HMP felt good at end of session. No change in overall pain.    EVAL: Patient is a 62y.o. female who was seen today for physical therapy evaluation and treatment for cervical spondylitis. She presents with significant pain and weakness.  Pain with Passive shoulder ROM including externa rotation, indicating possible shoulder issue vs secondary pain from prolonged immobility. If she is no better in 6 visits, will refer back to Neurosurgery but encouraged PT and reinforced the need to move even if she has surgery.   OBJECTIVE IMPAIRMENTS: Abnormal gait, decreased activity tolerance, decreased mobility, difficulty walking, decreased ROM, decreased strength, increased fascial restrictions, increased muscle spasms, impaired flexibility, impaired UE functional use, improper body mechanics, postural dysfunction, and pain.   ACTIVITY LIMITATIONS: carrying, lifting, bending, sleeping, stairs, bed mobility, dressing, reach over head, hygiene/grooming, and locomotion level  PARTICIPATION LIMITATIONS: meal prep, cleaning, laundry, driving, shopping, community activity, and yard work  PERSONAL FACTORS: Behavior pattern, Past/current experiences, Social background, Time since onset of injury/illness/exacerbation, and 1-2 comorbidities: COPD, chronic pain  are also affecting patient's functional outcome.   REHAB POTENTIAL: Good  CLINICAL DECISION MAKING: Evolving/moderate complexity  EVALUATION  COMPLEXITY: Moderate   GOALS: Goals reviewed with patient? Yes   LONG TERM GOALS: Target date: 03/09/2023    Patient will be independent with home exercise program for cervical spine and bilateral shoulders Baseline: Unknown Goal status: INITIAL  2.  Patient will be able to raise arms overhead with min increase in pain for more functional lifting at home Baseline: Pain even below shoulder height Goal status: INITIAL  3.  Patient will be able to sit to stand x 5 and 20 seconds or less Baseline: 40 seconds Goal status: INITIAL  4.  Patient will identify 3 self-care strategies for reducing pain at home Baseline: unknown Goal status: INITIAL  5.  Foto score will improve to 49% better Baseline: 32% Goal status: INITIAL  PLAN:  PT FREQUENCY: 2x/week   PT DURATION: 6 weeks patient will attend 6-8 visits and if no better will discharge  PLANNED INTERVENTIONS: Therapeutic exercises, Therapeutic activity, Neuromuscular re-education, Balance training, Gait training, Patient/Family education, Self Care, Joint mobilization, Electrical stimulation, Spinal mobilization, Cryotherapy, Moist heat, Manual therapy, and Re-evaluation  PLAN FOR NEXT SESSION: Check HEP, progress bilateral UE AROM , manual as tolerated    Jannette Spanner, PTA 02/09/23 7:47 AM Phone: [phone] Fax: [phone]

## 2023-02-11 ENCOUNTER — Ambulatory Visit: Payer: 59 | Admitting: Physical Therapy

## 2023-02-16 ENCOUNTER — Ambulatory Visit: Payer: 59 | Admitting: Physical Therapy

## 2023-02-16 ENCOUNTER — Encounter: Payer: Self-pay | Admitting: Physical Therapy

## 2023-02-16 DIAGNOSIS — M542 Cervicalgia: Secondary | ICD-10-CM | POA: Diagnosis not present

## 2023-02-16 DIAGNOSIS — M25511 Pain in right shoulder: Secondary | ICD-10-CM

## 2023-02-16 DIAGNOSIS — M6281 Muscle weakness (generalized): Secondary | ICD-10-CM

## 2023-02-16 NOTE — Therapy (Signed)
OUTPATIENT PHYSICAL THERAPY TREATMENT   Patient Name: Lauren Beck MRN: [id number] DOB:May 13, 1961, 62 y.o., female Today's Date: 02/16/2023  END OF SESSION:  PT End of Session - 02/16/23 0935     Visit Number 3    Number of Visits 12    Date for PT Re-Evaluation 03/09/23    Authorization Type [employer]    PT Start Time [phone]    PT Stop Time 1015    PT Time Calculation (min) 41 min    Activity Tolerance Patient tolerated treatment well;Patient limited by pain    Behavior During Therapy WFL for tasks assessed/performed              Past Medical History:  Diagnosis Date   Allergic rhinitis    Arthritis    Bronchitis    COPD (chronic obstructive pulmonary disease) (HCC)    Excessive or frequent menstruation    GERD (gastroesophageal reflux disease)    History of migraines    Hypothyroidism    Nonspecific abnormal electrocardiogram (ECG) (EKG)    Pure hypercholesterolemia    Tobacco use disorder    Unspecified adjustment reaction    Past Surgical History:  Procedure Laterality Date   HAMMER TOE SURGERY Bilateral    PELVIC LAPAROSCOPY     exploratory, thought might have a cyst, not seen   TUBAL LIGATION     Patient Active Problem List   Diagnosis Date Noted   Shortened PR interval 11/28/2022   Non-seasonal allergic rhinitis 07/24/2022   COPD (chronic obstructive pulmonary disease) with chronic bronchitis 05/12/2022   Prediabetes 05/12/2022   Intermittent claudication (HCC) 05/12/2022   DOE (dyspnea on exertion) 02/04/2022   Cervical spondylitis with radiculitis (HCC) 02/04/2022   Neuralgia 02/04/2022   Postmenopausal 11/14/2021   Mixed hyperlipidemia 11/14/2021   Current smoker 11/14/2021   Acquired hypothyroidism 11/14/2021   Gastroesophageal reflux disease 11/14/2021   TOBACCO ABUSE 03/16/2007    PCP: [name] FNP  REFERRING PROVIDER: Dawley, Alan Mulder, DO  REFERRING DIAG:   THERAPY DIAG:  Cervicalgia  Muscle weakness (generalized)  Bilateral  shoulder pain, unspecified chronicity  Rationale for Evaluation and Treatment: Rehabilitation  ONSET DATE: chronic   SUBJECTIVE:                                                                                                                                                                                                         SUBJECTIVE STATEMENT:  She has severe neck pain. She and her husband both had Covid.  She now complaints of light dizziness for about 2 weeks. The  symptoms vary, can be dizzy with changing positions.  Pt does report overall a slight improvement in her arm mobility.   Hand dominance: Right  PERTINENT HISTORY:   Chronic pain COPD, smoker  PAIN:  Are you having pain? Yes: NPRS scale: 8-10/10 Pain location: post neck, arms Pain description: tight, heavy  Aggravating factors: activity  Relieving factors: nothing really  PRECAUTIONS: None  RED FLAGS: None     WEIGHT BEARING RESTRICTIONS: No  FALLS:  Has patient fallen in last 6 months? No She says she occasionally feels off balance if she gets up quick and steadies her self.  LIVING ENVIRONMENT: Lives with: lives with their spouse Lives in: Mobile home Stairs:  no issues Has following equipment at home: None  OCCUPATION: not working  PLOF: Independent with basic ADLs and Independent with community mobility without device  PATIENT GOALS: I want to get surgery  NEXT MD VISIT: post PT?   OBJECTIVE:   DIAGNOSTIC FINDINGS:  10/2022: Mild progression since 2022 of chronic disc and endplate degeneration at C5-C6 and C6-C7. Bulky rightward disc osteophyte complex at C5-C6. Mild spinal stenosis with up to mild cord mass effect at both levels. No cord signal abnormality. Moderate to severe bilateral C6 and moderate left C7 neural foraminal stenosis. Query Right C6 radiculitis. 2. Stable similar chronic disc and endplate degeneration at C4-C5 with similar mild spinal stenosis, moderate to severe left  greater than right C5 neural foraminal stenosis.  PATIENT SURVEYS:  FOTO 32%  COGNITION: Overall cognitive status: Within functional limits for tasks assessed  SENSATION: Within functional limits tested today.  Patient does report numbness and tingling in hands at times  POSTURE: rounded shoulders, forward head, and guarded  PALPATION: Unable to tolerate even light touch to bilateral upper arms scapular region upper traps posterior cervicals   CERVICAL ROM:   Active ROM A/PROM (deg) eval  Flexion 20  Extension 20  Right lateral flexion 22  Left lateral flexion 18  Right rotation 30  Left rotation 25   (Blank rows = not tested)  UPPER EXTREMITY ROM:  Active ROM Right eval Left eval  Shoulder flexion 50 deg 50 deg  Shoulder extension 20 deg 10 deg  Shoulder abduction 45 45  Shoulder adduction    Shoulder internal rotation Pain with PROM, 20-30 deg Pain PROM, 20-30  Shoulder external rotation Pain with PROM 20-30 Pain with PROM  20-30  Elbow flexion    Elbow extension Pain  Pain   Wrist flexion    Wrist extension    Wrist ulnar deviation    Wrist radial deviation    Wrist pronation    Wrist supination Pain  Pain    (Blank rows = not tested)  UPPER EXTREMITY MMT:  MMT Right eval Left eval  Shoulder flexion 3- 3-  Shoulder extension    Shoulder abduction 2+ 2+  Shoulder adduction    Shoulder extension    Shoulder internal rotation    Shoulder external rotation    Middle trapezius    Lower trapezius    Elbow flexion    Elbow extension    Wrist flexion    Wrist extension    Wrist ulnar deviation    Wrist radial deviation    Wrist pronation    Wrist supination    Grip strength 5 4   (Blank rows = not tested)  CERVICAL SPECIAL TESTS:  NT due to pain   FUNCTIONAL TESTS:  5 times sit to stand: 40 sec   TODAY'S  TREATMENT:         [facility] Adult PT Treatment:                                                DATE: 02/16/23 Therapeutic Exercise: Supine  light cervical ROM: rotation and chin tuck Shoulder flexion with dowel AAROM  x 10 x 2 , chest press  Shoulder ER/IR with dowel AROM x 10 x 2  LTR with opp head turn x 10  Seated scap retraction  Seated row red band x 15  Seated cervical Manual Therapy: Passive ROM  Light soft tissue work to bilateral post cervicals, upper trap  Gentle sub-occ release                                                                                                                           [facility] Adult PT Treatment:                                                DATE: 01/28/23 Therapeutic Exercise: Seated scap retract L stretch at counter Supine chin tuck gentle  Supine cervical AROM rotation  Supine dowel pressup to pullover  Supine shoulder ER AAROM LTR with opp head turn   Modalities: HMP to cervical x 10 minutes      DATE: 01/26/23    [facility] Adult PT Treatment:                                                DATE: 01/26/23 Therapeutic Exercise: Supine LR with head turns Chin tuck Scapular squeeze AROM bilateral UE flexion with dowel L stretch standing  Self Care: POC, heat vs ice, shoulder pain and result of immobility, chronic pain     PATIENT EDUCATION:  Education details: POC, HEP , eval findings  Person educated: Patient Education method: Explanation, Demonstration, Tactile cues, and Verbal cues Education comprehension: verbalized understanding and needs further education  HOME EXERCISE PROGRAM: Access Code: [email or web address] URL: [email or web address] Date: 01/26/2023 Prepared by: Karie Mainland  Exercises - Supine Lower Trunk Rotation  - 1-2 x daily - 7 x weekly - 2 sets - 10 reps - 10 hold - Supine Cervical Retraction with Towel  - 1-2 x daily - 7 x weekly - 2 sets - 10 reps - 5 hold - Seated Scapular Retraction  - 1-2 x daily - 7 x weekly - 2 sets - 10 reps - 10 hold - Standing 'L' Stretch at Counter  - 1-2 x daily - 7 x weekly - 1 sets - 5 reps - 30  hold  ASSESSMENT:  CLINICAL IMPRESSION: Patient continues to have bilateral neck and arm pain.  She was out with Covid but has been able to do a few of her exercises as she is feeling better.  She has pain with cervical retraction, shoulder ROM.  Modified exercises to accommodate. Added strength with bands to HEP.     EVAL: Patient is a 62y.o. female who was seen today for physical therapy evaluation and treatment for cervical spondylitis. She presents with significant pain and weakness.  Pain with Passive shoulder ROM including externa rotation, indicating possible shoulder issue vs secondary pain from prolonged immobility. If she is no better in 6 visits, will refer back to Neurosurgery but encouraged PT and reinforced the need to move even if she has surgery.   OBJECTIVE IMPAIRMENTS: Abnormal gait, decreased activity tolerance, decreased mobility, difficulty walking, decreased ROM, decreased strength, increased fascial restrictions, increased muscle spasms, impaired flexibility, impaired UE functional use, improper body mechanics, postural dysfunction, and pain.   ACTIVITY LIMITATIONS: carrying, lifting, bending, sleeping, stairs, bed mobility, dressing, reach over head, hygiene/grooming, and locomotion level  PARTICIPATION LIMITATIONS: meal prep, cleaning, laundry, driving, shopping, community activity, and yard work  PERSONAL FACTORS: Behavior pattern, Past/current experiences, Social background, Time since onset of injury/illness/exacerbation, and 1-2 comorbidities: COPD, chronic pain  are also affecting patient's functional outcome.   REHAB POTENTIAL: Good  CLINICAL DECISION MAKING: Evolving/moderate complexity  EVALUATION COMPLEXITY: Moderate   GOALS: Goals reviewed with patient? Yes   LONG TERM GOALS: Target date: 03/09/2023    Patient will be independent with home exercise program for cervical spine and bilateral shoulders Baseline: Unknown Goal status: ongoing   2.   Patient will be able to raise arms overhead with min increase in pain for more functional lifting at home Baseline: Pain even below shoulder height Goal status: ongoing   3.  Patient will be able to sit to stand x 5 and 20 seconds or less Baseline: 40 seconds Goal status:ongoing   4.  Patient will identify 3 self-care strategies for reducing pain at home Baseline: unknown Goal status: ongoing   5.  Foto score will improve to 49% better Baseline: 32% Goal status: ongoing  PLAN:  PT FREQUENCY: 2x/week   PT DURATION: 6 weeks patient will attend 6-8 visits and if no better will discharge  PLANNED INTERVENTIONS: Therapeutic exercises, Therapeutic activity, Neuromuscular re-education, Balance training, Gait training, Patient/Family education, Self Care, Joint mobilization, Electrical stimulation, Spinal mobilization, Cryotherapy, Moist heat, Manual therapy, and Re-evaluation  PLAN FOR NEXT SESSION: Check HEP, progress bilateral UE AROM , manual as tolerated   Karie Mainland, PT 02/16/23 1:15 PM Phone: [phone] Fax: [phone]

## 2023-02-17 NOTE — Therapy (Unsigned)
OUTPATIENT PHYSICAL THERAPY TREATMENT   Patient Name: Lauren Beck MRN: [id number] DOB:1961-01-02, 62 y.o., female Today's Date: 02/18/2023  END OF SESSION:  PT End of Session - 02/18/23 0934     Visit Number 4    Number of Visits 12    Date for PT Re-Evaluation 03/09/23    Authorization Type [employer]    PT Start Time 0930    PT Stop Time 1015    PT Time Calculation (min) 45 min    Activity Tolerance Patient tolerated treatment well;Patient limited by pain    Behavior During Therapy WFL for tasks assessed/performed               Past Medical History:  Diagnosis Date   Allergic rhinitis    Arthritis    Bronchitis    COPD (chronic obstructive pulmonary disease) (HCC)    Excessive or frequent menstruation    GERD (gastroesophageal reflux disease)    History of migraines    Hypothyroidism    Nonspecific abnormal electrocardiogram (ECG) (EKG)    Pure hypercholesterolemia    Tobacco use disorder    Unspecified adjustment reaction    Past Surgical History:  Procedure Laterality Date   HAMMER TOE SURGERY Bilateral    PELVIC LAPAROSCOPY     exploratory, thought might have a cyst, not seen   TUBAL LIGATION     Patient Active Problem List   Diagnosis Date Noted   Shortened PR interval 11/28/2022   Non-seasonal allergic rhinitis 07/24/2022   COPD (chronic obstructive pulmonary disease) with chronic bronchitis 05/12/2022   Prediabetes 05/12/2022   Intermittent claudication (HCC) 05/12/2022   DOE (dyspnea on exertion) 02/04/2022   Cervical spondylitis with radiculitis (HCC) 02/04/2022   Neuralgia 02/04/2022   Postmenopausal 11/14/2021   Mixed hyperlipidemia 11/14/2021   Current smoker 11/14/2021   Acquired hypothyroidism 11/14/2021   Gastroesophageal reflux disease 11/14/2021   TOBACCO ABUSE 03/16/2007    PCP: [name] FNP  REFERRING PROVIDER: Dawley, Alan Mulder, DO  REFERRING DIAG:   THERAPY DIAG:  Cervicalgia  Muscle weakness (generalized)  Bilateral  shoulder pain, unspecified chronicity  Rationale for Evaluation and Treatment: Rehabilitation  ONSET DATE: chronic   SUBJECTIVE:                                                                                                                                                                                                         SUBJECTIVE STATEMENT:  My neck feels a bit better, its a 5/10-6/10. The massage helped the neck for about an hour.  Headache yesterday.  Hand dominance: Right  PERTINENT HISTORY:   Chronic pain COPD, smoker  PAIN:  Are you having pain? Yes: NPRS scale: 5/10-6/10 Pain location: post neck, arms Pain description: tight, heavy  Aggravating factors: activity  Relieving factors: nothing really  PRECAUTIONS: None  RED FLAGS: None     WEIGHT BEARING RESTRICTIONS: No  FALLS:  Has patient fallen in last 6 months? No She says she occasionally feels off balance if she gets up quick and steadies her self.  LIVING ENVIRONMENT: Lives with: lives with their spouse Lives in: Mobile home Stairs:  no issues Has following equipment at home: None  OCCUPATION: not working  PLOF: Independent with basic ADLs and Independent with community mobility without device  PATIENT GOALS: I want to get surgery  NEXT MD VISIT: post PT?   OBJECTIVE:   DIAGNOSTIC FINDINGS:  10/2022: Mild progression since 2022 of chronic disc and endplate degeneration at C5-C6 and C6-C7. Bulky rightward disc osteophyte complex at C5-C6. Mild spinal stenosis with up to mild cord mass effect at both levels. No cord signal abnormality. Moderate to severe bilateral C6 and moderate left C7 neural foraminal stenosis. Query Right C6 radiculitis. 2. Stable similar chronic disc and endplate degeneration at C4-C5 with similar mild spinal stenosis, moderate to severe left greater than right C5 neural foraminal stenosis.  PATIENT SURVEYS:  FOTO 32%  COGNITION: Overall cognitive status:  Within functional limits for tasks assessed  SENSATION: Within functional limits tested today.  Patient does report numbness and tingling in hands at times  POSTURE: rounded shoulders, forward head, and guarded  PALPATION: Unable to tolerate even light touch to bilateral upper arms scapular region upper traps posterior cervicals   CERVICAL ROM:   Active ROM A/PROM (deg) eval  Flexion 20  Extension 20  Right lateral flexion 22  Left lateral flexion 18  Right rotation 30  Left rotation 25   (Blank rows = not tested)  UPPER EXTREMITY ROM:  Active ROM Right eval Left eval  Shoulder flexion 50 deg 50 deg  Shoulder extension 20 deg 10 deg  Shoulder abduction 45 45  Shoulder adduction    Shoulder internal rotation Pain with PROM, 20-30 deg Pain PROM, 20-30  Shoulder external rotation Pain with PROM 20-30 Pain with PROM  20-30  Elbow flexion    Elbow extension Pain  Pain   Wrist flexion    Wrist extension    Wrist ulnar deviation    Wrist radial deviation    Wrist pronation    Wrist supination Pain  Pain    (Blank rows = not tested)  UPPER EXTREMITY MMT:  MMT Right eval Left eval Rt./Lt.  02/18/23  Shoulder flexion 3- 3-   Shoulder extension     Shoulder abduction 2+ 2+   Shoulder adduction     Shoulder extension     Shoulder internal rotation   Rt 4/5  Shoulder external rotation   Rt 3+/5  Middle trapezius     Lower trapezius     Elbow flexion     Elbow extension     Wrist flexion     Wrist extension     Wrist ulnar deviation     Wrist radial deviation     Wrist pronation     Wrist supination     Grip strength 5 4    (Blank rows = not tested)  CERVICAL SPECIAL TESTS:  NT due to pain   FUNCTIONAL TESTS:  5 times sit to stand: 40 sec  TODAY'S TREATMENT:        [facility] Adult PT Treatment:                                                DATE: 02/18/23 Therapeutic Exercise: Pulleys flexion and scaption 2 min each  UBE 4 min L1 , 2 min each  direction Supine LTR with head turns Cervical flexion/extension, rotation with towel roll x 10  Scapular protract/retraction arms up then resting on table Arm arcs with cervical retraction x 10 , mod for Rt shoulder pain  ER unattached red band x 15  ER with arms supported on table (lower trap) Wall slides for shoulder flexion cues for neck  Wall with ball behind mid back for scapular retraction  Arm Arcs  with ball x 10  Goal post x 10  High row green band x 15  Manual Therapy: PROM bilateral UEs    [facility] Adult PT Treatment:                                                DATE: 02/16/23 Therapeutic Exercise: Supine light cervical ROM: rotation and chin tuck Shoulder flexion with dowel AAROM  x 10 x 2 , chest press  Shoulder ER/IR with dowel AROM x 10 x 2  LTR with opp head turn x 10  Seated scap retraction  Seated row red band x 15  Seated cervical Manual Therapy: Passive ROM  Light soft tissue work to bilateral post cervicals, upper trap  Gentle sub-occ release                                                                                                                           [facility] Adult PT Treatment:                                                DATE: 01/28/23 Therapeutic Exercise: Seated scap retract L stretch at counter Supine chin tuck gentle  Supine cervical AROM rotation  Supine dowel pressup to pullover  Supine shoulder ER AAROM LTR with opp head turn   Modalities: HMP to cervical x 10 minutes   [facility] Adult PT Treatment:                                                DATE: 01/26/23 Therapeutic Exercise: Supine LR with head turns Chin tuck Scapular squeeze AROM bilateral UE flexion with dowel L stretch standing  Self Care: POC, heat vs ice, shoulder pain and result of immobility, chronic pain     PATIENT EDUCATION:  Education details: POC, HEP , eval findings  Person educated: Patient Education method: Explanation, Demonstration, Tactile cues, and  Verbal cues Education comprehension: verbalized understanding and needs further education  HOME EXERCISE PROGRAM: Access Code: [email or web address] URL: [email or web address] Date: 02/18/2023 Prepared by: Karie Mainland  Exercises - Supine Lower Trunk Rotation  - 1-2 x daily - 7 x weekly - 2 sets - 10 reps - 10 hold - Supine Cervical Retraction with Towel  - 1-2 x daily - 7 x weekly - 2 sets - 10 reps - 5 hold - Seated Scapular Retraction  - 1-2 x daily - 7 x weekly - 2 sets - 10 reps - 10 hold - Standing 'L' Stretch at Counter  - 1-2 x daily - 7 x weekly - 1 sets - 5 reps - 30 hold - Shoulder External Rotation and Scapular Retraction with Resistance  - 1 x daily - 7 x weekly - 2 sets - 10 reps - 5 hold - Standing Shoulder Row with Anchored Resistance  - 1 x daily - 7 x weekly - 2 sets - 10 reps - 5 hold - Shoulder extension with resistance - Neutral  - 1 x daily - 7 x weekly - 2 sets - 10 reps - 5 hold  ASSESSMENT:  CLINICAL IMPRESSION:    Patient reporting less pain overall in neck.  Pt with more symptoms on Rt side of her neck and Rt shoulder.  Pain cont to have bilateral shoulder pain especially with supine shoulder exercises. She does not currently have an orthopedic doctor but she does acknowldge she has a shoulder issue as well.  She will cont to benefit from PT to reduce symptoms.     EVAL: Patient is a 62y.o. female who was seen today for physical therapy evaluation and treatment for cervical spondylitis. She presents with significant pain and weakness.  Pain with Passive shoulder ROM including external rotation, indicating possible shoulder issue vs secondary pain from prolonged immobility. If she is no better in 6 visits, will refer back to Neurosurgery but encouraged PT and reinforced the need to move even if she has surgery.   OBJECTIVE IMPAIRMENTS: Abnormal gait, decreased activity tolerance, decreased mobility, difficulty walking, decreased ROM, decreased strength, increased  fascial restrictions, increased muscle spasms, impaired flexibility, impaired UE functional use, improper body mechanics, postural dysfunction, and pain.   ACTIVITY LIMITATIONS: carrying, lifting, bending, sleeping, stairs, bed mobility, dressing, reach over head, hygiene/grooming, and locomotion level  PARTICIPATION LIMITATIONS: meal prep, cleaning, laundry, driving, shopping, community activity, and yard work  PERSONAL FACTORS: Behavior pattern, Past/current experiences, Social background, Time since onset of injury/illness/exacerbation, and 1-2 comorbidities: COPD, chronic pain  are also affecting patient's functional outcome.   REHAB POTENTIAL: Good  CLINICAL DECISION MAKING: Evolving/moderate complexity  EVALUATION COMPLEXITY: Moderate   GOALS: Goals reviewed with patient? Yes   LONG TERM GOALS: Target date: 03/09/2023    Patient will be independent with home exercise program for cervical spine and bilateral shoulders Baseline: Unknown Goal status: ongoing   2.  Patient will be able to raise arms overhead with min increase in pain for more functional lifting at home Baseline: Pain even below shoulder height Goal status: ongoing   3.  Patient will be able to sit to stand x 5 and 20 seconds or less Baseline: 40 seconds Goal status:ongoing   4.  Patient will identify 3 self-care  strategies for reducing pain at home Baseline: unknown Goal status: ongoing   5.  Foto score will improve to 49% better Baseline: 32% Goal status: ongoing  PLAN:  PT FREQUENCY: 2x/week   PT DURATION: 6 weeks patient will attend 6-8 visits and if no better will discharge  PLANNED INTERVENTIONS: Therapeutic exercises, Therapeutic activity, Neuromuscular re-education, Balance training, Gait training, Patient/Family education, Self Care, Joint mobilization, Electrical stimulation, Spinal mobilization, Cryotherapy, Moist heat, Manual therapy, and Re-evaluation  PLAN FOR NEXT SESSION: Check HEP,  progress bilateral UE AROM , manual as tolerated   Karie Mainland, PT 02/18/23 1:30 PM Phone: [phone] Fax: [phone]

## 2023-02-18 ENCOUNTER — Encounter: Payer: Self-pay | Admitting: Physical Therapy

## 2023-02-18 ENCOUNTER — Ambulatory Visit: Payer: 59 | Admitting: Physical Therapy

## 2023-02-18 DIAGNOSIS — M542 Cervicalgia: Secondary | ICD-10-CM

## 2023-02-18 DIAGNOSIS — M25511 Pain in right shoulder: Secondary | ICD-10-CM

## 2023-02-18 DIAGNOSIS — M6281 Muscle weakness (generalized): Secondary | ICD-10-CM

## 2023-02-23 NOTE — Therapy (Deleted)
OUTPATIENT PHYSICAL THERAPY TREATMENT   Patient Name: Lauren Beck MRN: [id number] DOB:[birth year], 61 y.o., female Today's Date: 02/23/2023  END OF SESSION:      Past Medical History:  Diagnosis Date   Allergic rhinitis    Arthritis    Bronchitis    COPD (chronic obstructive pulmonary disease) (HCC)    Excessive or frequent menstruation    GERD (gastroesophageal reflux disease)    History of migraines    Hypothyroidism    Nonspecific abnormal electrocardiogram (ECG) (EKG)    Pure hypercholesterolemia    Tobacco use disorder    Unspecified adjustment reaction    Past Surgical History:  Procedure Laterality Date   HAMMER TOE SURGERY Bilateral    PELVIC LAPAROSCOPY     exploratory, thought might have a cyst, not seen   TUBAL LIGATION     Patient Active Problem List   Diagnosis Date Noted   Shortened PR interval 11/28/2022   Non-seasonal allergic rhinitis 07/24/2022   COPD (chronic obstructive pulmonary disease) with chronic bronchitis 05/12/2022   Prediabetes 05/12/2022   Intermittent claudication (HCC) 05/12/2022   DOE (dyspnea on exertion) 02/04/2022   Cervical spondylitis with radiculitis (HCC) 02/04/2022   Neuralgia 02/04/2022   Postmenopausal 11/14/2021   Mixed hyperlipidemia 11/14/2021   Current smoker 11/14/2021   Acquired hypothyroidism 11/14/2021   Gastroesophageal reflux disease 11/14/2021   TOBACCO ABUSE 03/16/2007    PCP: [name] FNP  REFERRING PROVIDER: Dawley, Alan Mulder, DO  REFERRING DIAG:   THERAPY DIAG:  No diagnosis found.  Rationale for Evaluation and Treatment: Rehabilitation  ONSET DATE: chronic   SUBJECTIVE:                                                                                                                                                                                                         SUBJECTIVE STATEMENT:  My neck feels a bit better, its a 5/10-6/10. The massage helped the neck for about an hour.  Headache  yesterday.    Hand dominance: Right  PERTINENT HISTORY:   Chronic pain COPD, smoker  PAIN:  Are you having pain? Yes: NPRS scale: 5/10-6/10 Pain location: post neck, arms Pain description: tight, heavy  Aggravating factors: activity  Relieving factors: nothing really  PRECAUTIONS: None  RED FLAGS: None     WEIGHT BEARING RESTRICTIONS: No  FALLS:  Has patient fallen in last 6 months? No She says she occasionally feels off balance if she gets up quick and steadies her self.  LIVING ENVIRONMENT: Lives with: lives with their spouse Lives in: Mobile home Stairs:  no issues Has following equipment at home: None  OCCUPATION: not working  PLOF: Independent with basic ADLs and Independent with community mobility without device  PATIENT GOALS: I want to get surgery  NEXT MD VISIT: post PT?   OBJECTIVE:   DIAGNOSTIC FINDINGS:  10/2022: Mild progression since 2022 of chronic disc and endplate degeneration at C5-C6 and C6-C7. Bulky rightward disc osteophyte complex at C5-C6. Mild spinal stenosis with up to mild cord mass effect at both levels. No cord signal abnormality. Moderate to severe bilateral C6 and moderate left C7 neural foraminal stenosis. Query Right C6 radiculitis. 2. Stable similar chronic disc and endplate degeneration at C4-C5 with similar mild spinal stenosis, moderate to severe left greater than right C5 neural foraminal stenosis.  PATIENT SURVEYS:  FOTO 32%  COGNITION: Overall cognitive status: Within functional limits for tasks assessed  SENSATION: Within functional limits tested today.  Patient does report numbness and tingling in hands at times  POSTURE: rounded shoulders, forward head, and guarded  PALPATION: Unable to tolerate even light touch to bilateral upper arms scapular region upper traps posterior cervicals   CERVICAL ROM:   Active ROM A/PROM (deg) eval  Flexion 20  Extension 20  Right lateral flexion 22  Left lateral flexion  18  Right rotation 30  Left rotation 25   (Blank rows = not tested)  UPPER EXTREMITY ROM:  Active ROM Right eval Left eval  Shoulder flexion 50 deg 50 deg  Shoulder extension 20 deg 10 deg  Shoulder abduction 45 45  Shoulder adduction    Shoulder internal rotation Pain with PROM, 20-30 deg Pain PROM, 20-30  Shoulder external rotation Pain with PROM 20-30 Pain with PROM  20-30  Elbow flexion    Elbow extension Pain  Pain   Wrist flexion    Wrist extension    Wrist ulnar deviation    Wrist radial deviation    Wrist pronation    Wrist supination Pain  Pain    (Blank rows = not tested)  UPPER EXTREMITY MMT:  MMT Right eval Left eval Rt./Lt.  02/18/23  Shoulder flexion 3- 3-   Shoulder extension     Shoulder abduction 2+ 2+   Shoulder adduction     Shoulder extension     Shoulder internal rotation   Rt 4/5  Shoulder external rotation   Rt 3+/5  Middle trapezius     Lower trapezius     Elbow flexion     Elbow extension     Wrist flexion     Wrist extension     Wrist ulnar deviation     Wrist radial deviation     Wrist pronation     Wrist supination     Grip strength 5 4    (Blank rows = not tested)  CERVICAL SPECIAL TESTS:  NT due to pain   FUNCTIONAL TESTS:  5 times sit to stand: 40 sec   TODAY'S TREATMENT:        [facility] Adult PT Treatment:                                                DATE: 02/18/23 Therapeutic Exercise: Pulleys flexion and scaption 2 min each  UBE 4 min L1 , 2 min each direction Supine LTR with head turns Cervical flexion/extension, rotation with towel roll x 10  Scapular protract/retraction arms  up then resting on table Arm arcs with cervical retraction x 10 , mod for Rt shoulder pain  ER unattached red band x 15  ER with arms supported on table (lower trap) Wall slides for shoulder flexion cues for neck  Wall with ball behind mid back for scapular retraction  Arm Arcs  with ball x 10  Goal post x 10  High row green band x 15   Manual Therapy: PROM bilateral UEs    [facility] Adult PT Treatment:                                                DATE: 02/16/23 Therapeutic Exercise: Supine light cervical ROM: rotation and chin tuck Shoulder flexion with dowel AAROM  x 10 x 2 , chest press  Shoulder ER/IR with dowel AROM x 10 x 2  LTR with opp head turn x 10  Seated scap retraction  Seated row red band x 15  Seated cervical Manual Therapy: Passive ROM  Light soft tissue work to bilateral post cervicals, upper trap  Gentle sub-occ release                                                                                                                           [facility] Adult PT Treatment:                                                DATE: 01/28/23 Therapeutic Exercise: Seated scap retract L stretch at counter Supine chin tuck gentle  Supine cervical AROM rotation  Supine dowel pressup to pullover  Supine shoulder ER AAROM LTR with opp head turn   Modalities: HMP to cervical x 10 minutes   [facility] Adult PT Treatment:                                                DATE: 01/26/23 Therapeutic Exercise: Supine LR with head turns Chin tuck Scapular squeeze AROM bilateral UE flexion with dowel L stretch standing  Self Care: POC, heat vs ice, shoulder pain and result of immobility, chronic pain     PATIENT EDUCATION:  Education details: POC, HEP , eval findings  Person educated: Patient Education method: Explanation, Demonstration, Tactile cues, and Verbal cues Education comprehension: verbalized understanding and needs further education  HOME EXERCISE PROGRAM: Access Code: [email or web address] URL: [email or web address] Date: 02/18/2023 Prepared by: Karie Mainland  Exercises - Supine Lower Trunk Rotation  - 1-2 x daily - 7 x weekly - 2 sets - 10 reps - 10 hold - Supine Cervical Retraction with Towel  -  1-2 x daily - 7 x weekly - 2 sets - 10 reps - 5 hold - Seated Scapular Retraction  - 1-2 x daily - 7 x weekly  - 2 sets - 10 reps - 10 hold - Standing 'L' Stretch at Counter  - 1-2 x daily - 7 x weekly - 1 sets - 5 reps - 30 hold - Shoulder External Rotation and Scapular Retraction with Resistance  - 1 x daily - 7 x weekly - 2 sets - 10 reps - 5 hold - Standing Shoulder Row with Anchored Resistance  - 1 x daily - 7 x weekly - 2 sets - 10 reps - 5 hold - Shoulder extension with resistance - Neutral  - 1 x daily - 7 x weekly - 2 sets - 10 reps - 5 hold  ASSESSMENT:  CLINICAL IMPRESSION:    Patient reporting less pain overall in neck.  Pt with more symptoms on Rt side of her neck and Rt shoulder.  Pain cont to have bilateral shoulder pain especially with supine shoulder exercises. She does not currently have an orthopedic doctor but she does acknowldge she has a shoulder issue as well.  She will cont to benefit from PT to reduce symptoms.     EVAL: Patient is a 62y.o. female who was seen today for physical therapy evaluation and treatment for cervical spondylitis. She presents with significant pain and weakness.  Pain with Passive shoulder ROM including external rotation, indicating possible shoulder issue vs secondary pain from prolonged immobility. If she is no better in 6 visits, will refer back to Neurosurgery but encouraged PT and reinforced the need to move even if she has surgery.   OBJECTIVE IMPAIRMENTS: Abnormal gait, decreased activity tolerance, decreased mobility, difficulty walking, decreased ROM, decreased strength, increased fascial restrictions, increased muscle spasms, impaired flexibility, impaired UE functional use, improper body mechanics, postural dysfunction, and pain.   ACTIVITY LIMITATIONS: carrying, lifting, bending, sleeping, stairs, bed mobility, dressing, reach over head, hygiene/grooming, and locomotion level  PARTICIPATION LIMITATIONS: meal prep, cleaning, laundry, driving, shopping, community activity, and yard work  PERSONAL FACTORS: Behavior pattern, Past/current  experiences, Social background, Time since onset of injury/illness/exacerbation, and 1-2 comorbidities: COPD, chronic pain  are also affecting patient's functional outcome.   REHAB POTENTIAL: Good  CLINICAL DECISION MAKING: Evolving/moderate complexity  EVALUATION COMPLEXITY: Moderate   GOALS: Goals reviewed with patient? Yes   LONG TERM GOALS: Target date: 03/09/2023    Patient will be independent with home exercise program for cervical spine and bilateral shoulders Baseline: Unknown Goal status: ongoing   2.  Patient will be able to raise arms overhead with min increase in pain for more functional lifting at home Baseline: Pain even below shoulder height Goal status: ongoing   3.  Patient will be able to sit to stand x 5 and 20 seconds or less Baseline: 40 seconds Goal status:ongoing   4.  Patient will identify 3 self-care strategies for reducing pain at home Baseline: unknown Goal status: ongoing   5.  Foto score will improve to 49% better Baseline: 32% Goal status: ongoing  PLAN:  PT FREQUENCY: 2x/week   PT DURATION: 6 weeks patient will attend 6-8 visits and if no better will discharge  PLANNED INTERVENTIONS: Therapeutic exercises, Therapeutic activity, Neuromuscular re-education, Balance training, Gait training, Patient/Family education, Self Care, Joint mobilization, Electrical stimulation, Spinal mobilization, Cryotherapy, Moist heat, Manual therapy, and Re-evaluation  PLAN FOR NEXT SESSION: Check HEP, progress bilateral UE AROM , manual as tolerated  Karie Mainland, PT 02/23/23 6:55 PM Phone: [phone] Fax: [phone]

## 2023-02-24 ENCOUNTER — Ambulatory Visit: Payer: 59 | Attending: Neurological Surgery | Admitting: Physical Therapy

## 2023-02-24 ENCOUNTER — Telehealth: Payer: Self-pay | Admitting: Physical Therapy

## 2023-02-24 DIAGNOSIS — M25512 Pain in left shoulder: Secondary | ICD-10-CM | POA: Insufficient documentation

## 2023-02-24 DIAGNOSIS — M6281 Muscle weakness (generalized): Secondary | ICD-10-CM | POA: Insufficient documentation

## 2023-02-24 DIAGNOSIS — M25511 Pain in right shoulder: Secondary | ICD-10-CM | POA: Insufficient documentation

## 2023-02-24 DIAGNOSIS — M542 Cervicalgia: Secondary | ICD-10-CM | POA: Insufficient documentation

## 2023-02-24 NOTE — Telephone Encounter (Signed)
Called patient regarding her first no show this morning and left voicemail.  Asked to call back to reschedule if needed to meet frequency.  I reminded her of her next appt. 02/26/23 at 9:30 am  Karie Mainland, PT 02/24/23 2:33 PM Phone: [phone] Fax: [phone]

## 2023-02-26 ENCOUNTER — Encounter: Payer: Self-pay | Admitting: Physical Therapy

## 2023-02-26 ENCOUNTER — Ambulatory Visit: Payer: 59 | Admitting: Physical Therapy

## 2023-02-26 DIAGNOSIS — M6281 Muscle weakness (generalized): Secondary | ICD-10-CM

## 2023-02-26 DIAGNOSIS — M25511 Pain in right shoulder: Secondary | ICD-10-CM | POA: Diagnosis present

## 2023-02-26 DIAGNOSIS — M542 Cervicalgia: Secondary | ICD-10-CM

## 2023-02-26 DIAGNOSIS — M25512 Pain in left shoulder: Secondary | ICD-10-CM | POA: Diagnosis present

## 2023-02-26 NOTE — Therapy (Signed)
OUTPATIENT PHYSICAL THERAPY TREATMENT   Patient Name: Lauren Beck MRN: [id number] DOB:1960-08-13, 62 y.o., female Today's Date: 02/26/2023  END OF SESSION:  PT End of Session - 02/26/23 0937     Visit Number 5    Number of Visits 12    Date for PT Re-Evaluation 03/09/23    Authorization Type [employer]    PT Start Time 0932    PT Stop Time 1015    PT Time Calculation (min) 43 min    Activity Tolerance Patient tolerated treatment well;Patient limited by pain    Behavior During Therapy WFL for tasks assessed/performed                Past Medical History:  Diagnosis Date   Allergic rhinitis    Arthritis    Bronchitis    COPD (chronic obstructive pulmonary disease) (HCC)    Excessive or frequent menstruation    GERD (gastroesophageal reflux disease)    History of migraines    Hypothyroidism    Nonspecific abnormal electrocardiogram (ECG) (EKG)    Pure hypercholesterolemia    Tobacco use disorder    Unspecified adjustment reaction    Past Surgical History:  Procedure Laterality Date   HAMMER TOE SURGERY Bilateral    PELVIC LAPAROSCOPY     exploratory, thought might have a cyst, not seen   TUBAL LIGATION     Patient Active Problem List   Diagnosis Date Noted   Shortened PR interval 11/28/2022   Non-seasonal allergic rhinitis 07/24/2022   COPD (chronic obstructive pulmonary disease) with chronic bronchitis 05/12/2022   Prediabetes 05/12/2022   Intermittent claudication (HCC) 05/12/2022   DOE (dyspnea on exertion) 02/04/2022   Cervical spondylitis with radiculitis (HCC) 02/04/2022   Neuralgia 02/04/2022   Postmenopausal 11/14/2021   Mixed hyperlipidemia 11/14/2021   Current smoker 11/14/2021   Acquired hypothyroidism 11/14/2021   Gastroesophageal reflux disease 11/14/2021   TOBACCO ABUSE 03/16/2007    PCP: [name] FNP  REFERRING PROVIDER: Dawley, Alan Mulder, DO  REFERRING DIAG:   THERAPY DIAG:  Cervicalgia  Muscle weakness (generalized)  Bilateral  shoulder pain, unspecified chronicity  Rationale for Evaluation and Treatment: Rehabilitation  ONSET DATE: chronic   SUBJECTIVE:                                                                                                                                                                                                         SUBJECTIVE STATEMENT:  Patient overslept last visit.  Reiterated need to call if possible, avoid no shows. She has pain in her L shoulder and  nec, especially when reaching up.   Pt reports depression and frustration with her pain and inability to care for her home and self.   Hand dominance: Right  PERTINENT HISTORY:   Chronic pain  COPD, smoker  PAIN:  Are you having pain? Yes: NPRS scale: 8/10 Pain location: post neck, L arm Pain description: tight, heavy  Aggravating factors: activity  Relieving factors: nothing really  PRECAUTIONS: None  RED FLAGS: None     WEIGHT BEARING RESTRICTIONS: No  FALLS:  Has patient fallen in last 6 months? No She says she occasionally feels off balance if she gets up quick and steadies her self.  LIVING ENVIRONMENT: Lives with: lives with their spouse Lives in: Mobile home Stairs:  no issues Has following equipment at home: None  OCCUPATION: not working  PLOF: Independent with basic ADLs and Independent with community mobility without device  PATIENT GOALS: I want to get surgery  NEXT MD VISIT: post PT?   OBJECTIVE:   DIAGNOSTIC FINDINGS:  10/2022: Mild progression since 2022 of chronic disc and endplate degeneration at C5-C6 and C6-C7. Bulky rightward disc osteophyte complex at C5-C6. Mild spinal stenosis with up to mild cord mass effect at both levels. No cord signal abnormality. Moderate to severe bilateral C6 and moderate left C7 neural foraminal stenosis. Query Right C6 radiculitis. 2. Stable similar chronic disc and endplate degeneration at C4-C5 with similar mild spinal stenosis, moderate to  severe left greater than right C5 neural foraminal stenosis.  PATIENT SURVEYS:  FOTO 32%  COGNITION: Overall cognitive status: Within functional limits for tasks assessed  SENSATION: Within functional limits tested today.  Patient does report numbness and tingling in hands at times  POSTURE: rounded shoulders, forward head, and guarded  PALPATION: Unable to tolerate even light touch to bilateral upper arms scapular region upper traps posterior cervicals   CERVICAL ROM:   Active ROM A/PROM (deg) eval AROM  02/26/23  Flexion 20   Extension 20   Right lateral flexion 22   Left lateral flexion 18   Right rotation 30   Left rotation 25    (Blank rows = not tested)  UPPER EXTREMITY ROM:  Active ROM Right eval Left eval  Shoulder flexion 50 deg 50 deg  Shoulder extension 20 deg 10 deg  Shoulder abduction 45 45  Shoulder adduction    Shoulder internal rotation Pain with PROM, 20-30 deg Pain PROM, 20-30  Shoulder external rotation Pain with PROM 20-30 Pain with PROM  20-30  Elbow flexion    Elbow extension Pain  Pain   Wrist flexion    Wrist extension    Wrist ulnar deviation    Wrist radial deviation    Wrist pronation    Wrist supination Pain  Pain    (Blank rows = not tested)  UPPER EXTREMITY MMT:  MMT Right eval Left eval Rt./Lt.  02/18/23  Shoulder flexion 3- 3-   Shoulder extension     Shoulder abduction 2+ 2+   Shoulder adduction     Shoulder extension     Shoulder internal rotation   Rt 4/5  Shoulder external rotation   Rt 3+/5  Middle trapezius     Lower trapezius     Elbow flexion     Elbow extension     Wrist flexion     Wrist extension     Wrist ulnar deviation     Wrist radial deviation     Wrist pronation     Wrist supination  Grip strength 5 4    (Blank rows = not tested)  CERVICAL SPECIAL TESTS:  NT due to pain   FUNCTIONAL TESTS:  5 times sit to stand: 40 sec   TODAY'S TREATMENT:         [facility] Adult PT Treatment:                                                 DATE: 02/26/23 Therapeutic Exercise: NuStep L 3 for 6 min UE and LE  Supine chest press  x 10 dowel  Supine flexion dowel x 10 AAROM ER AROM no resistance Scapular retraction x 10  Lower trunk rotation x 10 with head turns External rotation yellow with chin tuck x 10  Looped band exercises for posture: chest press and overhead lift x 10   [facility] Adult PT Treatment:                                                DATE: 02/18/23 Therapeutic Exercise: Pulleys flexion and scaption 2 min each  UBE 4 min L1 , 2 min each direction Supine LTR with head turns Cervical flexion/extension, rotation with towel roll x 10  Scapular protract/retraction arms up then resting on table Arm arcs with cervical retraction x 10 , mod for Rt shoulder pain  ER unattached red band x 15  ER with arms supported on table (lower trap) Wall slides for shoulder flexion cues for neck  Wall with ball behind mid back for scapular retraction  Arm Arcs  with ball x 10  Goal post x 10  High row green band x 15  Row and extension x 15 green Shoulder flexion 2 x 15 red band    [facility] Adult PT Treatment:                                                DATE: 02/16/23 Therapeutic Exercise: Supine light cervical ROM: rotation and chin tuck Shoulder flexion with dowel AAROM  x 10 x 2 , chest press  Shoulder ER/IR with dowel AROM x 10 x 2  LTR with opp head turn x 10  Seated scap retraction  Seated row red band x 15  Seated cervical Manual Therapy: Passive ROM  Light soft tissue work to bilateral post cervicals, upper trap  Gentle sub-occ release                                                                                                                           [facility] Adult PT Treatment:  DATE: 01/28/23 Therapeutic Exercise: Seated scap retract L stretch at counter Supine chin tuck gentle  Supine cervical AROM rotation  Supine dowel  pressup to pullover  Supine shoulder ER AAROM LTR with opp head turn   Modalities: HMP to cervical x 10 minutes   [facility] Adult PT Treatment:                                                DATE: 01/26/23 Therapeutic Exercise: Supine LR with head turns Chin tuck Scapular squeeze AROM bilateral UE flexion with dowel L stretch standing  Self Care: POC, heat vs ice, shoulder pain and result of immobility, chronic pain     PATIENT EDUCATION:  Education details: POC, HEP , eval findings  Person educated: Patient Education method: Explanation, Demonstration, Tactile cues, and Verbal cues Education comprehension: verbalized understanding and needs further education  HOME EXERCISE PROGRAM: Access Code: [email or web address] URL: [email or web address] Date: 02/18/2023 Prepared by: Karie Mainland  Exercises - Supine Lower Trunk Rotation  - 1-2 x daily - 7 x weekly - 2 sets - 10 reps - 10 hold - Supine Cervical Retraction with Towel  - 1-2 x daily - 7 x weekly - 2 sets - 10 reps - 5 hold - Seated Scapular Retraction  - 1-2 x daily - 7 x weekly - 2 sets - 10 reps - 10 hold - Standing 'L' Stretch at Counter  - 1-2 x daily - 7 x weekly - 1 sets - 5 reps - 30 hold - Shoulder External Rotation and Scapular Retraction with Resistance  - 1 x daily - 7 x weekly - 2 sets - 10 reps - 5 hold - Standing Shoulder Row with Anchored Resistance  - 1 x daily - 7 x weekly - 2 sets - 10 reps - 5 hold - Shoulder extension with resistance - Neutral  - 1 x daily - 7 x weekly - 2 sets - 10 reps - 5 hold  ASSESSMENT:  CLINICAL IMPRESSION: Patient cont to have severe pain in neck and shoulder.  Modified exercises today to reduce tension with UE functional use.  She will have another 1-2 visits but overall progress is min and temporary.     EVAL: Patient is a 62y.o. female who was seen today for physical therapy evaluation and treatment for cervical spondylitis. She presents with significant pain and weakness.   Pain with Passive shoulder ROM including external rotation, indicating possible shoulder issue vs secondary pain from prolonged immobility. If she is no better in 6 visits, will refer back to Neurosurgery but encouraged PT and reinforced the need to move even if she has surgery.   OBJECTIVE IMPAIRMENTS: Abnormal gait, decreased activity tolerance, decreased mobility, difficulty walking, decreased ROM, decreased strength, increased fascial restrictions, increased muscle spasms, impaired flexibility, impaired UE functional use, improper body mechanics, postural dysfunction, and pain.   ACTIVITY LIMITATIONS: carrying, lifting, bending, sleeping, stairs, bed mobility, dressing, reach over head, hygiene/grooming, and locomotion level  PARTICIPATION LIMITATIONS: meal prep, cleaning, laundry, driving, shopping, community activity, and yard work  PERSONAL FACTORS: Behavior pattern, Past/current experiences, Social background, Time since onset of injury/illness/exacerbation, and 1-2 comorbidities: COPD, chronic pain  are also affecting patient's functional outcome.   REHAB POTENTIAL: Good  CLINICAL DECISION MAKING: Evolving/moderate complexity  EVALUATION COMPLEXITY: Moderate   GOALS: Goals reviewed with patient? Yes   LONG TERM GOALS: Target  date: 03/09/2023    Patient will be independent with home exercise program for cervical spine and bilateral shoulders Baseline: Unknown Goal status: ongoing   2.  Patient will be able to raise arms overhead with min increase in pain for more functional lifting at home Baseline: Pain even below shoulder height Goal status: ongoing   3.  Patient will be able to sit to stand x 5 and 20 seconds or less Baseline: 40 seconds Goal status:ongoing   4.  Patient will identify 3 self-care strategies for reducing pain at home Baseline: unknown Goal status: ongoing   5.  Foto score will improve to 49% better Baseline: 32% Goal status: ongoing  PLAN:  PT  FREQUENCY: 2x/week   PT DURATION: 6 weeks patient will attend 6-8 visits and if no better will discharge  PLANNED INTERVENTIONS: Therapeutic exercises, Therapeutic activity, Neuromuscular re-education, Balance training, Gait training, Patient/Family education, Self Care, Joint mobilization, Electrical stimulation, Spinal mobilization, Cryotherapy, Moist heat, Manual therapy, and Re-evaluation  PLAN FOR NEXT SESSION: Check HEP, progress bilateral UE AROM , manual as tolerated   Karie Mainland, PT 02/26/23 11:03 AM Phone: [phone] Fax: [phone]

## 2023-03-03 ENCOUNTER — Telehealth: Payer: Self-pay | Admitting: Gastroenterology

## 2023-03-03 ENCOUNTER — Other Ambulatory Visit: Payer: Self-pay

## 2023-03-03 MED ORDER — PANTOPRAZOLE SODIUM 40 MG PO TBEC: 40.0000 mg | DELAYED_RELEASE_TABLET | Freq: Two times a day (BID) | ORAL | 3 refills | Status: DC

## 2023-03-03 NOTE — Telephone Encounter (Addendum)
Protonix has been refilled as requested.

## 2023-03-03 NOTE — Telephone Encounter (Signed)
Inbound call from patient requesting medication refill for her acid reflux.   Please advise.

## 2023-03-04 NOTE — Therapy (Unsigned)
OUTPATIENT PHYSICAL THERAPY TREATMENT DISCHARGE   Patient Name: Lauren Beck MRN: [id number] DOB:1961-02-17, 62 y.o., female Today's Date: 03/05/2023  END OF SESSION:  PT End of Session - 03/05/23 0926     Visit Number 6    Number of Visits 12    Date for PT Re-Evaluation 03/09/23    Authorization Type [employer]    PT Start Time [phone]    PT Stop Time 1015    PT Time Calculation (min) 50 min    Activity Tolerance Patient tolerated treatment well;Patient limited by pain    Behavior During Therapy WFL for tasks assessed/performed                 Past Medical History:  Diagnosis Date   Allergic rhinitis    Arthritis    Bronchitis    COPD (chronic obstructive pulmonary disease) (HCC)    Excessive or frequent menstruation    GERD (gastroesophageal reflux disease)    History of migraines    Hypothyroidism    Nonspecific abnormal electrocardiogram (ECG) (EKG)    Pure hypercholesterolemia    Tobacco use disorder    Unspecified adjustment reaction    Past Surgical History:  Procedure Laterality Date   HAMMER TOE SURGERY Bilateral    PELVIC LAPAROSCOPY     exploratory, thought might have a cyst, not seen   TUBAL LIGATION     Patient Active Problem List   Diagnosis Date Noted   Shortened PR interval 11/28/2022   Non-seasonal allergic rhinitis 07/24/2022   COPD (chronic obstructive pulmonary disease) with chronic bronchitis 05/12/2022   Prediabetes 05/12/2022   Intermittent claudication (HCC) 05/12/2022   DOE (dyspnea on exertion) 02/04/2022   Cervical spondylitis with radiculitis (HCC) 02/04/2022   Neuralgia 02/04/2022   Postmenopausal 11/14/2021   Mixed hyperlipidemia 11/14/2021   Current smoker 11/14/2021   Acquired hypothyroidism 11/14/2021   Gastroesophageal reflux disease 11/14/2021   TOBACCO ABUSE 03/16/2007    PCP: [name] FNP  REFERRING PROVIDER: Dawley, Alan Mulder, DO  REFERRING DIAG:   THERAPY DIAG:  Cervicalgia  Muscle weakness  (generalized)  Bilateral shoulder pain, unspecified chronicity  Rationale for Evaluation and Treatment: Rehabilitation  ONSET DATE: chronic   SUBJECTIVE:                                                                                                                                                                                                         SUBJECTIVE STATEMENT:   The medication has been giving me a headache.  She does not have a surgical appt. When I bent  down, forward it feel like the bone is sliding out and I get dizzy.  L shoulder pops out and its hurting.     Hand dominance: Right  PERTINENT HISTORY:   Chronic pain, COPD, smoker  PAIN:  Are you having pain? Yes: NPRS scale: 8/10 Pain location: post neck, L arm Pain description: tight, heavy  Aggravating factors: activity  Relieving factors: nothing really  PRECAUTIONS: None  RED FLAGS: None     WEIGHT BEARING RESTRICTIONS: No  FALLS:  Has patient fallen in last 6 months? No She says she occasionally feels off balance if she gets up quick and steadies her self.  LIVING ENVIRONMENT: Lives with: lives with their spouse Lives in: Mobile home Stairs:  no issues Has following equipment at home: None  OCCUPATION: not working  PLOF: Independent with basic ADLs and Independent with community mobility without device  PATIENT GOALS: I want to get surgery  NEXT MD VISIT: post PT?   OBJECTIVE:   DIAGNOSTIC FINDINGS:  10/2022: Mild progression since 2022 of chronic disc and endplate degeneration at C5-C6 and C6-C7. Bulky rightward disc osteophyte complex at C5-C6. Mild spinal stenosis with up to mild cord mass effect at both levels. No cord signal abnormality. Moderate to severe bilateral C6 and moderate left C7 neural foraminal stenosis. Query Right C6 radiculitis. 2. Stable similar chronic disc and endplate degeneration at C4-C5 with similar mild spinal stenosis, moderate to severe left greater than  right C5 neural foraminal stenosis.  PATIENT SURVEYS:  FOTO 32%  COGNITION: Overall cognitive status: Within functional limits for tasks assessed  SENSATION: Within functional limits tested today.  Patient does report numbness and tingling in hands at times  POSTURE: rounded shoulders, forward head, and guarded  PALPATION: Unable to tolerate even light touch to bilateral upper arms scapular region upper traps posterior cervicals   CERVICAL ROM:   Active ROM A/PROM (deg) eval AROM  03/05/23  Flexion 20 17 P  Extension 20 28  Right lateral flexion 22 26  Left lateral flexion 18 25  Right rotation 30 40  Left rotation 25 20   (Blank rows = not tested)  UPPER EXTREMITY ROM:  Active ROM Right eval Left eval Rt 03/05/23 Lt  03/05/23  Shoulder flexion 50 deg 50 deg 110 90  Shoulder extension 20 deg 10 deg    Shoulder abduction 45 45 60 60  Shoulder adduction      Shoulder internal rotation Pain with PROM, 20-30 deg Pain PROM, 20-30 No change  No change   Shoulder external rotation Pain with PROM 20-30 Pain with PROM  20-30 No change  No change  Elbow flexion      Elbow extension Pain  Pain     Wrist flexion      Wrist extension      Wrist ulnar deviation      Wrist radial deviation      Wrist pronation      Wrist supination Pain  Pain      (Blank rows = not tested)  UPPER EXTREMITY MMT:  MMT Right eval Left eval Rt./Lt.  02/18/23 Rt/Lt.  03/05/23  Shoulder flexion 3- 3-    Shoulder extension      Shoulder abduction 2+ 2+    Shoulder adduction      Shoulder extension      Shoulder internal rotation   Rt 4/5 4/5, 4/5  Shoulder external rotation   Rt 3+/5 3+/5, 3+/5  Middle trapezius      Lower  trapezius      Elbow flexion      Elbow extension      Wrist flexion      Wrist extension      Wrist ulnar deviation      Wrist radial deviation      Wrist pronation      Wrist supination      Grip strength 5 4     (Blank rows = not tested)  CERVICAL SPECIAL  TESTS:  NT due to pain   FUNCTIONAL TESTS:  5 times sit to stand: 40 sec   TODAY'S TREATMENT:          [facility] Adult PT Treatment:                                                DATE: 03/05/23 Therapeutic Exercise: Supine LTR Chin tuck  Cervical rotation  ROM neck and shoulders  MMT  Seated table slides flexion  Red band extension and row x 20   Self Care: Plan of care, FOTO score, anatomy (disc/spondylosis, progression)   [facility] Adult PT Treatment:                                                DATE: 02/26/23 Therapeutic Exercise: NuStep L 3 for 6 min UE and LE  Supine chest press  x 10 dowel  Supine flexion dowel x 10 AAROM ER AROM no resistance Scapular retraction x 10  Lower trunk rotation x 10 with head turns External rotation yellow with chin tuck x 10  Looped band exercises for posture: chest press and overhead lift x 10   [facility] Adult PT Treatment:                                                DATE: 02/18/23 Therapeutic Exercise: Pulleys flexion and scaption 2 min each  UBE 4 min L1 , 2 min each direction Supine LTR with head turns Cervical flexion/extension, rotation with towel roll x 10  Scapular protract/retraction arms up then resting on table Arm arcs with cervical retraction x 10 , mod for Rt shoulder pain  ER unattached red band x 15  ER with arms supported on table (lower trap) Wall slides for shoulder flexion cues for neck  Wall with ball behind mid back for scapular retraction  Arm Arcs  with ball x 10  Goal post x 10  High row green band x 15  Row and extension x 15 green Shoulder flexion 2 x 15 red band    [facility] Adult PT Treatment:                                                DATE: 02/16/23 Therapeutic Exercise: Supine light cervical ROM: rotation and chin tuck Shoulder flexion with dowel AAROM  x 10 x 2 , chest press  Shoulder ER/IR with dowel AROM x 10 x 2  LTR with opp head turn x 10  Seated scap retraction  Seated row red band x 15  Seated  cervical Manual Therapy: Passive ROM  Light soft tissue work to bilateral post cervicals, upper trap  Gentle sub-occ release                                                                                                                           [facility] Adult PT Treatment:                                                DATE: 01/28/23 Therapeutic Exercise: Seated scap retract L stretch at counter Supine chin tuck gentle  Supine cervical AROM rotation  Supine dowel pressup to pullover  Supine shoulder ER AAROM LTR with opp head turn   Modalities: HMP to cervical x 10 minutes   [facility] Adult PT Treatment:                                                DATE: 01/26/23 Therapeutic Exercise: Supine LR with head turns Chin tuck Scapular squeeze AROM bilateral UE flexion with dowel L stretch standing  Self Care: POC, heat vs ice, shoulder pain and result of immobility, chronic pain     PATIENT EDUCATION:  Education details: POC, HEP , eval findings  Person educated: Patient Education method: Explanation, Demonstration, Tactile cues, and Verbal cues Education comprehension: verbalized understanding and needs further education  HOME EXERCISE PROGRAM: Access Code: [email or web address] URL: [email or web address] Date: 02/18/2023 Prepared by: Karie Mainland  Exercises - Supine Lower Trunk Rotation  - 1-2 x daily - 7 x weekly - 2 sets - 10 reps - 10 hold - Supine Cervical Retraction with Towel  - 1-2 x daily - 7 x weekly - 2 sets - 10 reps - 5 hold - Seated Scapular Retraction  - 1-2 x daily - 7 x weekly - 2 sets - 10 reps - 10 hold - Standing 'L' Stretch at Counter  - 1-2 x daily - 7 x weekly - 1 sets - 5 reps - 30 hold, can be a seated table slide - Shoulder External Rotation and Scapular Retraction with Resistance  - 1 x daily - 7 x weekly - 2 sets - 10 reps - 5 hold - Standing Shoulder Row with Anchored Resistance  - 1 x daily - 7 x weekly - 2 sets - 10 reps - 5 hold - Shoulder  extension with resistance - Neutral  - 1 x daily - 7 x weekly - 2 sets - 10 reps - 5 hold  ASSESSMENT:  CLINICAL IMPRESSION: Patient has made no progress with respect to pain and function. She does demonstrate increased ROM in her shoulders but with severe pain. She has  been limited in her HEP due technique and pain.  She would like to return to her MD to see if surgery is still an option. She gets min relief from pain medication and side effects interfering with functional mobility.  FOTO score is unchanged form initial intake.     Discharge from PT at this time, may return after surgery if recommended.    EVAL: Patient is a 62y.o. female who was seen today for physical therapy evaluation and treatment for cervical spondylitis. She presents with significant pain and weakness.  Pain with Passive shoulder ROM including external rotation, indicating possible shoulder issue vs secondary pain from prolonged immobility. If she is no better in 6 visits, will refer back to Neurosurgery but encouraged PT and reinforced the need to move even if she has surgery.   OBJECTIVE IMPAIRMENTS: Abnormal gait, decreased activity tolerance, decreased mobility, difficulty walking, decreased ROM, decreased strength, increased fascial restrictions, increased muscle spasms, impaired flexibility, impaired UE functional use, improper body mechanics, postural dysfunction, and pain.   ACTIVITY LIMITATIONS: carrying, lifting, bending, sleeping, stairs, bed mobility, dressing, reach over head, hygiene/grooming, and locomotion level  PARTICIPATION LIMITATIONS: meal prep, cleaning, laundry, driving, shopping, community activity, and yard work  PERSONAL FACTORS: Behavior pattern, Past/current experiences, Social background, Time since onset of injury/illness/exacerbation, and 1-2 comorbidities: COPD, chronic pain  are also affecting patient's functional outcome.   REHAB POTENTIAL: Good  CLINICAL DECISION MAKING:  Evolving/moderate complexity  EVALUATION COMPLEXITY: Moderate   GOALS: Goals reviewed with patient? Yes   LONG TERM GOALS: Target date: 03/09/2023    Patient will be independent with home exercise program for cervical spine and bilateral shoulders Baseline: Unknown Goal status: partially met , needed cues   2.  Patient will be able to raise arms overhead with min increase in pain for more functional lifting at home Baseline: Pain even below shoulder height Goal status: partially met   3.  Patient will be able to sit to stand x 5 and 20 seconds or less Baseline: 40 seconds Progress: 12.5 sec  Goal status:MET   4.  Patient will identify 3 self-care strategies for reducing pain at home Baseline: unknown Goal status: MET (ibuprofen, work thru pain and repositioning, heating pad)   5.  FOTO score will improve to 49% better Baseline: 32% Progress: 32% Goal status: MET PLAN:  PT FREQUENCY: 2x/week   PT DURATION: 6 weeks patient will attend 6-8 visits and if no better will discharge  PLANNED INTERVENTIONS: Therapeutic exercises, Therapeutic activity, Neuromuscular re-education, Balance training, Gait training, Patient/Family education, Self Care, Joint mobilization, Electrical stimulation, Spinal mobilization, Cryotherapy, Moist heat, Manual therapy, and Re-evaluation  PLAN FOR NEXT SESSION: DC from PT   Karie Mainland, PT 03/05/23 10:17 AM Phone: [phone] Fax: [phone]     PHYSICAL THERAPY DISCHARGE SUMMARY  Visits from Start of Care: 6  Current functional level related to goals / functional outcomes: See above    Remaining deficits: Pain , ROM, strength   Education / Equipment: Extensive on posture, exercise, anatomy and pain/sx mgmt, pillow   Patient agrees to discharge. Patient goals were partially met. Patient is being discharged due to lack of progress.   Karie Mainland, PT 03/05/23 10:17 AM Phone: [phone] Fax: [phone]

## 2023-03-05 ENCOUNTER — Encounter: Payer: Self-pay | Admitting: Physical Therapy

## 2023-03-05 ENCOUNTER — Ambulatory Visit: Payer: 59 | Admitting: Physical Therapy

## 2023-03-05 DIAGNOSIS — M542 Cervicalgia: Secondary | ICD-10-CM

## 2023-03-05 DIAGNOSIS — M6281 Muscle weakness (generalized): Secondary | ICD-10-CM

## 2023-03-05 DIAGNOSIS — M25512 Pain in left shoulder: Secondary | ICD-10-CM

## 2023-03-09 ENCOUNTER — Ambulatory Visit: Payer: 59 | Admitting: Physical Therapy

## 2023-03-10 ENCOUNTER — Ambulatory Visit: Payer: 59 | Admitting: Gastroenterology

## 2023-03-16 ENCOUNTER — Other Ambulatory Visit: Payer: 59

## 2023-03-18 ENCOUNTER — Other Ambulatory Visit (INDEPENDENT_AMBULATORY_CARE_PROVIDER_SITE_OTHER): Payer: 59

## 2023-03-18 DIAGNOSIS — Z79899 Other long term (current) drug therapy: Secondary | ICD-10-CM | POA: Diagnosis not present

## 2023-03-18 DIAGNOSIS — E782 Mixed hyperlipidemia: Secondary | ICD-10-CM

## 2023-03-19 ENCOUNTER — Other Ambulatory Visit: Payer: Self-pay | Admitting: Family

## 2023-03-19 DIAGNOSIS — E039 Hypothyroidism, unspecified: Secondary | ICD-10-CM

## 2023-03-19 LAB — HEPATIC FUNCTION PANEL
ALT: 24 U/L (ref 0–35)
AST: 20 U/L (ref 0–37)
Albumin: 4.1 g/dL (ref 3.5–5.2)
Alkaline Phosphatase: 124 U/L — ABNORMAL HIGH (ref 39–117)
Bilirubin, Direct: 0 mg/dL (ref 0.0–0.3)
Total Bilirubin: 0.3 mg/dL (ref 0.2–1.2)
Total Protein: 7.1 g/dL (ref 6.0–8.3)

## 2023-03-19 LAB — LIPID PANEL
Cholesterol: 177 mg/dL (ref 0–200)
HDL: 56.8 mg/dL (ref >39.00)
LDL Cholesterol: 82 mg/dL (ref 0–99)
NonHDL: 120.46
Total CHOL/HDL Ratio: 3
Triglycerides: 191 mg/dL — ABNORMAL HIGH (ref 0.0–149.0)
VLDL: 38.2 mg/dL (ref 0.0–40.0)

## 2023-03-19 NOTE — Progress Notes (Signed)
Labs are stable.

## 2023-03-20 ENCOUNTER — Other Ambulatory Visit: Payer: Self-pay | Admitting: Family

## 2023-03-20 ENCOUNTER — Other Ambulatory Visit (INDEPENDENT_AMBULATORY_CARE_PROVIDER_SITE_OTHER): Payer: 59

## 2023-03-20 DIAGNOSIS — E782 Mixed hyperlipidemia: Secondary | ICD-10-CM

## 2023-03-20 DIAGNOSIS — E039 Hypothyroidism, unspecified: Secondary | ICD-10-CM

## 2023-03-20 LAB — TSH: TSH: 3.79 u[IU]/mL (ref 0.35–5.50)

## 2023-03-20 NOTE — Telephone Encounter (Signed)
Prescription pended for signature.

## 2023-03-20 NOTE — Telephone Encounter (Signed)
Prescription Request  03/20/2023  LOV: 11/28/2022  What is the name of the medication or equipment? atorvastatin (LIPITOR) 10 MG tablet  Have you contacted your pharmacy to request a refill? No   Which pharmacy would you like this sent to?  [id number] - [place], [place] - [address] AT [facility] [address] [place] [address] Phone: [phone] Fax: [phone]    Patient notified that their request is being sent to the clinical staff for review and that they should receive a response within 2 business days.   Please advise at [facility] [phone];

## 2023-03-23 ENCOUNTER — Telehealth: Payer: Self-pay | Admitting: Family

## 2023-03-23 MED ORDER — ATORVASTATIN CALCIUM 10 MG PO TABS
10.0000 mg | ORAL_TABLET | Freq: Every day | ORAL | 3 refills | Status: DC
Start: 2023-03-23 — End: 2023-10-15

## 2023-03-23 NOTE — Telephone Encounter (Signed)
Noted  

## 2023-03-23 NOTE — Progress Notes (Signed)
Continue current dose of thyroid medication

## 2023-03-23 NOTE — Telephone Encounter (Signed)
Patient called in returning call she received. Relayed message regarding thyroid.

## 2023-04-24 ENCOUNTER — Telehealth: Payer: Self-pay | Admitting: Family

## 2023-04-24 NOTE — Telephone Encounter (Signed)
[name], FNP  P Dugal Pool Can we look into this pt, she has an appt Monday at 1220 in regards to urine for surgery? Can we make sure this is not a pre op visit, bc if it is she needs to have the pre op form for clearance from her surgeon prior to being seen for pre op visit. If she does not have this and is for a pre op she will have to make sure we have this and reschedule once received. ------------------------------------------------------- LM for pt to return call.

## 2023-04-24 NOTE — Telephone Encounter (Signed)
Pt returned call. This is not a pre op appointment. Insurance is requiring a test and she was told to come to her PCP to get a urine test

## 2023-04-27 ENCOUNTER — Ambulatory Visit (INDEPENDENT_AMBULATORY_CARE_PROVIDER_SITE_OTHER): Payer: 59 | Admitting: Family

## 2023-04-27 VITALS — BP 132/74 | HR 72 | Temp 97.7°F | Wt 140.0 lb

## 2023-04-27 DIAGNOSIS — Z87891 Personal history of nicotine dependence: Secondary | ICD-10-CM | POA: Diagnosis not present

## 2023-04-27 DIAGNOSIS — Z01818 Encounter for other preprocedural examination: Secondary | ICD-10-CM

## 2023-04-27 DIAGNOSIS — R7989 Other specified abnormal findings of blood chemistry: Secondary | ICD-10-CM | POA: Diagnosis not present

## 2023-04-27 DIAGNOSIS — R635 Abnormal weight gain: Secondary | ICD-10-CM

## 2023-04-27 DIAGNOSIS — R822 Biliuria: Secondary | ICD-10-CM | POA: Diagnosis not present

## 2023-04-27 DIAGNOSIS — E039 Hypothyroidism, unspecified: Secondary | ICD-10-CM

## 2023-04-27 LAB — POCT URINE DIPSTICK
Bilirubin, UA: NEGATIVE
Blood, UA: NEGATIVE
Glucose, UA: NEGATIVE mg/dL
Ketones, POC UA: NEGATIVE mg/dL
Leukocytes, UA: NEGATIVE
Nitrite, UA: NEGATIVE
POC PROTEIN,UA: NEGATIVE
Spec Grav, UA: 1.01 (ref 1.010–1.025)
Urobilinogen, UA: 2 U/dL
pH, UA: 6 (ref 5.0–8.0)

## 2023-04-27 NOTE — Assessment & Plan Note (Signed)
Smoking cessation instruction/counseling given:  commended patient for quitting and reviewed strategies for preventing relapses. Nicotine urine screen today pending results, for surgeon.

## 2023-04-27 NOTE — Assessment & Plan Note (Signed)
Incidental finding, confirmed with urinalysis. Pt with elevated alk phos, will repeat. Consider u/s abdomen if necessary.

## 2023-04-27 NOTE — Progress Notes (Signed)
Established Patient Office Visit  Subjective:      CC:  Chief Complaint  Patient presents with   Medical Management of Chronic Issues    Pt is having shoulder surgery to get rid of "bone spurs." Was told by the surgeons office that she needed a "pee test." She states that the surgeons office wouldn't do this test and told her she needed to come here.    HPI: Lauren Beck is a 62 y.o. female presenting on 04/27/2023 for Medical Management of Chronic Issues (Pt is having shoulder surgery to get rid of "bone spurs." Was told by the surgeons office that she needed a "pee test." She states that the surgeons office wouldn't do this test and told her she needed to come here.) . Smoking: has been successful in quitting smoking, stopped nicotine patches about three months ago, now using sensa vape, which box states zero nicotine. In small print does say may have trace nicotine. She has been seeing surgeon Dr. Jake Samples for   [facility] Readings from Last 3 Encounters:  04/27/23 140 lb (63.5 kg)  12/17/22 137 lb (62.1 kg)  11/28/22 138 lb 3.2 oz (62.7 kg)   Is pending surgery for cervical spondylosis with radiculopathy, she needs to have a negative nicotine test prior to considering surgery.    Social history:  Relevant past medical, surgical, family and social history reviewed and updated as indicated. Interim medical history since our last visit reviewed.  Allergies and medications reviewed and updated.  DATA REVIEWED: CHART IN EPIC     ROS: Negative unless specifically indicated above in HPI.    Current Outpatient Medications:    acetaminophen (TYLENOL) 500 MG tablet, Take 500 mg by mouth every 6 (six) hours as needed., Disp: , Rfl:    atorvastatin (LIPITOR) 10 MG tablet, Take 1 tablet (10 mg total) by mouth daily., Disp: 90 tablet, Rfl: 3   bismuth subsalicylate (PEPTO BISMOL) 262 MG/15ML suspension, Take 15 mLs by mouth every 6 (six) hours as needed for indigestion or diarrhea or loose  stools., Disp: , Rfl:    cetirizine (ZYRTEC) 10 MG tablet, Take 1 tablet (10 mg total) by mouth daily., Disp: 90 tablet, Rfl: 3   Fexofenadine HCl (ALLEGRA ALLERGY PO), Take by mouth., Disp: , Rfl:    fluticasone (FLONASE) 50 MCG/ACT nasal spray, Place 2 sprays into both nostrils daily., Disp: 16 g, Rfl: 6   Fluticasone-Umeclidin-Vilant (TRELEGY ELLIPTA) 100-62.5-25 MCG/ACT AEPB, Inhale 1 puff into the lungs daily., Disp: 14 each, Rfl: 0   Multiple Vitamin (MULTIVITAMIN WITH MINERALS) TABS tablet, Take 1 tablet by mouth daily., Disp: , Rfl:    Multiple Vitamins-Minerals (HAIR SKIN & NAILS PO), Take by mouth., Disp: , Rfl:    pantoprazole (PROTONIX) 40 MG tablet, Take 1 tablet (40 mg total) by mouth 2 (two) times daily., Disp: 60 tablet, Rfl: 3   SYNTHROID 150 MCG tablet, Take one po qd for six days, do not take on day 7, Disp: 90 tablet, Rfl: 3   vitamin B-12 (CYANOCOBALAMIN) 500 MCG tablet, Take 500 mcg by mouth daily., Disp: , Rfl:       Objective:    BP 132/74 (BP Location: Left Arm, Patient Position: Sitting, Cuff Size: Normal)   Pulse 72   Temp 97.7 F (36.5 C) (Temporal)   Wt 140 lb (63.5 kg)   LMP 04/06/2012   SpO2 96%   BMI 26.45 kg/m   Wt Readings from Last 3 Encounters:  04/27/23 140 lb (63.5 kg)  12/17/22 137 lb (62.1 kg)  11/28/22 138 lb 3.2 oz (62.7 kg)    Physical Exam Constitutional:      General: She is not in acute distress.    Appearance: Normal appearance. She is normal weight. She is not ill-appearing, toxic-appearing or diaphoretic.  HENT:     Head: Normocephalic.  Cardiovascular:     Rate and Rhythm: Normal rate.  Pulmonary:     Effort: Pulmonary effort is normal.  Musculoskeletal:        General: Normal range of motion.  Neurological:     General: No focal deficit present.     Mental Status: She is alert and oriented to person, place, and time. Mental status is at baseline.  Psychiatric:        Mood and Affect: Mood normal.        Behavior:  Behavior normal.        Thought Content: Thought content normal.        Judgment: Judgment normal.           Assessment & Plan:  Preop testing -     POCT URINE DIPSTICK  Bilirubin in urine Assessment & Plan: Incidental finding, confirmed with urinalysis. Pt with elevated alk phos, will repeat. Consider u/s abdomen if necessary.  Orders: -     Urinalysis w microscopic + reflex cultur; Future -     Hepatic function panel; Future  Quit smoking within past year Assessment & Plan: Smoking cessation instruction/counseling given:  commended patient for quitting and reviewed strategies for preventing relapses. Nicotine urine screen today pending results, for surgeon.   Orders: -     Nicotine screen, urine  History of tobacco use -     Nicotine screen, urine  High serum vitamin B12  Abnormal weight gain  Acquired hypothyroidism -     TSH; Future     Return in about 3 months (around 07/28/2023) for f/u cholesterol.  [name], MSN, APRN, FNP-C [facility]

## 2023-04-27 NOTE — Telephone Encounter (Signed)
FYI  We will move forward, this seems a bit odd for an insurance request but we will assess at her visit.

## 2023-04-28 LAB — URINALYSIS W MICROSCOPIC + REFLEX CULTURE
Bacteria, UA: NONE SEEN /[HPF]
Bilirubin Urine: NEGATIVE
Glucose, UA: NEGATIVE
Hgb urine dipstick: NEGATIVE
Hyaline Cast: NONE SEEN /[LPF]
Ketones, ur: NEGATIVE
Leukocyte Esterase: NEGATIVE
Nitrites, Initial: NEGATIVE
Protein, ur: NEGATIVE
RBC / HPF: NONE SEEN /[HPF] (ref 0–2)
Specific Gravity, Urine: 1.006 (ref 1.001–1.035)
WBC, UA: NONE SEEN /[HPF] (ref 0–5)
pH: 6.5 (ref 5.0–8.0)

## 2023-04-28 LAB — NO CULTURE INDICATED

## 2023-04-28 LAB — NICOTINE SCREEN, URINE: Cotinine Ql Scrn, Ur: POSITIVE ng/mL — AB

## 2023-04-28 NOTE — Progress Notes (Signed)
Ordered urine because pt was concerned over bilirubin on urine, but with urinalysis not there so it was a false positive with the strips.

## 2023-04-29 ENCOUNTER — Other Ambulatory Visit: Payer: Self-pay | Admitting: Family

## 2023-04-30 NOTE — Progress Notes (Signed)
Your test for nicotine did come back positive.  On the box of the vape that you have been using it did say that there are potentially traces of nicotine so I am curious if this might be what is contributing to the nicotine positive result

## 2023-05-01 ENCOUNTER — Telehealth: Payer: Self-pay | Admitting: Family

## 2023-05-01 ENCOUNTER — Other Ambulatory Visit (INDEPENDENT_AMBULATORY_CARE_PROVIDER_SITE_OTHER): Payer: 59

## 2023-05-01 DIAGNOSIS — R822 Biliuria: Secondary | ICD-10-CM | POA: Diagnosis not present

## 2023-05-01 DIAGNOSIS — E039 Hypothyroidism, unspecified: Secondary | ICD-10-CM

## 2023-05-01 LAB — HEPATIC FUNCTION PANEL
ALT: 15 U/L (ref 0–35)
AST: 15 U/L (ref 0–37)
Albumin: 4 g/dL (ref 3.5–5.2)
Alkaline Phosphatase: 125 U/L — ABNORMAL HIGH (ref 39–117)
Bilirubin, Direct: 0.1 mg/dL (ref 0.0–0.3)
Total Bilirubin: 0.4 mg/dL (ref 0.2–1.2)
Total Protein: 6.6 g/dL (ref 6.0–8.3)

## 2023-05-01 LAB — TSH: TSH: 6.03 u[IU]/mL — ABNORMAL HIGH (ref 0.35–5.50)

## 2023-05-01 NOTE — Telephone Encounter (Signed)
Pt called requesting a call back from lindsay. Pt didn't states what call was regarding. Call back  # [phone]

## 2023-05-01 NOTE — Telephone Encounter (Signed)
Spoke with pt and she states that she needs me to fax Dr. Mattie Marlin office a copy of her Nicotine result; this has been taken care [phone]). While speaking to her she asked if Wyatt Mage would be willing to refill her Hydrocodone 5-325. She is currently getting this from Dr. Mattie Marlin office but they make her come in for an OV every time she needs it refill and this is costing her $100 copay every time. Advised pt that it could be Monday before Tabitha responds. She verbalized understanding.  Tabitha - please advise. Thanks!

## 2023-05-04 ENCOUNTER — Other Ambulatory Visit: Payer: Self-pay | Admitting: Family

## 2023-05-04 DIAGNOSIS — E039 Hypothyroidism, unspecified: Secondary | ICD-10-CM

## 2023-05-04 NOTE — Telephone Encounter (Signed)
Unfortunately she would have to continue to get this from lab especially since it is a controlled substance and can not be obtained from another provider as she should have signed a drug contract.   I also do not treat for chronic pain management. Sorry.

## 2023-05-04 NOTE — Progress Notes (Signed)
Thyroid is creeping upwards again, verify pt is taking synthroid once daily for six days and off the 7th. The TSH is back and forth, I think we need a thyroid ultrasound.   I have ordered imaging for you at [facility] outpatient diagnostic center for a thyroid u/s. This order has been sent over for you electronically.  Please call [phone] to schedule this appointment.

## 2023-05-04 NOTE — Telephone Encounter (Signed)
Spoke with pt, relayed Dugal's response. Pt had no questions/concerns. Call back # [phone]

## 2023-05-04 NOTE — Telephone Encounter (Signed)
LM for pt to returncall

## 2023-05-06 NOTE — Telephone Encounter (Signed)
Pt called asking for a call back from Redan. Pt states she has some questions about 2 different meds to help with her smoking issues. Call back # [phone]

## 2023-05-07 ENCOUNTER — Encounter: Payer: Self-pay | Admitting: Family

## 2023-05-07 MED ORDER — BUPROPION HCL ER (SR) 150 MG PO TB12: ORAL_TABLET | ORAL | 1 refills | Status: DC

## 2023-05-07 NOTE — Telephone Encounter (Signed)
Spoke with pt and she has never had a seizure or an eating disorder. Rx has been sent in per Tabitha. Nothing further was needed.

## 2023-05-07 NOTE — Addendum Note (Signed)
Addended by: Jaynee Eagles C on: 05/07/2023 02:55 PM   Modules accepted: Orders

## 2023-05-07 NOTE — Telephone Encounter (Signed)
Spoke with pt and she is wanting to know if Tabitha would prescribe something to help her quit smoking. She spoke with her pharmacist and they recommended Chantix or Wellbutrin. Pt states that she will not be able to afford Chantix as it would cost her $185. She would like to try Wellbutrin if possible.

## 2023-05-07 NOTE — Telephone Encounter (Signed)
Can you ask pt if she has ever had a seizure in her life and or if she ever had an eating disorder? If not we can start her on wellbutrin.   She would start taking 1 tablet once daily, and after three days would increase to twice daily. Stop smoking after 5-7 days of treatment.

## 2023-05-11 NOTE — Progress Notes (Signed)
No shoulder and thyroid are completing not related to one another.  We do not schedule these ultrasounds for the pt, she will need to call. The order is already in electronically for a thyroid ultrasound. Advise her she needs to call this # to get scheduled at her convenience.   Please call [phone] to schedule this appointment.

## 2023-05-30 ENCOUNTER — Other Ambulatory Visit: Payer: Self-pay | Admitting: Family

## 2023-09-01 ENCOUNTER — Telehealth: Payer: Self-pay | Admitting: Family

## 2023-09-01 NOTE — Telephone Encounter (Signed)
 Copied from CRM [id number]. Topic: Clinical - Medical Advice >> Sep 01, 2023 10:15 AM Sonny Dandy B wrote: Reason for CRM: pt called to speak with nurse. Requesting a call back. Pt states she does not have insurance, pt is requesting a call back at [phone] >> Sep 01, 2023 10:22 AM CMA Vicente Males C wrote: Wrong office

## 2023-10-06 ENCOUNTER — Telehealth: Payer: Self-pay | Admitting: Neurosurgery

## 2023-10-06 NOTE — Telephone Encounter (Signed)
 Patient's husband is calling for his wife.she has new insurance and we are in network. She was scheduled for surgery last year but it was cancelled because she was using a nicotine patch. She tried getting back in with Dr. Julane Ny this year but he retired. PT, injections, and MRI all done last year. Ok to schedule with Dr.Yarbrough?   MRI cervical 10/27/2022  IMPRESSION: 1. Mild progression since 2022 of chronic disc and endplate degeneration at C5-C6 and C6-C7. Bulky rightward disc osteophyte complex at C5-C6. Mild spinal stenosis with up to mild cord mass effect at both levels. No cord signal abnormality. Moderate to severe bilateral C6 and moderate left C7 neural foraminal stenosis. Query Right C6 radiculitis. 2. Stable similar chronic disc and endplate degeneration at C4-C5 with similar mild spinal stenosis, moderate to severe left greater than right C5 neural foraminal stenosis.

## 2023-10-07 NOTE — Telephone Encounter (Signed)
 Husband confirmed appt for 10/15/2023.

## 2023-10-14 NOTE — Progress Notes (Unsigned)
 Referring Physician:  No referring provider defined for this encounter.  Primary Physician:  [name], FNP  History of Present Illness: 10/15/2023 [name] is here today with a chief complaint of neck and arm pain.  She gets pain that extends from her neck to her shoulder blades down her arms on both sides.  She gets pain into her 1st through 4th fingers with numbness in her arms.  She also has left shoulder pain.  That bothers her with movement.  She has some weakness in her left arm.  She has tried physical therapy without improvement.  She presents today for consideration of surgical intervention.  Bowel/Bladder Dysfunction: none  Conservative measures:  Physical therapy: has participated in PT at [facility]  Multimodal medical therapy including regular antiinflammatories: Tylenol , Hydrocodone , tramadol  Injections: 02/12/2021 C7-T1 right Paramedian ESI  Past Surgery: none  Lauren Beck has no symptoms of cervical myelopathy.  The symptoms are causing a significant impact on the patient's life.   I have utilized the care everywhere function in epic to review the outside records available from external health systems.  Review of Systems:  A 10 point review of systems is negative, except for the pertinent positives and negatives detailed in the HPI.  Past Medical History: Past Medical History:  Diagnosis Date   Allergic rhinitis    Arthritis    Bronchitis    COPD (chronic obstructive pulmonary disease) (HCC)    Excessive or frequent menstruation    GERD (gastroesophageal reflux disease)    History of migraines    Hypothyroidism    Nonspecific abnormal electrocardiogram (ECG) (EKG)    Pure hypercholesterolemia    Tobacco use disorder    Unspecified adjustment reaction     Past Surgical History: Past Surgical History:  Procedure Laterality Date   HAMMER TOE SURGERY Bilateral    PELVIC LAPAROSCOPY     exploratory, thought might have a cyst, not seen    TUBAL LIGATION      Allergies: Allergies as of 10/15/2023 - Review Complete 10/15/2023  Allergen Reaction Noted   Benadryl [diphenhydramine hcl] Other (See Comments) 05/05/2014   Hydrocodone   06/17/2022   Levaquin  [levofloxacin ]  08/18/2022    Medications:  Current Outpatient Medications:    acetaminophen  (TYLENOL ) 500 MG tablet, Take 500 mg by mouth every 6 (six) hours as needed., Disp: , Rfl:    bismuth subsalicylate (PEPTO BISMOL) 262 MG/15ML suspension, Take 15 mLs by mouth every 6 (six) hours as needed for indigestion or diarrhea or loose stools., Disp: , Rfl:    cetirizine  (ZYRTEC ) 10 MG tablet, Take 1 tablet (10 mg total) by mouth daily., Disp: 90 tablet, Rfl: 3   Fexofenadine HCl (ALLEGRA ALLERGY PO), Take by mouth., Disp: , Rfl:    fluticasone  (FLONASE ) 50 MCG/ACT nasal spray, Place 2 sprays into both nostrils daily., Disp: 16 g, Rfl: 6   Fluticasone -Umeclidin-Vilant (TRELEGY ELLIPTA ) 100-62.5-25 MCG/ACT AEPB, Inhale 1 puff into the lungs daily., Disp: 14 each, Rfl: 0   Multiple Vitamin (MULTIVITAMIN WITH MINERALS) TABS tablet, Take 1 tablet by mouth daily., Disp: , Rfl:    pantoprazole  (PROTONIX ) 40 MG tablet, Take 1 tablet (40 mg total) by mouth 2 (two) times daily., Disp: 60 tablet, Rfl: 3   SYNTHROID  150 MCG tablet, Take one po qd for six days, do not take on day 7 (Patient not taking: Reported on 10/15/2023), Disp: 90 tablet, Rfl: 3  Social History: Social History   Tobacco Use   Smoking status: Every Day  Current packs/day: 1.50    Average packs/day: 1.5 packs/day for 45.3 years (68.0 ttl pk-yrs)    Types: Cigarettes    Start date: 1980   Smokeless tobacco: Never   Tobacco comments:    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, CMA  Vaping Use   Vaping status: Some Days   Substances: Nicotine   Substance Use Topics   Alcohol use: Yes    Alcohol/week: 1.0 standard drink of alcohol    Types: 1 Glasses of wine per week    Comment: occasional wine   Drug use: No     Comment: tried pot once, didn't like it    Family Medical History: Family History  Problem Relation Age of Onset   Other Father        No contact--unknown   Breast cancer Sister    Thyroid  disease Daughter    Anxiety disorder Daughter    OCD Daughter    Rectal cancer Neg Hx    Esophageal cancer Neg Hx    Stomach cancer Neg Hx    Colon cancer Neg Hx     Physical Examination: Vitals:   10/15/23 0938  BP: 100/68    General: Patient is in no apparent distress. Attention to examination is appropriate.  Neck:   Supple.  Full range of motion.  Respiratory: Patient is breathing without any difficulty.   NEUROLOGICAL:     Awake, alert, oriented to person, place, and time.  Speech is clear and fluent.   Cranial Nerves: Pupils equal round and reactive to light.  Facial tone is symmetric.  Facial sensation is symmetric. Shoulder shrug is symmetric. Tongue protrusion is midline.  There is no pronator drift.  Strength: Side Biceps Triceps Deltoid Interossei Grip Wrist Ext. Wrist Flex.  R 5 5 5 5 5 5 5   L 4+ 4- 4+ 4+ 5 5 5    Side Iliopsoas Quads Hamstring PF DF EHL  R 5 5 5 5 5 5   L 5 5 5 5 5 5    Reflexes are 2+ and symmetric at the biceps, triceps, brachioradialis, patella and achilles.   Hoffman's is absent.   Bilateral upper and lower extremity sensation is intact to light touch.    No evidence of dysmetria noted.  Gait is normal.    +TTP L anterior shoulder. + L shoulder pain with ROM  Medical Decision Making  Imaging: MRI C spine 10/27/2022 Disc levels:   C2-C3:  Mild facet hypertrophy on the left.  No stenosis.   C3-C4: Minimal disc bulging and mild facet hypertrophy appears stable. Mild left C4 neural foraminal stenosis is stable.   C4-C5: Disc space loss and circumferential disc bulge at this level with a broad-based posterior component. Left greater than right foraminal disc osteophyte complex. Mild facet and ligament flavum hypertrophy. Mild spinal  stenosis. No convincing cord mass effect. Moderate to severe left and mild-to-moderate right C5 neural foraminal stenosis. This level has not significantly changed.   C5-C6: Disc space loss with circumferential disc bulging and endplate spurring, but bulky and mildly lobulated right lateral recess component on series 8, image 21. Superimposed mild facet and ligament flavum hypertrophy. Mild spinal stenosis and cord mass effect. Moderate to severe bilateral C6 neural foraminal stenosis. This level has mildly progressed since 2022.   C6-C7: Circumferential disc bulge with mild endplate spurring. Mild facet and ligament flavum hypertrophy. Mild spinal stenosis. Mild if any cord mass effect. Mild to moderate left C7 foraminal stenosis. This level has mildly progressed.  C7-T1:  Mild facet hypertrophy.  No stenosis.   Negative visible upper thoracic levels.   IMPRESSION: 1. Mild progression since 2022 of chronic disc and endplate degeneration at C5-C6 and C6-C7. Bulky rightward disc osteophyte complex at C5-C6. Mild spinal stenosis with up to mild cord mass effect at both levels. No cord signal abnormality. Moderate to severe bilateral C6 and moderate left C7 neural foraminal stenosis. Query Right C6 radiculitis. 2. Stable similar chronic disc and endplate degeneration at C4-C5 with similar mild spinal stenosis, moderate to severe left greater than right C5 neural foraminal stenosis.     Electronically Signed   By: Marlise Simpers M.D.   On: 10/31/2022 13:21  I have personally reviewed the images and agree with the above interpretation.  Assessment and Plan: Ms. Oregel is a pleasant 64 y.o. female with cervical radiculopathy due to cervical stenosis.  She is also having left shoulder pain.  She has tried and failed conservative management.  She has multilevel neuroforaminal stenosis between C4 and C7.  She has left-sided C7 mediated symptoms where she has moderate stenosis as  well.  There is no further role for conservative management.  I recommended C4-7 anterior cervical discectomy and fusion.  I discussed the planned procedure at length with the patient, including the risks, benefits, alternatives, and indications. The risks discussed include but are not limited to bleeding, infection, need for reoperation, spinal fluid leak, stroke, vision loss, anesthetic complication, coma, paralysis, and even death. We also discussed the possibility of post-operative dysphagia, vocal cord paralysis, and the risk of adjacent segment disease in the future. I also described in detail that improvement was not guaranteed.  The patient expressed understanding of these risks, and asked that we proceed with surgery. I described the surgery in layman's terms, and gave ample opportunity for questions, which were answered to the best of my ability.  We will refer her for orthopedic evaluation of her shoulder after she has recovered.      Thank you for involving me in the care of this patient.      Lenix Kidd K. Mont Antis MD, [facility] Neurosurgery

## 2023-10-14 NOTE — H&P (View-Only) (Signed)
 Referring Physician:  No referring provider defined for this encounter.  Primary Physician:  [name], FNP  History of Present Illness: 10/15/2023 Lauren Beck is here today with a chief complaint of neck and arm pain.  She gets pain that extends from her neck to her shoulder blades down her arms on both sides.  She gets pain into her 1st through 4th fingers with numbness in her arms.  She also has left shoulder pain.  That bothers her with movement.  She has some weakness in her left arm.  She has tried physical therapy without improvement.  She presents today for consideration of surgical intervention.  Bowel/Bladder Dysfunction: none  Conservative measures:  Physical therapy: has participated in PT at [facility]  Multimodal medical therapy including regular antiinflammatories: Tylenol , Hydrocodone , tramadol  Injections: 02/12/2021 C7-T1 right Paramedian ESI  Past Surgery: none  Xyla Buntrock has no symptoms of cervical myelopathy.  The symptoms are causing a significant impact on the patient's life.   I have utilized the care everywhere function in epic to review the outside records available from external health systems.  Review of Systems:  A 10 point review of systems is negative, except for the pertinent positives and negatives detailed in the HPI.  Past Medical History: Past Medical History:  Diagnosis Date   Allergic rhinitis    Arthritis    Bronchitis    COPD (chronic obstructive pulmonary disease) (HCC)    Excessive or frequent menstruation    GERD (gastroesophageal reflux disease)    History of migraines    Hypothyroidism    Nonspecific abnormal electrocardiogram (ECG) (EKG)    Pure hypercholesterolemia    Tobacco use disorder    Unspecified adjustment reaction     Past Surgical History: Past Surgical History:  Procedure Laterality Date   HAMMER TOE SURGERY Bilateral    PELVIC LAPAROSCOPY     exploratory, thought might have a cyst, not seen    TUBAL LIGATION      Allergies: Allergies as of 10/15/2023 - Review Complete 10/15/2023  Allergen Reaction Noted   Benadryl [diphenhydramine hcl] Other (See Comments) 05/05/2014   Hydrocodone   06/17/2022   Levaquin  [levofloxacin ]  08/18/2022    Medications:  Current Outpatient Medications:    acetaminophen  (TYLENOL ) 500 MG tablet, Take 500 mg by mouth every 6 (six) hours as needed., Disp: , Rfl:    bismuth subsalicylate (PEPTO BISMOL) 262 MG/15ML suspension, Take 15 mLs by mouth every 6 (six) hours as needed for indigestion or diarrhea or loose stools., Disp: , Rfl:    cetirizine  (ZYRTEC ) 10 MG tablet, Take 1 tablet (10 mg total) by mouth daily., Disp: 90 tablet, Rfl: 3   Fexofenadine HCl (ALLEGRA ALLERGY PO), Take by mouth., Disp: , Rfl:    fluticasone  (FLONASE ) 50 MCG/ACT nasal spray, Place 2 sprays into both nostrils daily., Disp: 16 g, Rfl: 6   Fluticasone -Umeclidin-Vilant (TRELEGY ELLIPTA ) 100-62.5-25 MCG/ACT AEPB, Inhale 1 puff into the lungs daily., Disp: 14 each, Rfl: 0   Multiple Vitamin (MULTIVITAMIN WITH MINERALS) TABS tablet, Take 1 tablet by mouth daily., Disp: , Rfl:    pantoprazole  (PROTONIX ) 40 MG tablet, Take 1 tablet (40 mg total) by mouth 2 (two) times daily., Disp: 60 tablet, Rfl: 3   SYNTHROID  150 MCG tablet, Take one po qd for six days, do not take on day 7 (Patient not taking: Reported on 10/15/2023), Disp: 90 tablet, Rfl: 3  Social History: Social History   Tobacco Use   Smoking status: Every Day  Current packs/day: 1.50    Average packs/day: 1.5 packs/day for 45.3 years (68.0 ttl pk-yrs)    Types: Cigarettes    Start date: 1980   Smokeless tobacco: Never   Tobacco comments:    Currently 1/2 ppd 02/28/22 Cala Castleman, CMA  Vaping Use   Vaping status: Some Days   Substances: Nicotine   Substance Use Topics   Alcohol use: Yes    Alcohol/week: 1.0 standard drink of alcohol    Types: 1 Glasses of wine per week    Comment: occasional wine   Drug use: No     Comment: tried pot once, didn't like it    Family Medical History: Family History  Problem Relation Age of Onset   Other Father        No contact--unknown   Breast cancer Sister    Thyroid  disease Daughter    Anxiety disorder Daughter    OCD Daughter    Rectal cancer Neg Hx    Esophageal cancer Neg Hx    Stomach cancer Neg Hx    Colon cancer Neg Hx     Physical Examination: Vitals:   10/15/23 0938  BP: 100/68    General: Patient is in no apparent distress. Attention to examination is appropriate.  Neck:   Supple.  Full range of motion.  Respiratory: Patient is breathing without any difficulty.   NEUROLOGICAL:     Awake, alert, oriented to person, place, and time.  Speech is clear and fluent.   Cranial Nerves: Pupils equal round and reactive to light.  Facial tone is symmetric.  Facial sensation is symmetric. Shoulder shrug is symmetric. Tongue protrusion is midline.  There is no pronator drift.  Strength: Side Biceps Triceps Deltoid Interossei Grip Wrist Ext. Wrist Flex.  R 5 5 5 5 5 5 5   L 4+ 4- 4+ 4+ 5 5 5    Side Iliopsoas Quads Hamstring PF DF EHL  R 5 5 5 5 5 5   L 5 5 5 5 5 5    Reflexes are 2+ and symmetric at the biceps, triceps, brachioradialis, patella and achilles.   Hoffman's is absent.   Bilateral upper and lower extremity sensation is intact to light touch.    No evidence of dysmetria noted.  Gait is normal.    +TTP L anterior shoulder. + L shoulder pain with ROM  Medical Decision Making  Imaging: MRI C spine 10/27/2022 Disc levels:   C2-C3:  Mild facet hypertrophy on the left.  No stenosis.   C3-C4: Minimal disc bulging and mild facet hypertrophy appears stable. Mild left C4 neural foraminal stenosis is stable.   C4-C5: Disc space loss and circumferential disc bulge at this level with a broad-based posterior component. Left greater than right foraminal disc osteophyte complex. Mild facet and ligament flavum hypertrophy. Mild spinal  stenosis. No convincing cord mass effect. Moderate to severe left and mild-to-moderate right C5 neural foraminal stenosis. This level has not significantly changed.   C5-C6: Disc space loss with circumferential disc bulging and endplate spurring, but bulky and mildly lobulated right lateral recess component on series 8, image 21. Superimposed mild facet and ligament flavum hypertrophy. Mild spinal stenosis and cord mass effect. Moderate to severe bilateral C6 neural foraminal stenosis. This level has mildly progressed since 2022.   C6-C7: Circumferential disc bulge with mild endplate spurring. Mild facet and ligament flavum hypertrophy. Mild spinal stenosis. Mild if any cord mass effect. Mild to moderate left C7 foraminal stenosis. This level has mildly progressed.  C7-T1:  Mild facet hypertrophy.  No stenosis.   Negative visible upper thoracic levels.   IMPRESSION: 1. Mild progression since 2022 of chronic disc and endplate degeneration at C5-C6 and C6-C7. Bulky rightward disc osteophyte complex at C5-C6. Mild spinal stenosis with up to mild cord mass effect at both levels. No cord signal abnormality. Moderate to severe bilateral C6 and moderate left C7 neural foraminal stenosis. Query Right C6 radiculitis. 2. Stable similar chronic disc and endplate degeneration at C4-C5 with similar mild spinal stenosis, moderate to severe left greater than right C5 neural foraminal stenosis.     Electronically Signed   By: Marlise Simpers M.D.   On: 10/31/2022 13:21  I have personally reviewed the images and agree with the above interpretation.  Assessment and Plan: Ms. Bouwkamp is a pleasant 63 y.o. female with cervical radiculopathy due to cervical stenosis.  She is also having left shoulder pain.  She has tried and failed conservative management.  She has multilevel neuroforaminal stenosis between C4 and C7.  She has left-sided C7 mediated symptoms where she has moderate stenosis as  well.  There is no further role for conservative management.  I recommended C4-7 anterior cervical discectomy and fusion.  I discussed the planned procedure at length with the patient, including the risks, benefits, alternatives, and indications. The risks discussed include but are not limited to bleeding, infection, need for reoperation, spinal fluid leak, stroke, vision loss, anesthetic complication, coma, paralysis, and even death. We also discussed the possibility of post-operative dysphagia, vocal cord paralysis, and the risk of adjacent segment disease in the future. I also described in detail that improvement was not guaranteed.  The patient expressed understanding of these risks, and asked that we proceed with surgery. I described the surgery in layman's terms, and gave ample opportunity for questions, which were answered to the best of my ability.  We will refer her for orthopedic evaluation of her shoulder after she has recovered.      Thank you for involving me in the care of this patient.      Jasin Brazel K. Mont Antis MD, [facility] Neurosurgery

## 2023-10-15 ENCOUNTER — Encounter: Payer: Self-pay | Admitting: Neurosurgery

## 2023-10-15 ENCOUNTER — Ambulatory Visit (INDEPENDENT_AMBULATORY_CARE_PROVIDER_SITE_OTHER): Admitting: Neurosurgery

## 2023-10-15 ENCOUNTER — Telehealth: Payer: Self-pay | Admitting: Family

## 2023-10-15 VITALS — BP 100/68 | Ht 61.0 in | Wt 145.0 lb

## 2023-10-15 DIAGNOSIS — M5412 Radiculopathy, cervical region: Secondary | ICD-10-CM | POA: Diagnosis not present

## 2023-10-15 DIAGNOSIS — M25512 Pain in left shoulder: Secondary | ICD-10-CM | POA: Diagnosis not present

## 2023-10-15 DIAGNOSIS — R29898 Other symptoms and signs involving the musculoskeletal system: Secondary | ICD-10-CM

## 2023-10-15 DIAGNOSIS — G8929 Other chronic pain: Secondary | ICD-10-CM

## 2023-10-15 DIAGNOSIS — M4802 Spinal stenosis, cervical region: Secondary | ICD-10-CM | POA: Diagnosis not present

## 2023-10-15 NOTE — Patient Instructions (Signed)
 Please see below for information in regards to your upcoming surgery:   Planned surgery: C4-7 anterior cervical discectomy and fusion   Surgery date: 10/28/23 at [facility] (Medical Mall: [address], [place], [place] [address]) - you will find out your arrival time the business day before your surgery.   Pre-op appointment at [facility] Pre-admit Testing: you will receive a call with a date/time for this appointment. If you are scheduled for an in person appointment, Pre-admit Testing is located on the first floor of the [facility] building, [address], [address]. During this appointment, they will advise you which medications you can take the morning of surgery, and which medications you will need to hold for surgery. Labs (such as blood work, EKG) may be done at your pre-op appointment. You are not required to fast for these labs. Should you need to change your pre-op appointment, please call Pre-admit testing at [phone].      Surgical clearance: we will send a clearance form to [name], FNP. They may wish to see you in their office prior to signing the clearance form. If so, they may call you to schedule an appointment.     Brace: You will need to bring the brace to the hospital on the day of surgery. [facility] will contact you regarding an appointment for the brace you will use after surgery. If it is getting close to your surgery date and you have not received an appointment with [facility], please reach out to us .  Their number is [phone] should you miss their call or have an issue with your brace after surgery.     NSAIDS (Non-steroidal anti-inflammatory drugs): because you are having a fusion, please avoid taking any NSAIDS (examples: ibuprofen, motrin, aleve , naproxen , meloxicam , diclofenac) for 3 months after surgery. Celebrex is an exception and is OK to take, if prescribed. Tylenol  is not an NSAID.    Common  restrictions after surgery: No bending, lifting, or twisting ("BLT"). Avoid lifting objects heavier than 10 pounds for the first 6 weeks after surgery. Where possible, avoid household activities that involve lifting, bending, reaching, pushing, or pulling such as laundry, vacuuming, grocery shopping, and childcare. Try to arrange for help from friends and family for these activities while you heal. Do not drive while taking prescription pain medication. Weeks 6 through 12 after surgery: avoid lifting more than 25 pounds.    X-rays after surgery: Because you are having a fusion: for appointments after your 2 week follow-up: please arrive at the [facility] outpatient imaging center ([address], [address], [place]) or [facility] one hour prior to your appointment for x-rays. This applies to every appointment after your 2 week follow-up. Failure to do so may result in your appointment being rescheduled.   How to contact us :  If you have any questions/concerns before or after surgery, you can reach us  at [phone], or you can send a mychart message. We can be reached by phone or mychart 8am-4pm, Monday-Friday.  *Please note: Calls after 4pm are forwarded to a third party answering service. Mychart messages are not routinely monitored during evenings, weekends, and holidays. Please call our office to contact the answering service for urgent concerns during non-business hours.     If you have FMLA/disability paperwork, please drop it off or fax it to [phone], attention Patty.   Appointments/FMLA & disability paperwork: Gerlean Kocher, & Maryann Smalls Registered Nurses/Surgery schedulers: Zakirah Weingart & Lauren Medical Assistants:  Donnajean Fuse Physician Assistants: Ludwig Safer, PA-C, Anastacio Karvonen, PA-C & Lucetta Russel, PA-C Surgeons: Jodeen Munch, MD & Henderson Lock, MD   [facility] PREADMIT TESTING VISIT and SURGERY INFORMATION SHEET   Now  that surgery has been scheduled you can anticipate several phone calls from [facility] services. A pharmacy technician will call you to verify your current list of medications taken at home.               The Pre-Service Center will call to verify your insurance information and to give you billing estimates and information.             The Preadmit Testing Office will be calling to schedule a visit to obtain information for the anesthesia team and provide instructions on preparation for surgery.  What can you expect for the Preadmit Testing Visit: Appointments may be scheduled in-person or by telephone.  If a telephone visit is scheduled, you may be asked to come into the office to have lab tests or other studies performed.   This visit will not be completed any greater than 14 days prior to your surgery.  If your surgery has been scheduled for a future date, please do not be alarmed if we have not contacted you to schedule an appointment more than a month prior to the surgery date.    Please be prepared to provide the following information during this appointment:            -Personal medical history                                               -Medication and allergy list            -Any history of problems with anesthesia              -Recent lab work or diagnostic studies            -Please notify us  of any needs we should be aware of to provide the best care possible           -You will be provided with instructions on how to prepare for your surgery.    On The Day of Surgery:  You must have a driver to take you home after surgery, you will be asked not to drive for 24 hours following surgery.  Taxi, [employer] and non-medical transport will not be acceptable means of transportation unless you have a responsible individual who will be traveling with you.  Visitors in the surgical area:   2 people will be able to visit you in your room once your preparation for surgery has been completed.  During surgery, your visitors will be asked to wait in the Surgery Waiting Area.  It is not a requirement for them to stay, if they prefer to leave and come back.  Your visitor(s) will be given an update once the surgery has been completed.  No visitors are allowed in the initial recovery room to respect patient privacy and safety.  Once you are more awake and transfer to the secondary recovery area, or are transferred to an inpatient room, visitors will again be able to see you.  To respect and protect your privacy: We will ask on the day of surgery who your driver will be and what the contact number for  that individual will be. We will ask if it is okay to share information with this individual, or if there is an alternative individual that we, or the surgeon, should contact to provide updates and information. If family or friends come to the surgical information desk requesting information about you, who you have not listed with us , no information will be given.   It may be helpful to designate someone as the main contact who will be responsible for updating your other friends and family.    PREADMIT TESTING OFFICE: [phone] SAME DAY SURGERY: [phone] We look forward to caring for you before and throughout the process of your surgery.

## 2023-10-15 NOTE — Addendum Note (Signed)
 Addended by: Dewey Viens on: 10/15/2023 01:52 PM   Modules accepted: Orders

## 2023-10-15 NOTE — Telephone Encounter (Signed)
 Patient already scheduled to come in Monday, April 28.

## 2023-10-15 NOTE — Telephone Encounter (Signed)
 Received a fax from Neurosurgery at [facility] needing surgical clearance done for the pt. Please contact her to get her scheduled. Thank you!!

## 2023-10-15 NOTE — Addendum Note (Signed)
 Addended by: Khylah Kendra on: 10/15/2023 10:53 AM   Modules accepted: Orders

## 2023-10-19 ENCOUNTER — Ambulatory Visit (INDEPENDENT_AMBULATORY_CARE_PROVIDER_SITE_OTHER): Admitting: Family

## 2023-10-19 ENCOUNTER — Encounter: Payer: Self-pay | Admitting: Family

## 2023-10-19 ENCOUNTER — Telehealth: Payer: Self-pay | Admitting: Family

## 2023-10-19 ENCOUNTER — Ambulatory Visit (INDEPENDENT_AMBULATORY_CARE_PROVIDER_SITE_OTHER)
Admission: RE | Admit: 2023-10-19 | Discharge: 2023-10-19 | Disposition: A | Discharge status: Home / Self Care | Source: Ambulatory Visit | Attending: Family

## 2023-10-19 VITALS — BP 118/76 | HR 67 | Temp 98.0°F | Ht 61.0 in | Wt 145.6 lb

## 2023-10-19 DIAGNOSIS — M5412 Radiculopathy, cervical region: Secondary | ICD-10-CM

## 2023-10-19 DIAGNOSIS — J4489 Other specified chronic obstructive pulmonary disease: Secondary | ICD-10-CM

## 2023-10-19 DIAGNOSIS — F17200 Nicotine dependence, unspecified, uncomplicated: Secondary | ICD-10-CM | POA: Diagnosis not present

## 2023-10-19 DIAGNOSIS — M4692 Unspecified inflammatory spondylopathy, cervical region: Secondary | ICD-10-CM

## 2023-10-19 DIAGNOSIS — R7303 Prediabetes: Secondary | ICD-10-CM | POA: Diagnosis not present

## 2023-10-19 DIAGNOSIS — Z01818 Encounter for other preprocedural examination: Secondary | ICD-10-CM | POA: Diagnosis not present

## 2023-10-19 DIAGNOSIS — Z720 Tobacco use: Secondary | ICD-10-CM | POA: Insufficient documentation

## 2023-10-19 DIAGNOSIS — E782 Mixed hyperlipidemia: Secondary | ICD-10-CM

## 2023-10-19 DIAGNOSIS — E039 Hypothyroidism, unspecified: Secondary | ICD-10-CM | POA: Diagnosis not present

## 2023-10-19 LAB — URINALYSIS, ROUTINE W REFLEX MICROSCOPIC
Bilirubin Urine: NEGATIVE
Hgb urine dipstick: NEGATIVE
Ketones, ur: NEGATIVE
Leukocytes,Ua: NEGATIVE
Nitrite: NEGATIVE
RBC / HPF: NONE SEEN (ref 0–?)
Specific Gravity, Urine: <=1.005 — AB (ref 1.000–1.030)
Total Protein, Urine: NEGATIVE
Urine Glucose: NEGATIVE
Urobilinogen, UA: 0.2 (ref 0.0–1.0)
WBC, UA: NONE SEEN (ref 0–?)
pH: 6 (ref 5.0–8.0)

## 2023-10-19 LAB — CBC
HCT: 40.5 % (ref 36.0–46.0)
Hemoglobin: 13.7 g/dL (ref 12.0–15.0)
MCHC: 34 g/dL (ref 30.0–36.0)
MCV: 99.8 fl (ref 78.0–100.0)
Platelets: 293 10*3/uL (ref 150.0–400.0)
RBC: 4.05 Mil/uL (ref 3.87–5.11)
RDW: 13.8 % (ref 11.5–15.5)
WBC: 6.4 10*3/uL (ref 4.0–10.5)

## 2023-10-19 LAB — COMPREHENSIVE METABOLIC PANEL WITH GFR
ALT: 20 U/L (ref 0–35)
AST: 19 U/L (ref 0–37)
Albumin: 4.5 g/dL (ref 3.5–5.2)
Alkaline Phosphatase: 122 U/L — ABNORMAL HIGH (ref 39–117)
BUN: 21 mg/dL (ref 6–23)
CO2: 28 meq/L (ref 19–32)
Calcium: 9.6 mg/dL (ref 8.4–10.5)
Chloride: 102 meq/L (ref 96–112)
Creatinine, Ser: 1.02 mg/dL (ref 0.40–1.20)
GFR: 59.12 mL/min — ABNORMAL LOW (ref >60.00)
Glucose, Bld: 82 mg/dL (ref 70–99)
Potassium: 4.2 meq/L (ref 3.5–5.1)
Sodium: 138 meq/L (ref 135–145)
Total Bilirubin: 0.4 mg/dL (ref 0.2–1.2)
Total Protein: 7.3 g/dL (ref 6.0–8.3)

## 2023-10-19 LAB — HEMOGLOBIN A1C: Hgb A1c MFr Bld: 6.1 % (ref 4.6–6.5)

## 2023-10-19 LAB — PROTIME-INR
INR: 0.9 ratio (ref 0.8–1.0)
Prothrombin Time: 9.9 s (ref 9.6–13.1)

## 2023-10-19 MED ORDER — SYNTHROID 150 MCG PO TABS: ORAL_TABLET | ORAL | 0 refills | Status: DC

## 2023-10-19 MED ORDER — ATORVASTATIN CALCIUM 10 MG PO TABS: 10.0000 mg | ORAL_TABLET | Freq: Every day | ORAL | 3 refills | Status: DC

## 2023-10-19 NOTE — Progress Notes (Addendum)
 Assessment & Plan:  Tobacco abuse Assessment & Plan: Smoking cessation instruction/counseling given:  counseled patient on the dangers of tobacco use, advised patient to stop smoking, and reviewed strategies to maximize successdid advise pt how this will increase risk for surgery, pt verbalized understanding.   Orders: -     DG Chest 2 View; Future  Acquired hypothyroidism Assessment & Plan: Tsh today pending results.  This will not affect surgery, continue with synthroid  take 6 days off day 7  Orders: -     Synthroid ; Take one po qd for six days, do not take on day 7  Dispense: 90 tablet; Refill: 0 -     TSH; Future  Prediabetes -     Hemoglobin A1c  Tobacco use disorder  Pre-operative examination for internal medicine Assessment & Plan: Lab work unremarkable, will not delay clearance. CXR stable without acute cardiopulmonary concerns however there is chronic scarring and emphysema, so will have pt obtain pulmonary clearance prior to being cleared for surgery.  EKG with T wave inversion , non specific no real pattern, reached out for cardiology consult and as pt is asymptomatic reasonable to proceed with surgery without cardiology eval.  Orders: -     Protime-INR -     Comprehensive metabolic panel with GFR -     CBC -     EKG 12-Lead -     Urinalysis, Routine w reflex microscopic; Future  COPD (chronic obstructive pulmonary disease) with chronic bronchitis (HCC) -     DG Chest 2 View; Future  Mixed hyperlipidemia Assessment & Plan: LDL at goal.  Work on low cholesterol diet and exercise as tolerated Continue atorvastatin  10 mg nightly  Orders: -     Atorvastatin  Calcium ; Take 1 tablet (10 mg total) by mouth daily.  Dispense: 90 tablet; Refill: 3  Cervical spondylitis with radiculitis (HCC)  Addendum 10/22/23: pt cleared by pulmonary. Cleared by primary care as well.   I have independently evaluated patient.  Lauren Beck is a 63 y.o. female who is moderate risk  for a intermediate risk surgery.  There are modifiable risk factors (smoking, etc). Pt is a smoker, has attempted to quit smoking but unsuccessful. She has cut down however on her daily cigarettes, using 4-5 cigarettes a day.   [name]'s RCRI/NSQIP calculation for MACE is: 2.    Review meds: no change  Return if symptoms worsen or fail to improve.  [name], MSN, APRN, FNP-C [facility]  Subjective:      HPI: Pt is a 63 y.o. female who is here for preoperative clearance for anterior cervical decompression discectomy fusion 3 levels under general anesthesia. The surgery is scheduled for 5/7  1) High Risk Cardiac Conditions:  1) Recent MI - No.  2) Decompensated Heart Failure - No.  3) Unstable angina - No.  4) Symptomatic arrythmia - No.  5) Sx Valvular Disease - No.  2) Intermediate Risk Factors: DM, CKD, CVA, CHF, CAD - No.  2) Functional Status: > 4 mets (Walk, run, climb stairs) Yes.  [name] Activity Status Index: 6.76  3) Surgery Specific Risk: High (Emergency, Vascular, Intra-abdominal, Extensive ops)          Intermediate (Carotid, Head and Neck, Orthopaedic )          Low (Endoscopic, Cataract, Breast )  4) Further Noninvasive evaluation:   1) EKG - Yes.     Hx of MI, CVA, CAD, DM, CKD  2) Echo -  No.   Worsening dyspnea or CHF without an echo in the past year  3) Stress Testing - Active Cardiac Disease - No.  4) CXR Yes.     If asymptomatic, healthy, no respiratory symptoms it is not needed  5)PFTs No.   OSA, OHS, or significant cardiopulmonary history  5) Need for medical therapy - Beta Blocker, Statins indicated ? No.  Pulmonary visit 6/24 was cleared for surgery when originally dated.   New complaints: Hypothyroid:  ran out of insurance and now back on it so has not been on synthroid  since 12/24.   Wt Readings from Last 3 Encounters:  10/19/23 145 lb 9.6 oz (66 kg)  10/15/23 145 lb (65.8 kg)  04/27/23 140 lb (63.5 kg)    Lab Results  Component Value Date   CHOL 177 03/18/2023   HDL 56.80 03/18/2023   LDLCALC 82 03/18/2023   LDLDIRECT 137.8 02/29/2008   TRIG 191.0 (H) 03/18/2023   CHOLHDL 3 03/18/2023    Social history:  Relevant past medical, surgical, family and social history reviewed and updated as indicated. Interim medical history since our last visit reviewed.  Allergies and medications reviewed and updated.  DATA REVIEWED: CHART IN EPIC  ROS: Negative unless specifically indicated above in HPI.    Current Outpatient Medications:    acetaminophen  (TYLENOL ) 500 MG tablet, Take 500 mg by mouth every 6 (six) hours as needed., Disp: , Rfl:    atorvastatin  (LIPITOR) 10 MG tablet, Take 1 tablet (10 mg total) by mouth daily., Disp: 90 tablet, Rfl: 3   bismuth subsalicylate (PEPTO BISMOL) 262 MG/15ML suspension, Take 15 mLs by mouth every 6 (six) hours as needed for indigestion or diarrhea or loose stools., Disp: , Rfl:    cetirizine  (ZYRTEC ) 10 MG tablet, Take 1 tablet (10 mg total) by mouth daily., Disp: 90 tablet, Rfl: 3   Fexofenadine HCl (ALLEGRA ALLERGY PO), Take by mouth., Disp: , Rfl:    fluticasone  (FLONASE ) 50 MCG/ACT nasal spray, Place 2 sprays into both nostrils daily., Disp: 16 g, Rfl: 6   Fluticasone -Umeclidin-Vilant (TRELEGY ELLIPTA ) 100-62.5-25 MCG/ACT AEPB, Inhale 1 puff into the lungs daily., Disp: 14 each, Rfl: 0   Multiple Vitamin (MULTIVITAMIN WITH MINERALS) TABS tablet, Take 1 tablet by mouth daily., Disp: , Rfl:    pantoprazole  (PROTONIX ) 40 MG tablet, Take 1 tablet (40 mg total) by mouth 2 (two) times daily., Disp: 60 tablet, Rfl: 3   SYNTHROID  150 MCG tablet, Take one po qd for six days, do not take on day 7, Disp: 90 tablet, Rfl: 0      Objective:    BP 118/76 (BP Location: Left Arm, Patient Position: Sitting, Cuff Size: Normal)   Pulse 67   Temp 98 F (36.7 C) (Temporal)   Ht 5\' 1"  (1.549 m)   Wt 145 lb 9.6 oz (66 kg)   LMP 04/06/2012   SpO2 99%   BMI 27.51  kg/m   Wt Readings from Last 3 Encounters:  10/19/23 145 lb 9.6 oz (66 kg)  10/15/23 145 lb (65.8 kg)  04/27/23 140 lb (63.5 kg)    Physical Exam Constitutional:      General: She is not in acute distress.    Appearance: Normal appearance. She is normal weight. She is not ill-appearing.  HENT:     Head: Normocephalic.     Right Ear: Tympanic membrane normal.     Left Ear: Tympanic membrane normal.     Nose: Nose normal.  Mouth/Throat:     Mouth: Mucous membranes are moist.  Eyes:     Extraocular Movements: Extraocular movements intact.     Pupils: Pupils are equal, round, and reactive to light.  Cardiovascular:     Rate and Rhythm: Normal rate and regular rhythm.  Pulmonary:     Effort: Pulmonary effort is normal.     Breath sounds: Normal breath sounds.  Abdominal:     General: Abdomen is flat. Bowel sounds are normal.     Palpations: Abdomen is soft.     Tenderness: There is no guarding or rebound.  Musculoskeletal:        General: Normal range of motion.     Cervical back: Normal range of motion.  Skin:    General: Skin is warm.     Capillary Refill: Capillary refill takes less than 2 seconds.  Neurological:     General: No focal deficit present.     Mental Status: She is alert.  Psychiatric:        Mood and Affect: Mood normal.        Behavior: Behavior normal.        Thought Content: Thought content normal.        Judgment: Judgment normal.

## 2023-10-19 NOTE — Assessment & Plan Note (Signed)
 Lab work unremarkable, will not delay clearance. CXR stable without acute cardiopulmonary concerns however there is chronic scarring and emphysema, so will have pt obtain pulmonary clearance prior to being cleared for surgery.  EKG with T wave inversion , non specific no real pattern, reached out for cardiology consult and as pt is asymptomatic reasonable to proceed with surgery without cardiology eval.

## 2023-10-19 NOTE — Progress Notes (Signed)
 Chest xray with no acute cardiopulmonary process. Suitable for pre op clearance.

## 2023-10-19 NOTE — Telephone Encounter (Signed)
 Pt was made aware of this information when she was here at her appointment.

## 2023-10-19 NOTE — Telephone Encounter (Signed)
 Can we call Dr. Jenny Mohs office and ask if pt would still be cleared for this surgery?  Clearance in June 2024 for the same surgery, surgery was just delayed until 4/25 Cleared or does she needs a pre op clearance with him?

## 2023-10-19 NOTE — Telephone Encounter (Signed)
 I have sent a message over to [facility]. Dr. Jenny Mohs is no longer with their office.

## 2023-10-19 NOTE — Telephone Encounter (Signed)
 Advise pt to call pulmonary and schedule a pulmonary clearance prior to surgery.

## 2023-10-20 ENCOUNTER — Telehealth: Payer: Self-pay

## 2023-10-20 NOTE — Telephone Encounter (Signed)
 I called Lauren Beck to discuss her PCP Lauren Horns NP's recommendations to follow up with Pulmonologist for clarification about whether she is safe to proceed with her upcoming surgery with Lauren Beck.   Patient states that she is unable to be seen by the pulmonologist who cleared her for her procedure that was scheduled originally for 01/14/2023 with Lauren Beck [facility]). [place] area.   Patient argues that she should not need to be reassessed by her old pulmonologist or a new one since she was seen last year and told that she did not need to follow up with them unless she had any exacerbation of her COPD. We told patient that this was not our requirement and we must follow the medical judgement of her primary care provider.   Patient was educated that we need clearance from her Primary care provider and that if [name] recommends re-clearance from a pulmonary specialist that we will be unable to proceed with her planned surgery until this is completed.   Patient states she wants to go to her PCP to get clarification about if she needs another pulmonary visit since she was seen within the last year. Per Lauren Beck, if this is recommended by her PCP then she must be cleared in order to have surgery. Lauren Caroline NP at [facility] pre-op is in agreement that if [name]'s medical judgement is pulmonary clearance prior then we will hold off until this is obtained.   Patient also asked us  why she may have issues with insurance getting a C-collar for after surgery.  Lauren J RN and I explained to the patient that we do not do the insurance claim for the neck braces and that the [facility] are responsible for this. She asks what the plan is if they deny the claim, we reiterated that [facility] will submit any appeals and deal with all insurance related claims for this device.

## 2023-10-21 ENCOUNTER — Telehealth: Payer: Self-pay | Admitting: *Deleted

## 2023-10-21 ENCOUNTER — Telehealth: Payer: Self-pay | Admitting: Family

## 2023-10-21 ENCOUNTER — Ambulatory Visit: Admitting: Family

## 2023-10-21 ENCOUNTER — Other Ambulatory Visit (INDEPENDENT_AMBULATORY_CARE_PROVIDER_SITE_OTHER)

## 2023-10-21 DIAGNOSIS — E039 Hypothyroidism, unspecified: Secondary | ICD-10-CM | POA: Diagnosis not present

## 2023-10-21 LAB — TSH: TSH: 156.3 u[IU]/mL — ABNORMAL HIGH (ref 0.35–5.50)

## 2023-10-21 NOTE — Assessment & Plan Note (Addendum)
 LDL at goal.  Work on low cholesterol diet and exercise as tolerated Continue atorvastatin  10 mg nightly

## 2023-10-21 NOTE — Telephone Encounter (Signed)
 See other telephone encounter.

## 2023-10-21 NOTE — Telephone Encounter (Signed)
 Pt will still require pulmonary eval, on cxr she still has noted emphysema and no acute findings however she does have chronic scarring and thickening of the lung tissue between the air sacs (alveoli) which can can lead to stiffness in the lungs, making it difficult to breathe and take in oxygen This makes it harder for oxygen to move out of the lungs and into the bloodstream and for carbon dioxide to move out of the bloodstream and into the lungs.   When it comes to general anesthesia, because of these findings pt will have an increased risk of complications when undergoing surgery. These complications can include acute exacerbations of ILD, respiratory failure, pneumonia, and even death and increases risk for aspiration   With any surgery that requires general anesthesia because of emphysema she is at an increased risk for respiratory complications including respiratory failure that could cause prolonged intubation (insertion of a tube into your lungs to help you breathe in the event that you get very sick and during the procedure and cannot breathe on your own) and a stay in the intensive care unit.  If the procedure requires anesthesia, your pulmonologist may have to communicate with the surgical team to help provide anesthesia guidelines in order to minimize risk of respiratory complications, this is why I need you to see pulmonary for them to give specific guidelines in case of complication during surgery.

## 2023-10-21 NOTE — Telephone Encounter (Signed)
 Copied from CRM [id number]. Topic: Clinical - Medication Question >> Oct 20, 2023  4:18 PM Jethro Morrison wrote: Reason for CRM: PT SPOUSE TIM ([id number]) IS CALLING STATED HE NEEDS DR DUGAL OR HER NURSE TO CONTACT HIM TODAY BECAUSE HE NEEDS SOME CLARITY ON HER SURGERY. STATED SHE WENT TO GET HER NECK BRACE. STATED SOMEONE CALLED FROM THE CLINIC WANTING HER TO GO TO A HEART DOCTOR WHEN SHE WENT LAST YEAR. HE IS REQUESTING A CALL TODAY.

## 2023-10-21 NOTE — Telephone Encounter (Signed)
 Is it possible to add TSH to lab work it may be too late.Lauren Beck

## 2023-10-21 NOTE — Assessment & Plan Note (Signed)
 Tsh today pending results.  This will not affect surgery, continue with synthroid  take 6 days off day 7

## 2023-10-21 NOTE — Telephone Encounter (Signed)
 LM for pt to return call.   I have contacted [facility] to see when we can get her an appointment.

## 2023-10-21 NOTE — Telephone Encounter (Signed)
 Spoke with pt. She is aware that she needs to be cleared by pulmonary before surgery. Spoke with Sammi Crick at [facility], pt has been scheduled to see Jerlene Moody, NP on 10/22/23 at 1000. Pt is aware of this appointment information. Appointment today with Tabitha will be canceled.

## 2023-10-21 NOTE — Assessment & Plan Note (Signed)
 Smoking cessation instruction/counseling given:  counseled patient on the dangers of tobacco use, advised patient to stop smoking, and reviewed strategies to maximize successdid advise pt how this will increase risk for surgery, pt verbalized understanding.

## 2023-10-22 ENCOUNTER — Encounter
Admission: RE | Admit: 2023-10-22 | Discharge: 2023-10-22 | Disposition: A | Discharge status: Home / Self Care | Source: Ambulatory Visit | Attending: Neurosurgery | Admitting: Neurosurgery

## 2023-10-22 ENCOUNTER — Other Ambulatory Visit: Payer: Self-pay | Admitting: Family

## 2023-10-22 ENCOUNTER — Other Ambulatory Visit: Payer: Self-pay

## 2023-10-22 ENCOUNTER — Encounter: Payer: Self-pay | Admitting: Primary Care

## 2023-10-22 ENCOUNTER — Ambulatory Visit: Admitting: Primary Care

## 2023-10-22 VITALS — BP 100/60 | HR 72 | Temp 97.6°F | Ht 61.0 in | Wt 146.2 lb

## 2023-10-22 VITALS — BP 119/68 | HR 78 | Resp 12

## 2023-10-22 DIAGNOSIS — E039 Hypothyroidism, unspecified: Secondary | ICD-10-CM

## 2023-10-22 DIAGNOSIS — F17210 Nicotine dependence, cigarettes, uncomplicated: Secondary | ICD-10-CM | POA: Diagnosis not present

## 2023-10-22 DIAGNOSIS — J449 Chronic obstructive pulmonary disease, unspecified: Secondary | ICD-10-CM

## 2023-10-22 DIAGNOSIS — Z01812 Encounter for preprocedural laboratory examination: Secondary | ICD-10-CM | POA: Diagnosis present

## 2023-10-22 DIAGNOSIS — Z01818 Encounter for other preprocedural examination: Secondary | ICD-10-CM

## 2023-10-22 HISTORY — DX: Prediabetes: R73.03

## 2023-10-22 HISTORY — DX: Radiculopathy, cervical region: M54.12

## 2023-10-22 HISTORY — DX: Tobacco use: Z72.0

## 2023-10-22 HISTORY — DX: Peripheral vascular disease, unspecified: I73.9

## 2023-10-22 HISTORY — DX: Mixed hyperlipidemia: E78.2

## 2023-10-22 HISTORY — DX: Unspecified inflammatory spondylopathy, cervical region: M46.92

## 2023-10-22 HISTORY — DX: Spinal stenosis, cervical region: M48.02

## 2023-10-22 HISTORY — DX: Personal history of urinary calculi: Z87.442

## 2023-10-22 LAB — TYPE AND SCREEN
ABO/RH(D): O NEG
Antibody Screen: NEGATIVE

## 2023-10-22 LAB — SURGICAL PCR SCREEN
MRSA, PCR: NEGATIVE
Staphylococcus aureus: NEGATIVE

## 2023-10-22 NOTE — Progress Notes (Signed)
 Thyroid  levels are high as can be as expected. Make lab only appt in one month to repeat this level after you have been on the meds again daily.   Please advise pt that pulmonary has cleared pt and I will clear and update the office visit and send over.

## 2023-10-22 NOTE — Progress Notes (Signed)
 @Patient  ID: Lauren Beck, female    DOB: 1960/12/02, 63 y.o.   MRN: [id number]  Chief Complaint  Patient presents with   Follow-up    Surgical assessment-bone spurs x 3 in neck.denies sob, wheezing occass.,cough-dry    Referring provider: [name], FNP  HPI: [id number] with history of AR, COPD, GERD, hypothyroid, smoking referred for COPD  Dyspnea and left upper chest pain (she thinks it is exertional). Albuterol  helps some. She's also got anoro but isn't sure if it's helpful - has been using it for about a month. Last course of prednisone  was last month. She isn't sure if it helped. She does have a cough but it's mostly dry. No BLE swelling, orthopnea.   She has never had a stress test or seen a cardiologist  She has no family history of lung disease  She works in [employer]. She is a crusher. 1ppd 30-40y smoker, currently half ppd. Vaping nicotine . No MJ. She has a dog. She is from [place].   10/22/2023- Interim hx Discussed the use of AI scribe software for clinical note transcription with the patient, who gave verbal consent to proceed.  History of Present Illness   Lauren Beck is a 63 year old female with moderate COPD who presents for surgical clearance for upcoming neck surgery. She was referred by [facility] for surgical clearance.  She is scheduled for neck surgery on May 7th to address three bone spurs and disc issues. The surgery was previously postponed due to insurance requirements related to nicotine  use, despite prior clearance by Lauren Beck.  She has moderate COPD, with a lung function test from May of last year showing 57% predicted lung function, indicating moderate obstructive lung disease. She continues to smoke but is trying to cut down. She uses a Trelegy inhaler daily and carries a rescue inhaler. Her breathing is generally controlled, but she experiences chest tightness when overheated, which improves with cooling down and hydration.  A recent chest x-ray from April  showed no acute cardiopulmonary process. She has not had any recent respiratory infections requiring antibiotics or steroids, nor any hospitalizations. She experienced COVID-19 and norovirus in the past, which she attributes to her husband's occupation as a IT sales professional.  She is preparing for her surgery by trying to settle her house, as she will be in a neck brace postoperatively.      Sinus CT unremarkable, referred to ENT  PFT 11/20/22 with moderately severe obstruction  Has cut back significantly on smoking in anticipation of upcoming neck surgery, down to 5-6 cigarettes/daily.   Allergies  Allergen Reactions   Benadryl [Diphenhydramine Hcl] Other (See Comments)    Makes patient extremely drowsy     Hydrocodone      States has headaches from such   Levaquin  [Levofloxacin ]     arthralgia    Immunization History  Administered Date(s) Administered   Influenza Split 04/24/2011   Pneumococcal Polysaccharide-23 05/11/2008   Td 06/24/1995   Tdap 10/03/2012    Past Medical History:  Diagnosis Date   Allergic rhinitis    Arthritis    Bronchitis    COPD (chronic obstructive pulmonary disease) (HCC)    Excessive or frequent menstruation    GERD (gastroesophageal reflux disease)    History of migraines    Hypothyroidism    Nonspecific abnormal electrocardiogram (ECG) (EKG)    Pure hypercholesterolemia    Tobacco use disorder    Unspecified adjustment reaction     Tobacco History: Social History   Tobacco Use  Smoking  Status Every Day   Current packs/day: 1.50   Average packs/day: 1.5 packs/day for 45.3 years (68.0 ttl pk-yrs)   Types: Cigarettes   Start date: 1980  Smokeless Tobacco Never  Tobacco Comments   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, CMA   Ready to quit: Not Answered Counseling given: Not Answered Tobacco comments: 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, CMA   Outpatient Medications Prior to Visit  Medication Sig Dispense Refill   acetaminophen   (TYLENOL ) 500 MG tablet Take 1,000 mg by mouth every 6 (six) hours as needed for moderate pain (pain score 4-6).     APPLE CIDER VINEGAR PO Take 1 capsule by mouth daily.     atorvastatin  (LIPITOR) 10 MG tablet Take 1 tablet (10 mg total) by mouth daily. 90 tablet 3   bismuth subsalicylate (PEPTO BISMOL) 262 MG/15ML suspension Take 15 mLs by mouth every 6 (six) hours as needed for indigestion or diarrhea or loose stools.     cetirizine  (ZYRTEC ) 10 MG tablet Take 1 tablet (10 mg total) by mouth daily. (Patient taking differently: Take 10 mg by mouth daily as needed for allergies.) 90 tablet 3   fluticasone  (FLONASE ) 50 MCG/ACT nasal spray Place 2 sprays into both nostrils daily. (Patient taking differently: Place 2 sprays into both nostrils daily as needed for allergies.) 16 g 6   Fluticasone -Umeclidin-Vilant (TRELEGY ELLIPTA ) 100-62.5-25 MCG/ACT AEPB Inhale 1 puff into the lungs daily. (Patient taking differently: Inhale 1 puff into the lungs daily as needed (shortness of breath).) 14 each 0   ibuprofen (ADVIL) 200 MG tablet Take 400 mg by mouth every 6 (six) hours as needed for moderate pain (pain score 4-6).     montelukast  (SINGULAIR ) 10 MG tablet Take 10 mg by mouth at bedtime as needed (allergies).     Multiple Vitamin (MULTIVITAMIN WITH MINERALS) TABS tablet Take 1 tablet by mouth daily.     Omeprazole -Sodium Bicarbonate (ZEGERID) 20-1100 MG CAPS capsule Take 1 capsule by mouth daily as needed (heartburn).     SYNTHROID  150 MCG tablet Take one po qd for six days, do not take on day 7 90 tablet 0   No facility-administered medications prior to visit.    Review of Systems  Review of Systems  Constitutional: Negative.   HENT: Negative.    Respiratory:  Positive for shortness of breath. Negative for cough and wheezing.   Cardiovascular: Negative.   Musculoskeletal:  Positive for neck pain.    Physical Exam  BP 100/60 (BP Location: Left Arm, Patient Position: Sitting, Cuff Size: Normal)    Pulse 72   Temp 97.6 F (36.4 C) (Oral)   Ht 5\' 1"  (1.549 m)   Wt 146 lb 3.2 oz (66.3 kg)   LMP 04/06/2012   SpO2 95%   BMI 27.62 kg/m  Physical Exam Constitutional:      General: She is not in acute distress.    Appearance: Normal appearance. She is not ill-appearing.  HENT:     Head: Normocephalic and atraumatic.     Mouth/Throat:     Mouth: Mucous membranes are moist.     Pharynx: Oropharynx is clear.  Cardiovascular:     Rate and Rhythm: Normal rate and regular rhythm.  Pulmonary:     Effort: Pulmonary effort is normal.     Breath sounds: Normal breath sounds. No wheezing, rhonchi or rales.  Skin:    General: Skin is warm and dry.  Neurological:     General: No focal deficit present.  Mental Status: She is alert and oriented to person, place, and time. Mental status is at baseline.  Psychiatric:        Mood and Affect: Mood normal.        Behavior: Behavior normal.        Thought Content: Thought content normal.        Judgment: Judgment normal.      Lab Results:  CBC    Component Value Date/Time   WBC 6.4 10/19/2023 1108   RBC 4.05 10/19/2023 1108   HGB 13.7 10/19/2023 1108   HCT 40.5 10/19/2023 1108   PLT 293.0 10/19/2023 1108   MCV 99.8 10/19/2023 1108   MCH 32.8 02/01/2022 1800   MCHC 34.0 10/19/2023 1108   RDW 13.8 10/19/2023 1108   LYMPHSABS 1.5 02/01/2022 1800   MONOABS 1.0 02/01/2022 1800   EOSABS 0.0 02/01/2022 1800   BASOSABS 0.0 02/01/2022 1800    BMET    Component Value Date/Time   NA 138 10/19/2023 1108   K 4.2 10/19/2023 1108   CL 102 10/19/2023 1108   CO2 28 10/19/2023 1108   GLUCOSE 82 10/19/2023 1108   BUN 21 10/19/2023 1108   CREATININE 1.02 10/19/2023 1108   CALCIUM  9.6 10/19/2023 1108   GFRNONAA >60 02/01/2022 1800   GFRAA 75 (L) 05/05/2014 0850    BNP    Component Value Date/Time   BNP 43.1 02/01/2022 1801    ProBNP No results found for: "PROBNP"  Imaging: DG Chest 2 View Result Date: 10/19/2023 CLINICAL  DATA:  copd pre op clearance EXAM: CHEST - 2 VIEW COMPARISON:  November 28, 2022 FINDINGS: Biapical pleuroparenchymal scarring. Chronic coarsening of the pulmonary interstitium, likely due to underlying emphysema. No focal airspace consolidation, pleural effusion, or pneumothorax. No cardiomegaly. Tortuous aorta with aortic atherosclerosis. No acute fracture or destructive lesions. Multilevel thoracic osteophytosis. IMPRESSION: Emphysema.  No acute cardiopulmonary abnormality. Electronically Signed   By: Rance Burrows M.D.   On: 10/19/2023 12:02     Assessment & Plan:   1. COPD, group B, by GOLD 2017 classification (HCC) (Primary) \  Assessment and Plan    Moderate COPD Moderate COPD, stage 2, FEV1 1.3l (57% predicted). No acute respiratory complaints, recent respiratory infections or hospitalizations. Chest x-ray on April 28th showed emphysema without acute cardiopulmonary process.  Using Trelegy inhaler daily. Low to moderate risk for perioperative pulmonary complications for upcoming neck surgery. Previous clearance by Dr. Irven Manson indicated low risk for surgery, and current assessment supports proceeding with surgery from a pulmonary standpoint. - Encourage early ambulation post-surgery to prevent respiratory complications and thromboembolism. - Advise sitting up in a chair for meals and light ambulation two to three times daily post-surgery. - Ensure rescue inhaler is readily available. - Use incentive spirometry postoperatively.  Active smoking status Active smoker with efforts to reduce smoking. Smoking cessation is encouraged, especially in light of upcoming surgery. Previous attempts to quit using nicotine  replacement therapy were unsuccessful due to nicotine  content. Current surgical team is aware of smoking status and has agreed to proceed with surgery using an anterior approach. - Encourage continued efforts to reduce smoking.   1) RISK FOR PROLONGED MECHANICAL VENTILAION - > 48h  1A)  Arozullah - Prolonged mech ventilation risk Arozullah Postperative Pulmonary Risk Score - for mech ventilation dependence >48h USAA, Ann Surg 2000, major non-cardiac surgery) Comment Score  Type of surgery - abd ao aneurysm (27), thoracic (21), neurosurgery / upper abdominal / vascular (21), neck (11) Neck  11  Emergency Surgery - (11)  0  ALbumin < 3 or poor nutritional state - (9)  0  BUN > 30 -  (8)  0  Partial or completely dependent functional status - (7)  0  COPD -  (6)  6  Age - 60 to 67 (4), > 70  (6)  4  TOTAL  22  Risk Stratifcation scores  - < 10 (0.5%), 11-19 (1.8%), 20-27 (4.2%), 28-40 (10.1%), >40 (26.6%)  4.2% risk prolonged mech ventilation      1B) GUPTA - Prolonged Mech Vent Risk Score source Risk  Guptal post op prolonged mech ventilation > 48h or reintubation < 30 days - ACS 2007-2008 dataset - [email or web address] 0.4 % Risk of mechanical ventilation for >48 hrs after surgery, or unplanned intubation <=30 days of surgery    2) RISK FOR POST OP PNEUMONIA Score source Risk  Venice Gillis - Post Op Pnemounia risk  [email or web address] 0.5 % Risk of postoperative pneumonia     R3) ISK FOR ANY POST-OP PULMONARY COMPLICATION Score source Risk  CANET/ARISCAT Score - risk for ANY/ALl pulmonary complications - > risk of in-hospital post-op pulmonary complications (composite including respiratory failure, respiratory infection, pleural effusion, atelectasis, pneumothorax, bronchospasm, aspiration pneumonitis) [employer].es - based on age, anemia, pulse ox, resp infection prior 30d, incision site, duration of surgery, and emergency v elective surgery Intermediate risk 13.3% risk of in-hospital post-op pulmonary complications (composite including respiratory failure, respiratory infection, pleural effusion, atelectasis,  pneumothorax, bronchospasm, aspiration pneumonitis)     Antonio Baumgarten, NP 10/22/2023

## 2023-10-22 NOTE — Patient Instructions (Addendum)
  To reduce risks of respiratory complications, I recommend: --Smoking cessation 6-8 weeks if possible if patient is an active smoker --Pre- and post-operative incentive spirometry performed frequently while awake --Avoiding use of pancuronium during anesthesia. --Early implementation of DVT chemoppx when acceptable from surgical wound healing standpoint --Encourage mobility and incentive spirometry post-op  Send surgical clearance to Dr. Mont Antis [address] Phone [phone]    VISIT SUMMARY: You came in today for surgical clearance for your upcoming neck surgery. We discussed your moderate COPD and your current smoking status. Your lung function is stable, and you are considered low to moderate risk for surgery from a pulmonary standpoint. We also talked about ways to manage your COPD symptoms and the importance of reducing smoking, especially with your surgery coming up.  YOUR PLAN: -MODERATE COPD: Moderate COPD means you have a lung condition that makes it hard to breathe, and your lung function is at 57% of what is expected. You should continue using your Trelegy inhaler daily and keep your rescue inhaler handy. After surgery, it's important to get up and move around early to prevent complications. Sit up in a chair for meals and walk around two to three times a day. Use an incentive spirometer to help keep your lungs clear.  -ACTIVE SMOKING STATUS: You are currently smoking but trying to cut down. Smoking can make your recovery from surgery harder, so it's important to keep trying to reduce or quit smoking. Your surgical team is aware of your smoking status and will proceed with the surgery using an anterior approach.  INSTRUCTIONS: Please make sure to follow the advice given for managing your COPD and reducing smoking. After your surgery, remember to get up and move around as soon as you can, sit up for meals, and use your incentive spirometer. Keep your rescue  inhaler with you at all times. Your surgery is scheduled for May 7th.   Follow-up Please schedule patient with Dr. Diania Fortes- new patient 6 months

## 2023-10-22 NOTE — Patient Instructions (Addendum)
 Your procedure is scheduled on:10-28-23 Wednesday Report to the Registration Desk on the 1st floor of the [facility].Then proceed to the 2nd floor Surgery Desk To find out your arrival time, please call [phone] between 1PM - 3PM on:10-27-23 Tuesday If your arrival time is 6:00 am, do not arrive before that time as the [facility] entrance doors do not open until 6:00 am.  REMEMBER: Instructions that are not followed completely may result in serious medical risk, up to and including death; or upon the discretion of your surgeon and anesthesiologist your surgery may need to be rescheduled.  Do not eat food after midnight the night before surgery.  No gum chewing or hard candies.  You may however, drink CLEAR liquids up to 2 hours before you are scheduled to arrive for your surgery. Do not drink anything within 2 hours of your scheduled arrival time.  Clear liquids include: - water  - apple juice without pulp - gatorade (not RED colors) - black coffee or tea (Do NOT add milk or creamers to the coffee or tea) Do NOT drink anything that is not on this list.  One week prior to surgery:Stop NOW (10-22-23) Stop ANY OVER THE COUNTER supplements until after surgery (Apple Cider Vinegar, Multivitamin)  Continue taking all of your other prescription medications up until the day of surgery.  Do NOT take any medication the day of surgery  Use your Fluticasone -Umeclidin-Vilant (TRELEGY ELLIPTA ) Inhaler the day of surgery  No Alcohol for 24 hours before or after surgery.  No Smoking including e-cigarettes for 24 hours before surgery.  No chewable tobacco products for at least 6 hours before surgery.  No nicotine  patches on the day of surgery.  Do not use any "recreational" drugs for at least a week (preferably 2 weeks) before your surgery.  Please be advised that the combination of cocaine and anesthesia may have negative outcomes, up to and including death. If you test positive for cocaine,  your surgery will be cancelled.  On the morning of surgery brush your teeth with toothpaste and water, you may rinse your mouth with mouthwash if you wish. Do not swallow any toothpaste or mouthwash.  Use CHG Soap as directed on instruction sheet.  Do not wear jewelry, make-up, hairpins, clips or nail polish.  For welded (permanent) jewelry: bracelets, anklets, waist bands, etc.  Please have this removed prior to surgery.  If it is not removed, there is a chance that hospital personnel will need to cut it off on the day of surgery.  Do not wear lotions, powders, or perfumes.   Do not shave body hair from the neck down 48 hours before surgery.  Contact lenses, hearing aids and dentures may not be worn into surgery.  Do not bring valuables to the hospital. [facility] is not responsible for any missing/lost belongings or valuables.   Notify your doctor if there is any change in your medical condition (cold, fever, infection).  Wear comfortable clothing (specific to your surgery type) to the hospital.  After surgery, you can help prevent lung complications by doing breathing exercises.  Take deep breaths and cough every 1-2 hours. Your doctor may order a device called an Incentive Spirometer to help you take deep breaths. When coughing or sneezing, hold a pillow firmly against your incision with both hands. This is called "splinting." Doing this helps protect your incision. It also decreases belly discomfort.  If you are being admitted to the hospital overnight, leave your suitcase in  the car. After surgery it may be brought to your room.  In case of increased patient census, it may be necessary for you, the patient, to continue your postoperative care in the Same Day Surgery department.  If you are being discharged the day of surgery, you will not be allowed to drive home. You will need a responsible individual to drive you home and stay with you for 24 hours after surgery.   If you  are taking public transportation, you will need to have a responsible individual with you.  Please call the Pre-admissions Testing Dept. at [phone] if you have any questions about these instructions.  Surgery Visitation Policy:  Patients having surgery or a procedure may have two visitors.  Children under the age of 63 must have an adult with them who is not the patient.  Inpatient Visitation:    Visiting hours are 7 a.m. to 8 p.m. Up to four visitors are allowed at one time in a patient room. The visitors may rotate out with other people during the day.  One visitor age 62 or older may stay with the patient overnight and must be in the room by 8 p.m.    Pre-operative 5 CHG Bath Instructions   You can play a key role in reducing the risk of infection after surgery. Your skin needs to be as free of germs as possible. You can reduce the number of germs on your skin by washing with CHG (chlorhexidine gluconate) soap before surgery. CHG is an antiseptic soap that kills germs and continues to kill germs even after washing.   DO NOT use if you have an allergy to chlorhexidine/CHG or antibacterial soaps. If your skin becomes reddened or irritated, stop using the CHG and notify one of our RNs at [phone].   Please shower with the CHG soap starting 4 days before surgery using the following schedule:     Please keep in mind the following:  DO NOT shave, including legs and underarms, starting the day of your first shower.   You may shave your face at any point before/day of surgery.  Place clean sheets on your bed the day you start using CHG soap. Use a clean washcloth (not used since being washed) for each shower. DO NOT sleep with pets once you start using the CHG.   CHG Shower Instructions:  If you choose to wash your hair and private area, wash first with your normal shampoo/soap.  After you use shampoo/soap, rinse your hair and body thoroughly to remove shampoo/soap residue.   Turn the water OFF and apply about 3 tablespoons (45 ml) of CHG soap to a CLEAN washcloth.  Apply CHG soap ONLY FROM YOUR NECK DOWN TO YOUR TOES (washing for 3-5 minutes)  DO NOT use CHG soap on face, private areas, open wounds, or sores.  Pay special attention to the area where your surgery is being performed.  If you are having back surgery, having someone wash your back for you may be helpful. Wait 2 minutes after CHG soap is applied, then you may rinse off the CHG soap.  Pat dry with a clean towel  Put on clean clothes/pajamas   If you choose to wear lotion, please use ONLY the CHG-compatible lotions on the back of this paper.     Additional instructions for the day of surgery: DO NOT APPLY any lotions, deodorants, cologne, or perfumes.   Put on clean/comfortable clothes.  Brush your teeth.  Ask your nurse before  applying any prescription medications to the skin.      CHG Compatible Lotions   Aveeno Moisturizing lotion  Cetaphil Moisturizing Cream  Cetaphil Moisturizing Lotion  Clairol Herbal Essence Moisturizing Lotion, Dry Skin  Clairol Herbal Essence Moisturizing Lotion, Extra Dry Skin  Clairol Herbal Essence Moisturizing Lotion, Normal Skin  Curel Age Defying Therapeutic Moisturizing Lotion with Alpha Hydroxy  Curel Extreme Care Body Lotion  Curel Soothing Hands Moisturizing Hand Lotion  Curel Therapeutic Moisturizing Cream, Fragrance-Free  Curel Therapeutic Moisturizing Lotion, Fragrance-Free  Curel Therapeutic Moisturizing Lotion, Original Formula  Eucerin Daily Replenishing Lotion  Eucerin Dry Skin Therapy Plus Alpha Hydroxy Crme  Eucerin Dry Skin Therapy Plus Alpha Hydroxy Lotion  Eucerin Original Crme  Eucerin Original Lotion  Eucerin Plus Crme Eucerin Plus Lotion  Eucerin TriLipid Replenishing Lotion  Keri Anti-Bacterial Hand Lotion  Keri Deep Conditioning Original Lotion Dry Skin Formula Softly Scented  Keri Deep Conditioning Original Lotion, Fragrance  Free Sensitive Skin Formula  Keri Lotion Fast Absorbing Fragrance Free Sensitive Skin Formula  Keri Lotion Fast Absorbing Softly Scented Dry Skin Formula  Keri Original Lotion  Keri Skin Renewal Lotion Keri Silky Smooth Lotion  Keri Silky Smooth Sensitive Skin Lotion  Nivea Body Creamy Conditioning Oil  Nivea Body Extra Enriched Teacher, adult education Moisturizing Lotion Nivea Crme  Nivea Skin Firming Lotion  NutraDerm 30 Skin Lotion  NutraDerm Skin Lotion  NutraDerm Therapeutic Skin Cream  NutraDerm Therapeutic Skin Lotion  ProShield Protective Hand Cream  Provon moisturizing lotion

## 2023-10-22 NOTE — Telephone Encounter (Signed)
 Office visit note sent to Renate Caroline NP.  Faxed surgeries clearance to Lanice Pita NP to send back to office.

## 2023-10-23 NOTE — Telephone Encounter (Signed)
 Clearance received fromPCP

## 2023-10-28 ENCOUNTER — Other Ambulatory Visit: Payer: Self-pay

## 2023-10-28 ENCOUNTER — Encounter: Payer: Self-pay | Admitting: Neurosurgery

## 2023-10-28 ENCOUNTER — Inpatient Hospital Stay: Payer: Self-pay | Admitting: Urgent Care

## 2023-10-28 ENCOUNTER — Inpatient Hospital Stay

## 2023-10-28 ENCOUNTER — Inpatient Hospital Stay
Admission: RE | Admit: 2023-10-28 | Discharge: 2023-10-29 | DRG: 473 | Disposition: A | Discharge status: Home / Self Care | Attending: Neurosurgery | Admitting: Neurosurgery

## 2023-10-28 ENCOUNTER — Encounter
Admission: RE | Disposition: A | Discharge status: Home / Self Care | Payer: Self-pay | Source: Home / Self Care | Attending: Neurosurgery

## 2023-10-28 DIAGNOSIS — M25512 Pain in left shoulder: Secondary | ICD-10-CM | POA: Diagnosis present

## 2023-10-28 DIAGNOSIS — Z888 Allergy status to other drugs, medicaments and biological substances status: Secondary | ICD-10-CM

## 2023-10-28 DIAGNOSIS — J449 Chronic obstructive pulmonary disease, unspecified: Secondary | ICD-10-CM | POA: Diagnosis present

## 2023-10-28 DIAGNOSIS — Z981 Arthrodesis status: Principal | ICD-10-CM

## 2023-10-28 DIAGNOSIS — R29898 Other symptoms and signs involving the musculoskeletal system: Secondary | ICD-10-CM | POA: Diagnosis not present

## 2023-10-28 DIAGNOSIS — J309 Allergic rhinitis, unspecified: Secondary | ICD-10-CM | POA: Diagnosis present

## 2023-10-28 DIAGNOSIS — Z881 Allergy status to other antibiotic agents status: Secondary | ICD-10-CM

## 2023-10-28 DIAGNOSIS — M5412 Radiculopathy, cervical region: Secondary | ICD-10-CM | POA: Diagnosis present

## 2023-10-28 DIAGNOSIS — F17210 Nicotine dependence, cigarettes, uncomplicated: Secondary | ICD-10-CM | POA: Diagnosis present

## 2023-10-28 DIAGNOSIS — Z79890 Hormone replacement therapy (postmenopausal): Secondary | ICD-10-CM

## 2023-10-28 DIAGNOSIS — E039 Hypothyroidism, unspecified: Secondary | ICD-10-CM | POA: Diagnosis present

## 2023-10-28 DIAGNOSIS — M4802 Spinal stenosis, cervical region: Principal | ICD-10-CM

## 2023-10-28 DIAGNOSIS — Z79899 Other long term (current) drug therapy: Secondary | ICD-10-CM | POA: Diagnosis not present

## 2023-10-28 DIAGNOSIS — Z885 Allergy status to narcotic agent status: Secondary | ICD-10-CM | POA: Diagnosis not present

## 2023-10-28 DIAGNOSIS — E7800 Pure hypercholesterolemia, unspecified: Secondary | ICD-10-CM | POA: Diagnosis present

## 2023-10-28 DIAGNOSIS — M2578 Osteophyte, vertebrae: Secondary | ICD-10-CM | POA: Diagnosis present

## 2023-10-28 DIAGNOSIS — K219 Gastro-esophageal reflux disease without esophagitis: Secondary | ICD-10-CM | POA: Diagnosis present

## 2023-10-28 DIAGNOSIS — G8929 Other chronic pain: Secondary | ICD-10-CM

## 2023-10-28 HISTORY — PX: ANTERIOR CERVICAL DECOMP/DISCECTOMY FUSION: SHX1161

## 2023-10-28 LAB — ABO/RH: ABO/RH(D): O NEG

## 2023-10-28 SURGERY — ANTERIOR CERVICAL DECOMPRESSION/DISCECTOMY FUSION 3 LEVELS
Anesthesia: General | Site: Spine Cervical

## 2023-10-28 MED ORDER — LACTATED RINGERS IV SOLN: INTRAVENOUS | Status: DC

## 2023-10-28 MED ORDER — CEFAZOLIN SODIUM-DEXTROSE 2-4 GM/100ML-% IV SOLN
2.0000 g | INTRAVENOUS | Status: AC
  Administered 2023-10-28: 2 g via INTRAVENOUS

## 2023-10-28 MED ORDER — ACETAMINOPHEN 500 MG PO TABS
1000.0000 mg | ORAL_TABLET | Freq: Four times a day (QID) | ORAL | Status: DC
  Administered 2023-10-28 – 2023-10-29 (×4): 1000 mg via ORAL
  Filled 2023-10-28 (×3): qty 2

## 2023-10-28 MED ORDER — METHOCARBAMOL 500 MG PO TABS
500.0000 mg | ORAL_TABLET | Freq: Four times a day (QID) | ORAL | Status: DC | PRN
  Administered 2023-10-28: 500 mg via ORAL

## 2023-10-28 MED ORDER — SURGIFLO WITH THROMBIN (HEMOSTATIC MATRIX KIT) OPTIME
TOPICAL | Status: DC | PRN
  Administered 2023-10-28: 1 via TOPICAL

## 2023-10-28 MED ORDER — OXYCODONE HCL 5 MG PO TABS
10.0000 mg | ORAL_TABLET | ORAL | Status: DC | PRN
  Administered 2023-10-28: 10 mg via ORAL
  Filled 2023-10-28: qty 2

## 2023-10-28 MED ORDER — PHENYLEPHRINE HCL-NACL 20-0.9 MG/250ML-% IV SOLN
INTRAVENOUS | Status: DC | PRN
Start: 2023-10-28 — End: 2023-10-28
  Administered 2023-10-28: 40 ug/min via INTRAVENOUS

## 2023-10-28 MED ORDER — OXYCODONE HCL 5 MG PO TABS
ORAL_TABLET | ORAL | Status: AC
  Filled 2023-10-28: qty 1

## 2023-10-28 MED ORDER — REMIFENTANIL HCL 1 MG IV SOLR
INTRAVENOUS | Status: DC | PRN
  Administered 2023-10-28: .1 ug/kg/min via INTRAVENOUS

## 2023-10-28 MED ORDER — MIDAZOLAM HCL 2 MG/2ML IJ SOLN
INTRAMUSCULAR | Status: AC
Start: 2023-10-28 — End: ?
  Filled 2023-10-28: qty 2

## 2023-10-28 MED ORDER — BUDESON-GLYCOPYRROL-FORMOTEROL 160-9-4.8 MCG/ACT IN AERO
2.0000 | INHALATION_SPRAY | Freq: Two times a day (BID) | RESPIRATORY_TRACT | Status: DC
  Administered 2023-10-28 – 2023-10-29 (×2): 2 via RESPIRATORY_TRACT
  Filled 2023-10-28: qty 5.9

## 2023-10-28 MED ORDER — ONDANSETRON HCL 4 MG/2ML IJ SOLN: 4.0000 mg | Freq: Four times a day (QID) | INTRAMUSCULAR | Status: DC | PRN

## 2023-10-28 MED ORDER — SODIUM CHLORIDE 0.9% FLUSH: 3.0000 mL | INTRAVENOUS | Status: DC | PRN

## 2023-10-28 MED ORDER — PHENOL 1.4 % MT LIQD: 1.0000 | OROMUCOSAL | Status: DC | PRN

## 2023-10-28 MED ORDER — ATORVASTATIN CALCIUM 20 MG PO TABS
10.0000 mg | ORAL_TABLET | Freq: Every day | ORAL | Status: DC
  Administered 2023-10-28: 10 mg via ORAL
  Filled 2023-10-28: qty 1

## 2023-10-28 MED ORDER — PROPOFOL 10 MG/ML IV BOLUS
INTRAVENOUS | Status: AC
  Filled 2023-10-28: qty 20

## 2023-10-28 MED ORDER — KETOROLAC TROMETHAMINE 30 MG/ML IJ SOLN
INTRAMUSCULAR | Status: DC | PRN
  Administered 2023-10-28: 30 mg via INTRAVENOUS

## 2023-10-28 MED ORDER — FENTANYL CITRATE (PF) 100 MCG/2ML IJ SOLN
25.0000 ug | INTRAMUSCULAR | Status: DC | PRN
  Administered 2023-10-28 (×4): 25 ug via INTRAVENOUS

## 2023-10-28 MED ORDER — ACETAMINOPHEN 325 MG PO TABS: 650.0000 mg | ORAL_TABLET | ORAL | Status: DC | PRN

## 2023-10-28 MED ORDER — DOCUSATE SODIUM 100 MG PO CAPS
100.0000 mg | ORAL_CAPSULE | Freq: Two times a day (BID) | ORAL | Status: DC
  Administered 2023-10-28 – 2023-10-29 (×2): 100 mg via ORAL
  Filled 2023-10-28 (×2): qty 1

## 2023-10-28 MED ORDER — MIDAZOLAM HCL 2 MG/2ML IJ SOLN
INTRAMUSCULAR | Status: DC | PRN
  Administered 2023-10-28: 2 mg via INTRAVENOUS

## 2023-10-28 MED ORDER — FENTANYL CITRATE (PF) 100 MCG/2ML IJ SOLN
INTRAMUSCULAR | Status: AC
  Filled 2023-10-28: qty 2

## 2023-10-28 MED ORDER — MONTELUKAST SODIUM 10 MG PO TABS
10.0000 mg | ORAL_TABLET | Freq: Every evening | ORAL | Status: DC | PRN
Start: 2023-10-28 — End: 2023-10-29

## 2023-10-28 MED ORDER — LORATADINE 10 MG PO TABS
10.0000 mg | ORAL_TABLET | Freq: Every day | ORAL | Status: DC
  Administered 2023-10-28 – 2023-10-29 (×2): 10 mg via ORAL
  Filled 2023-10-28 (×2): qty 1

## 2023-10-28 MED ORDER — PROPOFOL 10 MG/ML IV BOLUS
INTRAVENOUS | Status: DC | PRN
  Administered 2023-10-28: 90 mg via INTRAVENOUS

## 2023-10-28 MED ORDER — SENNA 8.6 MG PO TABS
1.0000 | ORAL_TABLET | Freq: Two times a day (BID) | ORAL | Status: DC
  Administered 2023-10-28 – 2023-10-29 (×2): 8.6 mg via ORAL
  Filled 2023-10-28 (×2): qty 1

## 2023-10-28 MED ORDER — EPHEDRINE SULFATE-NACL 50-0.9 MG/10ML-% IV SOSY
PREFILLED_SYRINGE | INTRAVENOUS | Status: DC | PRN
  Administered 2023-10-28 (×2): 5 mg via INTRAVENOUS

## 2023-10-28 MED ORDER — FLUTICASONE PROPIONATE 50 MCG/ACT NA SUSP: 2.0000 | Freq: Every day | NASAL | Status: DC | PRN

## 2023-10-28 MED ORDER — OXYCODONE HCL 5 MG PO TABS
5.0000 mg | ORAL_TABLET | Freq: Once | ORAL | Status: AC | PRN
  Administered 2023-10-28: 5 mg via ORAL

## 2023-10-28 MED ORDER — BISMUTH SUBSALICYLATE 262 MG/15ML PO SUSP
15.0000 mL | Freq: Four times a day (QID) | ORAL | Status: DC | PRN
  Administered 2023-10-29: 15 mL via ORAL
  Filled 2023-10-28: qty 118

## 2023-10-28 MED ORDER — PANTOPRAZOLE SODIUM 40 MG PO TBEC
40.0000 mg | DELAYED_RELEASE_TABLET | Freq: Every day | ORAL | Status: DC
  Administered 2023-10-28 – 2023-10-29 (×2): 40 mg via ORAL
  Filled 2023-10-28 (×2): qty 1

## 2023-10-28 MED ORDER — ENOXAPARIN SODIUM 40 MG/0.4ML IJ SOSY
40.0000 mg | PREFILLED_SYRINGE | INTRAMUSCULAR | Status: DC
  Administered 2023-10-29: 40 mg via SUBCUTANEOUS
  Filled 2023-10-28: qty 0.4

## 2023-10-28 MED ORDER — ORAL CARE MOUTH RINSE: 15.0000 mL | Freq: Once | OROMUCOSAL | Status: AC

## 2023-10-28 MED ORDER — KETOROLAC TROMETHAMINE 15 MG/ML IJ SOLN
INTRAMUSCULAR | Status: AC
  Filled 2023-10-28: qty 1

## 2023-10-28 MED ORDER — SODIUM CHLORIDE 0.9% FLUSH
3.0000 mL | Freq: Two times a day (BID) | INTRAVENOUS | Status: DC
  Administered 2023-10-28 – 2023-10-29 (×2): 3 mL via INTRAVENOUS

## 2023-10-28 MED ORDER — GLYCOPYRROLATE 0.2 MG/ML IJ SOLN
INTRAMUSCULAR | Status: DC | PRN
  Administered 2023-10-28 (×2): .2 mg via INTRAVENOUS

## 2023-10-28 MED ORDER — POLYETHYLENE GLYCOL 3350 17 G PO PACK: 17.0000 g | PACK | Freq: Every day | ORAL | Status: DC | PRN

## 2023-10-28 MED ORDER — ONDANSETRON HCL 4 MG/2ML IJ SOLN
INTRAMUSCULAR | Status: DC | PRN
  Administered 2023-10-28: 4 mg via INTRAVENOUS

## 2023-10-28 MED ORDER — SUCCINYLCHOLINE CHLORIDE 200 MG/10ML IV SOSY
PREFILLED_SYRINGE | INTRAVENOUS | Status: DC | PRN
  Administered 2023-10-28: 100 mg via INTRAVENOUS

## 2023-10-28 MED ORDER — PHENYLEPHRINE HCL-NACL 20-0.9 MG/250ML-% IV SOLN
INTRAVENOUS | Status: AC
  Filled 2023-10-28: qty 250

## 2023-10-28 MED ORDER — ACETAMINOPHEN 10 MG/ML IV SOLN
INTRAVENOUS | Status: DC | PRN
  Administered 2023-10-28: 1000 mg via INTRAVENOUS

## 2023-10-28 MED ORDER — ACETAMINOPHEN 10 MG/ML IV SOLN
INTRAVENOUS | Status: AC
  Filled 2023-10-28: qty 100

## 2023-10-28 MED ORDER — METHOCARBAMOL 1000 MG/10ML IJ SOLN: 500.0000 mg | Freq: Four times a day (QID) | INTRAMUSCULAR | Status: DC | PRN

## 2023-10-28 MED ORDER — ACETAMINOPHEN 650 MG RE SUPP: 650.0000 mg | RECTAL | Status: DC | PRN

## 2023-10-28 MED ORDER — CHLORHEXIDINE GLUCONATE 0.12 % MT SOLN
15.0000 mL | Freq: Once | OROMUCOSAL | Status: AC
  Administered 2023-10-28: 15 mL via OROMUCOSAL

## 2023-10-28 MED ORDER — DEXAMETHASONE SODIUM PHOSPHATE 10 MG/ML IJ SOLN
INTRAMUSCULAR | Status: DC | PRN
  Administered 2023-10-28: 10 mg via INTRAVENOUS

## 2023-10-28 MED ORDER — LEVOTHYROXINE SODIUM 150 MCG PO TABS
150.0000 ug | ORAL_TABLET | Freq: Every day | ORAL | Status: DC
  Administered 2023-10-29: 150 ug via ORAL
  Filled 2023-10-28: qty 3
  Filled 2023-10-28: qty 1

## 2023-10-28 MED ORDER — METHOCARBAMOL 500 MG PO TABS
ORAL_TABLET | ORAL | Status: AC
Start: 2023-10-28 — End: ?
  Filled 2023-10-28: qty 1

## 2023-10-28 MED ORDER — REMIFENTANIL HCL 1 MG IV SOLR
INTRAVENOUS | Status: AC
  Filled 2023-10-28: qty 1000

## 2023-10-28 MED ORDER — MAGNESIUM CITRATE PO SOLN: 1.0000 | Freq: Once | ORAL | Status: DC | PRN

## 2023-10-28 MED ORDER — CEFAZOLIN IN SODIUM CHLORIDE 2-0.9 GM/100ML-% IV SOLN
2.0000 g | Freq: Once | INTRAVENOUS | Status: DC
  Filled 2023-10-28: qty 100

## 2023-10-28 MED ORDER — FENTANYL CITRATE (PF) 100 MCG/2ML IJ SOLN
INTRAMUSCULAR | Status: DC | PRN
  Administered 2023-10-28: 50 ug via INTRAVENOUS

## 2023-10-28 MED ORDER — MENTHOL 3 MG MT LOZG: 1.0000 | LOZENGE | OROMUCOSAL | Status: DC | PRN

## 2023-10-28 MED ORDER — SODIUM CHLORIDE 0.9 % IV SOLN: 250.0000 mL | INTRAVENOUS | Status: DC

## 2023-10-28 MED ORDER — LIDOCAINE HCL (CARDIAC) PF 100 MG/5ML IV SOSY
PREFILLED_SYRINGE | INTRAVENOUS | Status: DC | PRN
  Administered 2023-10-28: 60 mg via INTRAVENOUS

## 2023-10-28 MED ORDER — DEXMEDETOMIDINE HCL IN NACL 80 MCG/20ML IV SOLN
INTRAVENOUS | Status: DC | PRN
  Administered 2023-10-28: 8 ug via INTRAVENOUS

## 2023-10-28 MED ORDER — CHLORHEXIDINE GLUCONATE 0.12 % MT SOLN
OROMUCOSAL | Status: AC
  Filled 2023-10-28: qty 15

## 2023-10-28 MED ORDER — KETAMINE HCL 50 MG/5ML IJ SOSY
PREFILLED_SYRINGE | INTRAMUSCULAR | Status: DC | PRN
  Administered 2023-10-28: 25 mg via INTRAVENOUS

## 2023-10-28 MED ORDER — VASOPRESSIN 20 UNIT/ML IV SOLN
INTRAVENOUS | Status: DC | PRN
  Administered 2023-10-28: 1 [IU] via INTRAVENOUS
  Administered 2023-10-28 (×4): 2 [IU] via INTRAVENOUS

## 2023-10-28 MED ORDER — CEFAZOLIN SODIUM-DEXTROSE 2-4 GM/100ML-% IV SOLN
INTRAVENOUS | Status: AC
  Filled 2023-10-28: qty 100

## 2023-10-28 MED ORDER — SORBITOL 70 % SOLN: 30.0000 mL | Freq: Every day | Status: DC | PRN

## 2023-10-28 MED ORDER — PHENYLEPHRINE 80 MCG/ML (10ML) SYRINGE FOR IV PUSH (FOR BLOOD PRESSURE SUPPORT)
PREFILLED_SYRINGE | INTRAVENOUS | Status: DC | PRN
  Administered 2023-10-28: 160 ug via INTRAVENOUS

## 2023-10-28 MED ORDER — KETAMINE HCL 50 MG/5ML IJ SOSY
PREFILLED_SYRINGE | INTRAMUSCULAR | Status: AC
  Filled 2023-10-28: qty 5

## 2023-10-28 MED ORDER — OXYCODONE HCL 5 MG/5ML PO SOLN: 5.0000 mg | Freq: Once | ORAL | Status: AC | PRN

## 2023-10-28 MED ORDER — KETOROLAC TROMETHAMINE 15 MG/ML IJ SOLN
7.5000 mg | Freq: Four times a day (QID) | INTRAMUSCULAR | Status: DC
  Administered 2023-10-28 – 2023-10-29 (×3): 7.5 mg via INTRAVENOUS
  Filled 2023-10-28 (×2): qty 1

## 2023-10-28 MED ORDER — ONDANSETRON HCL 4 MG PO TABS: 4.0000 mg | ORAL_TABLET | Freq: Four times a day (QID) | ORAL | Status: DC | PRN

## 2023-10-28 MED ORDER — OXYCODONE HCL 5 MG PO TABS: 5.0000 mg | ORAL_TABLET | ORAL | Status: DC | PRN

## 2023-10-28 MED ORDER — 0.9 % SODIUM CHLORIDE (POUR BTL) OPTIME
TOPICAL | Status: DC | PRN
  Administered 2023-10-28: 500 mL

## 2023-10-28 MED ORDER — BUPIVACAINE-EPINEPHRINE (PF) 0.5% -1:200000 IJ SOLN
INTRAMUSCULAR | Status: DC | PRN
  Administered 2023-10-28: 3 mL via PERINEURAL

## 2023-10-28 SURGICAL SUPPLY — 46 items
ALLOGRAFT BONE FIBER KORE 1CC (Bone Implant) IMPLANT
BASIN KIT SINGLE STR (MISCELLANEOUS) ×2 IMPLANT
BUR NEURO DRILL SOFT 3.0X3.8M (BURR) ×2 IMPLANT
DERMABOND ADVANCED .7 DNX12 (GAUZE/BANDAGES/DRESSINGS) ×2 IMPLANT
DRAIN CHANNEL JP 10F RND 20C F (MISCELLANEOUS) IMPLANT
DRAPE C ARM PK CFD 31 SPINE (DRAPES) ×2 IMPLANT
DRAPE C-ARM XRAY 36X54 (DRAPES) IMPLANT
DRAPE LAPAROTOMY 77X122 PED (DRAPES) ×2 IMPLANT
DRAPE MICROSCOPE SPINE 48X150 (DRAPES) ×2 IMPLANT
DRSG TEGADERM 4X4.75 (GAUZE/BANDAGES/DRESSINGS) IMPLANT
ELECTRODE REM PT RTRN 9FT ADLT (ELECTROSURGICAL) ×2 IMPLANT
EVACUATOR 1/8 PVC DRAIN (DRAIN) IMPLANT
EVACUATOR SILICONE 100CC (DRAIN) IMPLANT
FEE INTRAOP CADWELL SUPPLY NCS (MISCELLANEOUS) IMPLANT
FEE INTRAOP MONITOR IMPULS NCS (MISCELLANEOUS) IMPLANT
GAUZE SPONGE 2X2 STRL 8-PLY (GAUZE/BANDAGES/DRESSINGS) IMPLANT
GLOVE BIOGEL PI IND STRL 6.5 (GLOVE) ×2 IMPLANT
GLOVE SURG SYN 6.5 ES PF (GLOVE) ×1 IMPLANT
GLOVE SURG SYN 6.5 PF PI (GLOVE) ×2 IMPLANT
GLOVE SURG SYN 8.5 E (GLOVE) ×3 IMPLANT
GLOVE SURG SYN 8.5 PF PI (GLOVE) ×6 IMPLANT
GOWN SRG LRG LVL 4 IMPRV REINF (GOWNS) ×2 IMPLANT
GOWN SRG XL LVL 3 NONREINFORCE (GOWNS) ×2 IMPLANT
KIT TURNOVER KIT A (KITS) ×2 IMPLANT
MANIFOLD NEPTUNE II (INSTRUMENTS) ×2 IMPLANT
NDL SAFETY ECLIPSE 18X1.5 (NEEDLE) ×2 IMPLANT
NS IRRIG 500ML POUR BTL (IV SOLUTION) ×2 IMPLANT
PACK LAMINECTOMY ARMC (PACKS) ×2 IMPLANT
PAD ARMBOARD POSITIONER FOAM (MISCELLANEOUS) ×4 IMPLANT
PIN CASPAR 14 (PIN) ×2 IMPLANT
PIN CASPAR 14MM (PIN) ×1 IMPLANT
PLATE ACP 1.9X54 3LVL (Plate) IMPLANT
SCREW ACP 3.5X17 S/D VARIA (Screw) IMPLANT
SCREW ACP VA SD 3.5X15 (Screw) IMPLANT
SCREW ACP VA ST 4.0 X 17 (Screw) IMPLANT
SPACER C HEDRON 12X14 7M 7D (Spacer) IMPLANT
SPONGE KITTNER 5P (MISCELLANEOUS) ×2 IMPLANT
STAPLER SKIN PROX 35W (STAPLE) IMPLANT
SURGIFLO W/THROMBIN 8M KIT (HEMOSTASIS) ×2 IMPLANT
SUT STRATA 3-0 15 PS-2 (SUTURE) ×2 IMPLANT
SUT VIC AB 3-0 SH 8-18 (SUTURE) ×2 IMPLANT
SUTURE EHLN 3-0 FS-10 30 BLK (SUTURE) IMPLANT
SYR 20ML LL LF (SYRINGE) ×2 IMPLANT
TAPE CLOTH 3X10 WHT NS LF (GAUZE/BANDAGES/DRESSINGS) ×4 IMPLANT
TRAP FLUID SMOKE EVACUATOR (MISCELLANEOUS) ×2 IMPLANT
WATER STERILE IRR 500ML POUR (IV SOLUTION) IMPLANT

## 2023-10-28 NOTE — Plan of Care (Signed)

## 2023-10-28 NOTE — Op Note (Signed)
 Indications: [name] is a 63 y.o. female with M54.12 Cervical radiculopathy , M48.02 Cervical stenosis of spinal canal , R29.898 Left arm weakness , M25.512, G89.29 Chronic left shoulder pain   Findings: stenosis, successful decompression  Preoperative Diagnosis: M54.12 Cervical radiculopathy , M48.02 Cervical stenosis of spinal canal , R29.898 Left arm weakness , M25.512, G89.29 Chronic left shoulder pain  Postoperative Diagnosis: same   EBL: 50 ml IVF: see AR Drains: none Disposition: Extubated and Stable to PACU Complications: none  No foley catheter was placed.   Preoperative Note:   Risks of surgery discussed include: infection, bleeding, stroke, coma, death, paralysis, CSF leak, nerve/spinal cord injury, numbness, tingling, weakness, complex regional pain syndrome, recurrent stenosis and/or disc herniation, vascular injury, development of instability, neck/back pain, need for further surgery, persistent symptoms, development of deformity, and the risks of anesthesia. The patient understood these risks and agreed to proceed.  Operative Note:   Operative Procedure: 1. Anterior Cervical Discectomy C4-5 including bilateral foraminotomies and end plate preparation  2. Anterior Cervical Discectomy C5-6 including bilateral foraminotomies and end plate preparation  3. Anterior Cervical Discectomy C6-7 including bilateral foraminotomies and end plate preparation 4. Anterior Spinal Instrumentation C4 to 7  5. Anterior arthrodesis from C4 to C7 with placement of biomechanical devices at C4-5, C5-6, and C6-7 6. Use of the operative microscope 7. Use of intraoperative flouroscopy  PROCEDURE IN DETAIL: After obtaining informed consent, the patient taken to the operating room, placed in supine position, general anesthesia induced.  The patient had a small shoulder roll placed behind their shoulders.  The patient received preop antibiotics and IV Decadron .  The patient had a neck  incision outlined, was prepped and draped in usual sterile fashion. A timeout was performed. The incision was injected with local anesthetic.   An incision was opened, dissection taken down medial to the carotid artery and jugular vein, lateral to the trachea and esophagus.  The prevertebral fascia identified and a localizing x-ray demonstrated the correct level.  The longus colli were dissected laterally, and self-retaining retractors placed to open the operative field. The microscope was then brought into the field.  With this complete, distractor pins were placed in the vertebral bodies of C4 and C5.  The distractor was placed at C4-5.  The annulus at C4-5 was opened. Curettes and pituitary rongeurs used to remove the majority of disk, then the drill was used to remove the posterior osteophyte, expose the posterior longitudinal ligament, and begin the foraminotomies. The nerve hook was used to elevate the posterior longitudinal ligament, which was then removed with Kerrison rongeurs to complete decompression of the spinal cord. The Kerrison rongeurs were then used to complete the foraminotomies bilaterally to decompress the nerve roots. The nerve hook could be passed out each foramen, ensuring decompression of the nerve roots. Meticulous hemostasis was obtained. A biomechanical device (Globus Hedron C 7 mm height x 14 mm width by 12 mm depth) was placed at C4/5. The device had been filled with demineralized bone matrix for aid in arthrodesis.  Please note that the procedure included removal of the disc, removal of the posterior osteophytes, and removal of the posterior longitudinal ligament to ensure decompression of the spinal cord.  Additionally, foraminotomies were performed on both sides of the spinal canal to decompress the nerve roots.   The caspar distractor was removed. The pin at C4 was removed and bone wax used for hemostasis.  The pin was then repositioned at C7 and the distractor placed from  C5-C7.    The annulus of C5-6 was opened.  Curettes and pituitary rongeurs used to remove the majority of disk, then the drill was used to remove the posterior osteophyte, expose the posterior longitudinal ligament, and begin the foraminotomies. The nerve hook was used to elevate the posterior longitudinal ligament, which was then removed with Kerrison rongeurs to complete decompression of the spinal cord. The Kerrison rongeurs were then used to complete the foraminotomies bilaterally to decompress the nerve roots. The nerve hook could be passed out each foramen, ensuring decompression of the nerve roots. Meticulous hemostasis was obtained. A biomechanical device (Globus Hedron C 7 mm height x 14 mm width by 12 mm depth) was placed at C5/6. The device had been filled with demineralized bone matrix for aid in arthrodesis.  Please note that the procedure included removal of the disc, removal of the posterior osteophytes, and removal of the posterior longitudinal ligament to ensure decompression of the spinal cord.  Additionally, foraminotomies were performed on both sides of the spinal canal to decompress the nerve roots.  The annulus of C6-7 was opened.  Curettes and pituitary rongeurs used to remove the majority of disk, then the drill was used to remove the posterior osteophyte, expose the posterior longitudinal ligament, and begin the foraminotomies. The nerve hook was used to elevate the posterior longitudinal ligament, which was then removed with Kerrison rongeurs to complete decompression of the spinal cord. The Kerrison rongeurs were then used to complete the foraminotomies bilaterally to decompress the nerve roots. The nerve hook could be passed out each foramen, ensuring decompression of the nerve roots. Meticulous hemostasis was obtained. A biomechanical device (Globus Hedron C 7 mm height x 14 mm width by 12 mm depth) was placed at C6/7. The device had been filled with demineralized bone matrix for  aid in arthrodesis.  Please note that the procedure included removal of the disc, removal of the posterior osteophytes, and removal of the posterior longitudinal ligament to ensure decompression of the spinal cord.  Additionally, foraminotomies were performed on both sides of the spinal canal to decompress the nerve roots.  A four segment, three level plate (54 mm Nuvasive ACP) was chosen.  Two screws placed in the vertebral bodies of all four segments, respectively making sure the screws were behind the locking mechanism.  Final AP and lateral radiographs were taken.   Please note that the plate is not inclusive to the biomechanical devices.  The anchoring mechanism of the plate is completely separate from the biomechanical devices.  With everything in good position, the wound was irrigated copiously and meticulous hemostasis obtained.  Wound was closed in 2 layers using interrupted inverted 3-0 Vicryl sutures and monocryl.  The wound was dressed with dermabond, the head of bed at 30 degrees, taken to recovery room in stable condition.  No new postop neurological deficits were identified..  All counts were correct at the end of the case.  I performed the entire procedure with the assistance of Anastacio Karvonen PA as an Designer, television/film set. An assistant was required for this procedure due to the complexity.  The assistant provided assistance in tissue manipulation and suction, and was required for the successful and safe performance of the procedure. I performed the critical portions of the procedure.   Jodeen Munch MD

## 2023-10-28 NOTE — Transfer of Care (Signed)
 Immediate Anesthesia Transfer of Care Note  Patient: Lauren Beck  Procedure(s) Performed: ANTERIOR CERVICAL DECOMPRESSION/DISCECTOMY FUSION 3 LEVELS (Spine Cervical)  Patient Location: PACU  Anesthesia Type:General  Level of Consciousness: drowsy  Airway & Oxygen Therapy: Patient Spontanous Breathing and Patient connected to face mask oxygen  Post-op Assessment: Report given to RN, Post -op Vital signs reviewed and stable, and Patient moving all extremities  Post vital signs: Reviewed and stable  Last Vitals:  Vitals Value Taken Time  BP 165/93 10/28/23 1249  Temp 36.1 C 10/28/23 1243  Pulse 69 10/28/23 1255  Resp 8 10/28/23 1255  SpO2 100 % 10/28/23 1255  Vitals shown include unfiled device data.  Last Pain:  Vitals:   10/28/23 1243  TempSrc:   PainSc: Asleep         Complications: No notable events documented.

## 2023-10-28 NOTE — Anesthesia Preprocedure Evaluation (Signed)
 Anesthesia Evaluation  Patient identified by MRN, date of birth, ID band Patient awake    Reviewed: Allergy & Precautions, NPO status , Patient's Chart, lab work & pertinent test results  History of Anesthesia Complications Negative for: history of anesthetic complications  Airway Mallampati: III  TM Distance: <3 FB Neck ROM: full    Dental  (+) Chipped, Poor Dentition, Missing   Pulmonary COPD, Current Smoker and Patient abstained from smoking.    + decreased breath sounds      Cardiovascular Exercise Tolerance: Good (-) angina (-) Past MI negative cardio ROS Normal cardiovascular exam     Neuro/Psych  PSYCHIATRIC DISORDERS       Neuromuscular disease    GI/Hepatic Neg liver ROS,GERD  Controlled,,  Endo/Other  Hypothyroidism    Renal/GU      Musculoskeletal   Abdominal   Peds  Hematology negative hematology ROS (+)   Anesthesia Other Findings Patient has medical clearance for this procedure.   Past Medical History: No date: Allergic rhinitis No date: Arthritis No date: Bronchitis No date: Cervical spondylitis with radiculitis (HCC) No date: Cervical stenosis of spine No date: COPD (chronic obstructive pulmonary disease) (HCC) No date: Excessive or frequent menstruation No date: GERD (gastroesophageal reflux disease) No date: History of kidney stones No date: History of migraines No date: Hypothyroidism No date: Intermittent claudication (HCC) No date: Mixed hyperlipidemia No date: Nonspecific abnormal electrocardiogram (ECG) (EKG) No date: Pre-diabetes No date: Pure hypercholesterolemia No date: Tobacco use No date: Tobacco use disorder No date: Unspecified adjustment reaction  Past Surgical History: No date: COLONOSCOPY No date: ESOPHAGOGASTRODUODENOSCOPY No date: HAMMER TOE SURGERY; Bilateral No date: PELVIC LAPAROSCOPY     Comment:  exploratory, thought might have a cyst, not seen No date:  TUBAL LIGATION  BMI    Body Mass Index: 27.59 kg/m      Reproductive/Obstetrics negative OB ROS                             Anesthesia Physical Anesthesia Plan  ASA: 3  Anesthesia Plan: General ETT   Post-op Pain Management:    Induction: Intravenous  PONV Risk Score and Plan: Ondansetron , Dexamethasone , Midazolam and Treatment may vary due to age or medical condition  Airway Management Planned: Oral ETT  Additional Equipment:   Intra-op Plan:   Post-operative Plan: Extubation in OR  Informed Consent: I have reviewed the patients History and Physical, chart, labs and discussed the procedure including the risks, benefits and alternatives for the proposed anesthesia with the patient or authorized representative who has indicated his/her understanding and acceptance.     Dental Advisory Given  Plan Discussed with: Anesthesiologist, CRNA and Surgeon  Anesthesia Plan Comments: (Patient consented for risks of anesthesia including but not limited to:  - adverse reactions to medications - damage to eyes, teeth, lips or other oral mucosa - nerve damage due to positioning  - sore throat or hoarseness - Damage to heart, brain, nerves, lungs, other parts of body or loss of life  Patient voiced understanding and assent.)       Anesthesia Quick Evaluation

## 2023-10-28 NOTE — Discharge Instructions (Signed)

## 2023-10-28 NOTE — Interval H&P Note (Signed)
 History and Physical Interval Note:  10/28/2023 9:41 AM  Lauren Beck  has presented today for surgery, with the diagnosis of M54.12 Cervical radiculopathy    M48.02 Cervical stenosis of spinal canal   R29.898 Left arm weakness  M25.512, G89.29 Chronic left shoulder pain.  The various methods of treatment have been discussed with the patient and family. After consideration of risks, benefits and other options for treatment, the patient has consented to  Procedure(s) with comments: ANTERIOR CERVICAL DECOMPRESSION/DISCECTOMY FUSION 3 LEVELS (N/A) - C4-7 ACDF as a surgical intervention.  The patient's history has been reviewed, patient examined, no change in status, stable for surgery.  I have reviewed the patient's chart and labs.  Questions were answered to the patient's satisfaction.    Heart sounds normal no MRG. Chest Clear to Auscultation Bilaterally.   [name]

## 2023-10-28 NOTE — Anesthesia Procedure Notes (Signed)
 Procedure Name: Intubation Date/Time: 10/28/2023 10:19 AM  Performed by: Bobette Burrs, CRNAPre-anesthesia Checklist: Patient identified, Emergency Drugs available, Suction available and Patient being monitored Patient Re-evaluated:Patient Re-evaluated prior to induction Oxygen Delivery Method: Circle system utilized Preoxygenation: Pre-oxygenation with 100% oxygen Induction Type: IV induction Ventilation: Mask ventilation without difficulty Laryngoscope Size: McGrath and 3 Grade View: Grade I Tube type: Oral Tube size: 7.0 mm Number of attempts: 1 Airway Equipment and Method: Stylet, Oral airway and Bite block Placement Confirmation: ETT inserted through vocal cords under direct vision, positive ETCO2 and breath sounds checked- equal and bilateral Secured at: 21 cm Tube secured with: Tape Dental Injury: Teeth and Oropharynx as per pre-operative assessment

## 2023-10-28 NOTE — Anesthesia Postprocedure Evaluation (Signed)
 Anesthesia Post Note  Patient: Market researcher  Procedure(s) Performed: ANTERIOR CERVICAL DECOMPRESSION/DISCECTOMY FUSION 3 LEVELS (Spine Cervical)  Patient location during evaluation: PACU Anesthesia Type: General Level of consciousness: awake and alert Pain management: pain level controlled Vital Signs Assessment: post-procedure vital signs reviewed and stable Respiratory status: spontaneous breathing, nonlabored ventilation, respiratory function stable and patient connected to nasal cannula oxygen Cardiovascular status: blood pressure returned to baseline and stable Postop Assessment: no apparent nausea or vomiting Anesthetic complications: no   No notable events documented.   Last Vitals:  Vitals:   10/28/23 1445 10/28/23 1500  BP: 97/65 99/71  Pulse: 63 66  Resp: 12 15  Temp:    SpO2: 98% 99%    Last Pain:  Vitals:   10/28/23 1500  TempSrc:   PainSc: Asleep                 Portia Brittle Keyaria Lawson

## 2023-10-29 ENCOUNTER — Other Ambulatory Visit: Payer: Self-pay

## 2023-10-29 ENCOUNTER — Encounter: Payer: Self-pay | Admitting: Neurosurgery

## 2023-10-29 MED ORDER — SENNA 8.6 MG PO TABS
1.0000 | ORAL_TABLET | Freq: Two times a day (BID) | ORAL | 0 refills | Status: DC | PRN
  Filled 2023-10-29: qty 30, 15d supply, fill #0

## 2023-10-29 MED ORDER — CELECOXIB 200 MG PO CAPS
200.0000 mg | ORAL_CAPSULE | Freq: Two times a day (BID) | ORAL | 0 refills | Status: DC
  Filled 2023-10-29: qty 30, 15d supply, fill #0

## 2023-10-29 MED ORDER — OXYCODONE HCL 5 MG PO TABS
5.0000 mg | ORAL_TABLET | ORAL | 0 refills | Status: DC | PRN
  Filled 2023-10-29: qty 35, 3d supply, fill #0

## 2023-10-29 MED ORDER — METHOCARBAMOL 500 MG PO TABS
500.0000 mg | ORAL_TABLET | Freq: Four times a day (QID) | ORAL | 0 refills | Status: DC | PRN
  Filled 2023-10-29: qty 120, 30d supply, fill #0

## 2023-10-29 NOTE — Evaluation (Signed)
 Physical Therapy Evaluation Patient Details Name: Lauren Beck MRN: [id number] DOB: Oct 23, 1960 Today's Date: 10/29/2023  History of Present Illness  Pt is s/p Cervical fusion specifically ACDF from C4-C7 on 5/7. History includes COPD, GERD, and migraines.  Clinical Impression  Pt is a pleasant 63 year old female who was admitted for ACDF sx on 10/28/23. Pt performs transfers with mod I and ambulation with supervision and RW. All mobility performed with C collar and cues given for restrictions/precautions. Spouse at bedside supportive. Pt has met all acute rehab goals and would recommend continue to progress to OP PT at this time. Secure chat sent to care team to facilitate. Pt will be dc in house and does not require follow up. RN aware. Will dc current orders.         If plan is discharge home, recommend the following: Help with stairs or ramp for entrance   Can travel by private vehicle        Equipment Recommendations Rolling walker (2 wheels)  Recommendations for Other Services       Functional Status Assessment Patient has had a recent decline in their functional status and demonstrates the ability to make significant improvements in function in a reasonable and predictable amount of time.     Precautions / Restrictions Precautions Precautions: Fall;Cervical Precaution Booklet Issued: No Recall of Precautions/Restrictions: Intact Required Braces or Orthoses: Cervical Brace Cervical Brace: For comfort;Hard collar Restrictions Weight Bearing Restrictions Per Provider Order: No      Mobility  Bed Mobility               General bed mobility comments: NT, received seated at EOB upon arrival    Transfers Overall transfer level: Modified independent Equipment used: Rolling walker (2 wheels)               General transfer comment: cues for safe technique    Ambulation/Gait Ambulation/Gait assistance: Supervision Gait Distance (Feet): 250 Feet Assistive  device: Rolling walker (2 wheels) Gait Pattern/deviations: Step-through pattern       General Gait Details: ambulated around hallway with reciprocal gait pattern. Brace donned in seated position prior to ambulation. Very slow but steady technique  Stairs Stairs: Yes Stairs assistance: Contact guard assist Stair Management: Two rails, Step to pattern Number of Stairs: 4 General stair comments: safe technique. CGA due to limited neck ROM for safety  Wheelchair Mobility     Tilt Bed    Modified Rankin (Stroke Patients Only)       Balance Overall balance assessment: Mild deficits observed, not formally tested                                           Pertinent Vitals/Pain Pain Assessment Pain Assessment: No/denies pain    Home Living Family/patient expects to be discharged to:: Private residence Living Arrangements: Spouse/significant other Available Help at Discharge: Family;Available 24 hours/day Type of Home: Mobile home Home Access: Stairs to enter Entrance Stairs-Rails: Can reach both Entrance Stairs-Number of Steps: 3   Home Layout: One level Home Equipment: None      Prior Function Prior Level of Function : Independent/Modified Independent             Mobility Comments: indep, active, no falls ADLs Comments: indep, likes to garden     Extremity/Trunk Assessment   Upper Extremity Assessment Upper Extremity Assessment: Overall WFL for tasks assessed  Lower Extremity Assessment Lower Extremity Assessment: Overall WFL for tasks assessed       Communication   Communication Communication: No apparent difficulties    Cognition Arousal: Alert Behavior During Therapy: WFL for tasks assessed/performed   PT - Cognitive impairments: No apparent impairments                       PT - Cognition Comments: pleasant and agreeable to session Following commands: Intact       Cueing Cueing Techniques: Verbal cues      General Comments      Exercises Other Exercises Other Exercises: ambulated to bathroom. Able to transfer on/off low toilet and perform self hygiene with indep.   Assessment/Plan    PT Assessment All further PT needs can be met in the next venue of care  PT Problem List Decreased balance;Decreased mobility;Decreased knowledge of precautions       PT Treatment Interventions      PT Goals (Current goals can be found in the Care Plan section)  Acute Rehab PT Goals Patient Stated Goal: to go home today PT Goal Formulation: All assessment and education complete, DC therapy Time For Goal Achievement: 10/29/23 Potential to Achieve Goals: Good    Frequency       Co-evaluation               AM-PAC PT "6 Clicks" Mobility  Outcome Measure Help needed turning from your back to your side while in a flat bed without using bedrails?: None Help needed moving from lying on your back to sitting on the side of a flat bed without using bedrails?: None Help needed moving to and from a bed to a chair (including a wheelchair)?: None Help needed standing up from a chair using your arms (e.g., wheelchair or bedside chair)?: None Help needed to walk in hospital room?: None Help needed climbing 3-5 steps with a railing? : None 6 Click Score: 24    End of Session   Activity Tolerance: Patient tolerated treatment well Patient left:  (left with OT) Nurse Communication: Mobility status PT Visit Diagnosis: Muscle weakness (generalized) (M62.81);Difficulty in walking, not elsewhere classified (R26.2)    Time: 0109-3235 PT Time Calculation (min) (ACUTE ONLY): 19 min   Charges:   PT Evaluation $PT Eval Low Complexity: 1 Low PT Treatments $Gait Training: 8-22 mins PT General Charges $$ ACUTE PT VISIT: 1 Visit         Amparo Balk, PT, DPT, GCS [phone]   Maurio Baize 10/29/2023, 9:47 AM

## 2023-10-29 NOTE — Plan of Care (Signed)
 Pt BP soft; no c/o pain;pt  wanted med for ingestion. Continue to monitor. Problem: Education: Goal: Knowledge of General Education information will improve Description: Including pain rating scale, medication(s)/side effects and non-pharmacologic comfort measures Outcome: Progressing   Problem: Health Behavior/Discharge Planning: Goal: Ability to manage health-related needs will improve Outcome: Progressing   Problem: Clinical Measurements: Goal: Ability to maintain clinical measurements within normal limits will improve Outcome: Progressing Goal: Will remain free from infection Outcome: Progressing Goal: Diagnostic test results will improve Outcome: Progressing Goal: Respiratory complications will improve Outcome: Progressing Goal: Cardiovascular complication will be avoided Outcome: Progressing   Problem: Activity: Goal: Risk for activity intolerance will decrease Outcome: Progressing   Problem: Nutrition: Goal: Adequate nutrition will be maintained Outcome: Progressing   Problem: Coping: Goal: Level of anxiety will decrease Outcome: Progressing   Problem: Elimination: Goal: Will not experience complications related to bowel motility Outcome: Progressing Goal: Will not experience complications related to urinary retention Outcome: Progressing   Problem: Pain Managment: Goal: General experience of comfort will improve and/or be controlled Outcome: Progressing   Problem: Safety: Goal: Ability to remain free from injury will improve Outcome: Progressing   Problem: Skin Integrity: Goal: Risk for impaired skin integrity will decrease Outcome: Progressing   Problem: Education: Goal: Ability to verbalize activity precautions or restrictions will improve Outcome: Progressing Goal: Knowledge of the prescribed therapeutic regimen will improve Outcome: Progressing Goal: Understanding of discharge needs will improve Outcome: Progressing   Problem: Activity: Goal:  Ability to avoid complications of mobility impairment will improve Outcome: Progressing Goal: Ability to tolerate increased activity will improve Outcome: Progressing Goal: Will remain free from falls Outcome: Progressing   Problem: Bowel/Gastric: Goal: Gastrointestinal status for postoperative course will improve Outcome: Progressing   Problem: Clinical Measurements: Goal: Ability to maintain clinical measurements within normal limits will improve Outcome: Progressing Goal: Postoperative complications will be avoided or minimized Outcome: Progressing Goal: Diagnostic test results will improve Outcome: Progressing   Problem: Pain Management: Goal: Pain level will decrease Outcome: Progressing   Problem: Skin Integrity: Goal: Will show signs of wound healing Outcome: Progressing   Problem: Health Behavior/Discharge Planning: Goal: Identification of resources available to assist in meeting health care needs will improve Outcome: Progressing   Problem: Bladder/Genitourinary: Goal: Urinary functional status for postoperative course will improve Outcome: Progressing

## 2023-10-29 NOTE — Progress Notes (Addendum)
   Neurosurgery Progress Note  History: Lauren Beck is s/p C4-7 ACDF  POD1: doing well this morning with expected neck pain and continued left shoulder pain.   Physical Exam: Vitals:   10/29/23 0435 10/29/23 0737  BP: (!) 93/53 95/74  Pulse: 69 69  Resp: 15 16  Temp: (!) 97.5 F (36.4 C) (!) 97.5 F (36.4 C)  SpO2: 94% 97%    AA Ox3 CNI  Strength: Side Biceps Triceps Deltoid Interossei Grip Wrist Ext. Wrist Flex.  R 5 5 5 5 5 5 5   L 4+ 4+ 5 4+ 5 5 5     Side Iliopsoas Quads Hamstring PF DF EHL  R 5 5 5 5 5 5   L 5 5 5 5 5 5    Incision c/d/I with dermabond in place. Trachea midline   Data:  Other tests/results: see results review  Assessment/Plan:  Lauren Beck is a 63 y.o presenting with cervical radiculopathy and left arm weakness s/p C4-7 ACDF.   - mobilize - pain control - DVT prophylaxis - PTOT; aspen collar for comfort when OOB. Dispo planning pending therapy recommendations   Anastacio Karvonen PA-C Department of Neurosurgery

## 2023-10-29 NOTE — Evaluation (Signed)
 Occupational Therapy Evaluation Patient Details Name: Lauren Beck MRN: [id number] DOB: 11-Jul-1960 Today's Date: 10/29/2023   History of Present Illness   Pt is s/p Cervical fusion specifically ACDF from C4-C7 on 5/7. History includes COPD, GERD, and migraines.   Clinical Impressions Pt seen for OT evaluation this date, POD#1 from above surgery. Prior to hospital admission, pt was independent with mobility, ADL, and IADL. Pt lives with spouse in a single family home with steps to enter and bilateral handrails with spouse able to provide 24/7 assist/support as needed for pt. Currently pt performs ADL mobility with RW and supv, mod indep with ADL transfers, indep with toileting, and requires PRN Assist for LB ADL tasks, which spouse can provide. Pt presents with impairments in cervical ROM following surgery. Pt/spouse educated in cervical body mechanics, home/routines modifications, AE/DME, and pet care considerations to maximize safety during recovery. Handout provided. Pt verbalized understanding of all education/training provided. No additional skilled OT needs at this time. Will discharge in house. Upon hospital discharge, pt safe to discharge home.    If plan is discharge home, recommend the following:   A little help with bathing/dressing/bathroom;Assistance with cooking/housework;Assist for transportation;Help with stairs or ramp for entrance     Functional Status Assessment   Patient has had a recent decline in their functional status and demonstrates the ability to make significant improvements in function in a reasonable and predictable amount of time.     Equipment Recommendations   Other (comment) (spouse going to see if neighbor/friend has a shower chair to borrow)     Recommendations for Other Services         Precautions/Restrictions   Precautions Precautions: Fall;Cervical Precaution Booklet Issued: Yes (comment) Recall of Precautions/Restrictions:  Intact Required Braces or Orthoses: Cervical Brace Cervical Brace: For comfort;Hard collar Restrictions Weight Bearing Restrictions Per Provider Order: No     Mobility Bed Mobility Overal bed mobility: Modified Independent             General bed mobility comments: uses log roll at baseline    Transfers Overall transfer level: Modified independent Equipment used: Rolling walker (2 wheels)                      Balance Overall balance assessment: Mild deficits observed, not formally tested                                         ADL either performed or assessed with clinical judgement   ADL Overall ADL's : Modified independent                                       General ADL Comments: PRN assist from spouse available at discharge. Pt indep with toileting, socks, brace application     Vision         Perception         Praxis         Pertinent Vitals/Pain Pain Assessment Pain Assessment: No/denies pain     Extremity/Trunk Assessment Upper Extremity Assessment Upper Extremity Assessment: Overall WFL for tasks assessed;LUE deficits/detail LUE Deficits / Details: hx of L shoulder pain but functional for assessment   Lower Extremity Assessment Lower Extremity Assessment: Overall WFL for tasks assessed   Cervical / Trunk Assessment Cervical / Trunk Assessment:  Neck Surgery   Communication Communication Communication: No apparent difficulties   Cognition Arousal: Alert Behavior During Therapy: WFL for tasks assessed/performed Cognition: No apparent impairments                               Following commands: Intact       Cueing  General Comments   Cueing Techniques: Verbal cues      Exercises Other Exercises Other Exercises: Pt/spouse educated in cervical body mechanics, home/routines modifications, AE/DME, and pet care considerations to maximize safety during recovery. Handout  provided.   Shoulder Instructions      Home Living Family/patient expects to be discharged to:: Private residence Living Arrangements: Spouse/significant other Available Help at Discharge: Family;Available 24 hours/day Type of Home: Mobile home Home Access: Stairs to enter Entrance Stairs-Number of Steps: 3 Entrance Stairs-Rails: Can reach both Home Layout: One level     Bathroom Shower/Tub: Producer, television/film/video: Standard     Home Equipment: None;Grab bars - tub/shower;Hand held shower head          Prior Functioning/Environment Prior Level of Function : Independent/Modified Independent             Mobility Comments: indep, active, no falls ADLs Comments: indep, likes to garden    OT Problem List: Decreased range of motion   OT Treatment/Interventions:        OT Goals(Current goals can be found in the care plan section)   Acute Rehab OT Goals Patient Stated Goal: go home OT Goal Formulation: All assessment and education complete, DC therapy   OT Frequency:       Co-evaluation              AM-PAC OT "6 Clicks" Daily Activity     Outcome Measure Help from another person eating meals?: None Help from another person taking care of personal grooming?: None Help from another person toileting, which includes using toliet, bedpan, or urinal?: None Help from another person bathing (including washing, rinsing, drying)?: A Little Help from another person to put on and taking off regular upper body clothing?: None Help from another person to put on and taking off regular lower body clothing?: A Little 6 Click Score: 22   End of Session Equipment Utilized During Treatment: Rolling walker (2 wheels);Cervical collar  Activity Tolerance: Patient tolerated treatment well Patient left: in bed;with call bell/phone within reach;with family/visitor present  OT Visit Diagnosis: Other abnormalities of gait and mobility (R26.89)                Time:  0454-0981 OT Time Calculation (min): 15 min Charges:  OT General Charges $OT Visit: 1 Visit OT Evaluation $OT Eval Low Complexity: 1 Low OT Treatments $Self Care/Home Management : 8-22 mins  Berenda Breaker., MPH, MS, OTR/L ascom [phone] 10/29/23, 9:59 AM

## 2023-10-29 NOTE — Discharge Summary (Signed)
 Discharge Summary  Patient ID: Lauren Beck MRN: [id number] DOB/AGE: April 08, 1961 63 y.o.  Admit date: 10/28/2023 Discharge date: 10/29/2023  Admission Diagnoses: M54.12 Cervical radiculopathy , M48.02 Cervical stenosis of spinal canal , R29.898 Left arm weakness , M25.512, G89.29 Chronic left shoulder pain   Discharge Diagnoses:  Principal Problem:   S/P cervical spinal fusion Active Problems:   Cervical radiculopathy   Cervical stenosis of spinal canal   Left arm weakness   Chronic left shoulder pain   Discharged Condition: good  Hospital Course:  Lauren Beck is a 63 y.o presenting with cervical radiculopathy and BX.  Her intraoperative course was uncomplicated.  She was admitted overnight for monitoring and therapy evaluation.  She was seen by therapy and deemed appropriate for discharge home on POD1. She denied any difficulty swallowing.  She was given prescriptions for oxycodone , Robaxin, and Senna to take as needed.   Consults: None  Significant Diagnostic Studies: NA  Treatments: surgery: As above.  Please see separately dictated operative report for further details.  Discharge Exam: Blood pressure 95/74, pulse 69, temperature (!) 97.5 F (36.4 C), temperature source Oral, resp. rate 16, height 5\' 1"  (1.549 m), weight 70 kg, last menstrual period 04/06/2012, SpO2 97%.  AA Ox3 CNI   Strength: Side Biceps Triceps Deltoid Interossei Grip Wrist Ext. Wrist Flex.  R 5 5 5 5 5 5 5   L 4+ 4+ 5 4+ 5 5 5     Side Iliopsoas Quads Hamstring PF DF EHL  R 5 5 5 5 5 5   L 5 5 5 5 5 5    Incision c/d/I with dermabond in place. Trachea midline   Disposition: Discharge disposition: 01-Home or Self Care       Discharge Instructions     For home use only DME Walker rolling   Complete by: As directed    Walker: With 5 Inch Wheels   Patient needs a walker to treat with the following condition: S/P cervical spinal fusion   Incentive spirometry RT   Complete by: As directed        Allergies as of 10/29/2023       Reactions   Benadryl [diphenhydramine Hcl] Other (See Comments)   Makes patient extremely drowsy     Hydrocodone     States has headaches from such   Levaquin  [levofloxacin ]    arthralgia        Medication List     STOP taking these medications    ibuprofen 200 MG tablet Commonly known as: ADVIL       TAKE these medications    acetaminophen  500 MG tablet Commonly known as: TYLENOL  Take 1,000 mg by mouth every 6 (six) hours as needed for moderate pain (pain score 4-6).   APPLE CIDER VINEGAR PO Take 1 capsule by mouth daily.   atorvastatin  10 MG tablet Commonly known as: LIPITOR Take 1 tablet (10 mg total) by mouth daily. What changed: when to take this   bismuth subsalicylate 262 MG/15ML suspension Commonly known as: PEPTO BISMOL Take 15 mLs by mouth every 6 (six) hours as needed for indigestion or diarrhea or loose stools.   celecoxib 200 MG capsule Commonly known as: CeleBREX Take 1 capsule (200 mg total) by mouth 2 (two) times daily.   cetirizine  10 MG tablet Commonly known as: ZYRTEC  Take 1 tablet (10 mg total) by mouth daily. What changed:  when to take this reasons to take this   fluticasone  50 MCG/ACT nasal spray Commonly known as: FLONASE  Place 2  sprays into both nostrils daily. What changed:  when to take this reasons to take this   methocarbamol 500 MG tablet Commonly known as: ROBAXIN Take 1 tablet (500 mg total) by mouth every 6 (six) hours as needed for muscle spasms.   montelukast  10 MG tablet Commonly known as: SINGULAIR  Take 10 mg by mouth at bedtime as needed (allergies).   multivitamin with minerals Tabs tablet Take 1 tablet by mouth daily.   Omeprazole -Sodium Bicarbonate 20-1100 MG Caps capsule Commonly known as: ZEGERID Take 1 capsule by mouth daily as needed (heartburn).   oxyCODONE  5 MG immediate release tablet Commonly known as: Oxy IR/ROXICODONE  Take 1-2 tablets (5-10 mg total)  by mouth every 4 (four) hours as needed for moderate pain (pain score 4-6).   senna 8.6 MG Tabs tablet Commonly known as: SENOKOT Take 1 tablet (8.6 mg total) by mouth 2 (two) times daily as needed for mild constipation.   Synthroid  150 MCG tablet Generic drug: levothyroxine  Take one po qd for six days, do not take on day 7 What changed:  how much to take how to take this when to take this additional instructions   Trelegy Ellipta  100-62.5-25 MCG/ACT Aepb Generic drug: Fluticasone -Umeclidin-Vilant Inhale 1 puff into the lungs daily.               Durable Medical Equipment  (From admission, onward)           Start     Ordered   10/29/23 0000  For home use only DME Walker rolling       Question Answer Comment  Walker: With 5 Inch Wheels   Patient needs a walker to treat with the following condition S/P cervical spinal fusion      10/29/23 0939             Signed: Noble Bateman 10/29/2023, 9:56 AM

## 2023-10-29 NOTE — TOC Transition Note (Addendum)
 Transition of Care [facility]) - Discharge Note   Patient Details  Name: Lauren Beck MRN: [id number] Date of Birth: [birth year]  Transition of Care [facility] CM/SW Contact:  Crayton Docker, RN 10/29/2023, 10:33 AM   Clinical Narrative:     CM to patient's room regarding pending discharge. Patient's husband, Tim at patient's bedside, will provide transportation home and caregiver support. Order for rolling walker noted. CM secure message to Sam Creighton, [employer] regarding bedside delivery.   Secure message received from Sam Creighton, [employer] regarding rolling walker out of pocket cost of $75 due to insurance non-coverage. CM call to patient's husband, Wandalee Gust regarding out of pocket cost and other vendors for rolling walker. Per patient's husband, will purchase patient's rolling walker from another vendor.  Patient Goals and CMS Choice    OP Rehab   Discharge Placement       Home/self care           D<E: rolling walker   Discharge Plan and Services Additional resources added to the After Visit Summary for      Home/self care OP Rehab DME: rolling walker              Social Drivers of Health (SDOH) Interventions SDOH Screenings   Food Insecurity: No Food Insecurity (10/28/2023)  Housing: Unknown (10/28/2023)  Transportation Needs: No Transportation Needs (10/28/2023)  Utilities: Not At Risk (10/28/2023)  Alcohol Screen: Low Risk  (10/16/2023)  Depression (PHQ2-9): Medium Risk (10/19/2023)  Financial Resource Strain: Patient Declined (10/16/2023)  Physical Activity: Unknown (10/16/2023)  Social Connections: Unknown (10/16/2023)  Stress: Stress Concern Present (10/16/2023)  Tobacco Use: High Risk (10/28/2023)     Readmission Risk Interventions     No data to display

## 2023-11-12 ENCOUNTER — Ambulatory Visit (INDEPENDENT_AMBULATORY_CARE_PROVIDER_SITE_OTHER): Admitting: Neurosurgery

## 2023-11-12 ENCOUNTER — Encounter: Payer: Self-pay | Admitting: Neurosurgery

## 2023-11-12 VITALS — BP 124/78 | Temp 97.9°F | Ht 61.0 in | Wt 154.0 lb

## 2023-11-12 DIAGNOSIS — Z981 Arthrodesis status: Secondary | ICD-10-CM

## 2023-11-12 DIAGNOSIS — R29898 Other symptoms and signs involving the musculoskeletal system: Secondary | ICD-10-CM

## 2023-11-12 DIAGNOSIS — M4802 Spinal stenosis, cervical region: Secondary | ICD-10-CM

## 2023-11-12 MED ORDER — OXYCODONE HCL 5 MG PO TABS: 5.0000 mg | ORAL_TABLET | Freq: Three times a day (TID) | ORAL | 0 refills | Status: AC | PRN

## 2023-11-12 MED ORDER — CELECOXIB 200 MG PO CAPS: 200.0000 mg | ORAL_CAPSULE | Freq: Two times a day (BID) | ORAL | 0 refills | Status: DC

## 2023-11-12 NOTE — Progress Notes (Signed)
   REFERRING PHYSICIAN:  [name], Fnp [address] [name],  [place] [address]  DOS: 10/28/23 C4-7 ACDF  HISTORY OF PRESENT ILLNESS: Lauren Beck is about 2 weeks status post ACDF. Overall, she is doing fairly well after her recent surgery. She does continue to have some pain across her shoulders and some difficulty swallowing but feels the pain into her hands and arms is improved. She does admit that she is largely tolerating a normal diet. She admits to ongoing numbness in her arms as well and continued shoulder pain left>right She continues to take Celebrex  and Oxycodone  as well as Robaxin  as needed.   PHYSICAL EXAMINATION:  NEUROLOGICAL:  General: In no acute distress.   Awake, alert, oriented to person, place, and time.  Pupils equal round and reactive to light.  Strength: Side Biceps Triceps Deltoid Interossei Grip Wrist Ext. Wrist Flex.  R 5 5 5 5 5 5 5   L 5 4- (limited due to shoulder pain 5 4+ 5 5 5    Incision c/d/I  Imaging:  No interval imaging to review  Assessment / Plan: Lauren Beck is doing well after recent ACDF despite expected neck pain and some persistent dysphagia.  She does seem to have left greater than right shoulder pain on exam which is resistant from her as well.  She did discuss the potential for underlying shoulder pathology with Dr. Jeris Montes prior to surgery.  I reviewed that we we will continue to reevaluate at subsequent follow-ups.  I discussed continued modifications to her diet as needed for her dysphagia and provided encouragement that this will likely improve given time.  She is requesting a refill of her oxycodone  which she is currently taking 2-3 times a day as well as a refill of her Celebrex .  We discussed activity escalation and I have advised the patient to lift up to 10 pounds until 6 weeks after surgery, then increase up to 25 pounds until 12 weeks after surgery.  After 12 weeks post-op, the patient advised to increase activity as  tolerated. she will return to clinic in approximately 4 weeks to see Dr. Mont Antis with xrays prior.   Advised to contact the office if any questions or concerns arise.   Anastacio Karvonen PA-C Dept of Neurosurgery

## 2023-12-04 ENCOUNTER — Other Ambulatory Visit: Payer: Self-pay

## 2023-12-04 DIAGNOSIS — M4802 Spinal stenosis, cervical region: Secondary | ICD-10-CM

## 2023-12-08 ENCOUNTER — Ambulatory Visit
Admission: RE | Admit: 2023-12-08 | Discharge: 2023-12-08 | Disposition: A | Discharge status: Home / Self Care | Source: Ambulatory Visit | Attending: Neurosurgery | Admitting: Neurosurgery

## 2023-12-08 ENCOUNTER — Ambulatory Visit (INDEPENDENT_AMBULATORY_CARE_PROVIDER_SITE_OTHER): Admitting: Neurosurgery

## 2023-12-08 ENCOUNTER — Encounter: Payer: Self-pay | Admitting: Neurosurgery

## 2023-12-08 ENCOUNTER — Ambulatory Visit
Admission: RE | Admit: 2023-12-08 | Discharge: 2023-12-08 | Disposition: A | Discharge status: Home / Self Care | Attending: Neurosurgery | Admitting: Neurosurgery

## 2023-12-08 VITALS — BP 102/66 | Temp 97.6°F | Ht 61.0 in | Wt 154.0 lb

## 2023-12-08 DIAGNOSIS — M4802 Spinal stenosis, cervical region: Secondary | ICD-10-CM | POA: Diagnosis present

## 2023-12-08 DIAGNOSIS — G8929 Other chronic pain: Secondary | ICD-10-CM

## 2023-12-08 DIAGNOSIS — Z981 Arthrodesis status: Secondary | ICD-10-CM

## 2023-12-08 DIAGNOSIS — M5412 Radiculopathy, cervical region: Secondary | ICD-10-CM

## 2023-12-08 NOTE — Progress Notes (Signed)
   REFERRING PHYSICIAN:  [name], Fnp [address] [place],  [place] [address]  DOS: 10/28/23 C4-7 ACDF  HISTORY OF PRESENT ILLNESS: Lauren Beck is status post ACDF.    She is doing well.  Her symptoms are improved compared to surgery.  She still has some pain with rotation to the right.  She still having pain around her left shoulder.  She has pain with range of motion.   PHYSICAL EXAMINATION:  NEUROLOGICAL:  General: In no acute distress.   Awake, alert, oriented to person, place, and time.  Pupils equal round and reactive to light.  Strength: Side Biceps Triceps Deltoid Interossei Grip Wrist Ext. Wrist Flex.  R 5 5 5 5 5 5 5   L 5 4- (limited due to shoulder pain 5 4+ 5 5 5    Incision c/d/I  Imaging:  No complications noted  Assessment / Plan: Lauren Beck is doing well after recent ACDF despite expected neck pain and some persistent dysphagia.  Her swallowing has shown some improvement.  I expect it will continue to improve.  I have given her exercises for her neck.  I am pleased with her outcome so far.  We reviewed her activity limitations.  I encouraged her to quit smoking.  I will refer her for left shoulder evaluation.    Jodeen Munch MD Dept of Neurosurgery

## 2023-12-22 ENCOUNTER — Telehealth: Payer: Self-pay

## 2023-12-22 DIAGNOSIS — G8929 Other chronic pain: Secondary | ICD-10-CM

## 2023-12-22 NOTE — Telephone Encounter (Signed)
 LVM for patient to go to have xrays before appointment and the address was provided   orders are in

## 2023-12-23 ENCOUNTER — Ambulatory Visit
Admission: RE | Admit: 2023-12-23 | Discharge: 2023-12-23 | Disposition: A | Discharge status: Home / Self Care | Attending: Orthopedic Surgery | Admitting: Orthopedic Surgery

## 2023-12-23 ENCOUNTER — Encounter: Payer: Self-pay | Admitting: Orthopedic Surgery

## 2023-12-23 ENCOUNTER — Ambulatory Visit
Admission: RE | Admit: 2023-12-23 | Discharge: 2023-12-23 | Disposition: A | Discharge status: Home / Self Care | Source: Ambulatory Visit | Attending: Orthopedic Surgery

## 2023-12-23 ENCOUNTER — Ambulatory Visit (INDEPENDENT_AMBULATORY_CARE_PROVIDER_SITE_OTHER): Admitting: Orthopedic Surgery

## 2023-12-23 DIAGNOSIS — M25512 Pain in left shoulder: Secondary | ICD-10-CM | POA: Diagnosis present

## 2023-12-23 DIAGNOSIS — M25511 Pain in right shoulder: Secondary | ICD-10-CM | POA: Diagnosis present

## 2023-12-23 DIAGNOSIS — G8929 Other chronic pain: Secondary | ICD-10-CM | POA: Insufficient documentation

## 2023-12-23 NOTE — Progress Notes (Unsigned)
 New Patient Visit  Assessment: Lauren Beck is a 63 y.o. female with the following: 1. Chronic left shoulder pain  Plan: Gali Spinney has pain in bilateral shoulders.  Left is worse than right.  Radiographs obtained today are without acute injury.  No evidence of chronic injury.  She describes a lot of radiating pain.  This could be irritation of the tendons.  We discussed proceeding with an injection, and she would like to wait, to ensure that she has appropriate transportation, because she has had bad experiences in the past.  Injection could be both therapeutic and diagnostic.  This was discussed with the patient.  Pain is atraumatic.  She does not have a frozen shoulder.  This could be some discomfort following her cervical spine fusion.  This was discussed with the patient.  All questions have been answered.  She will follow-up if she is interested in an injection.  Follow-up: Return for 2-3 weeks.  Subjective:  Chief Complaint  Patient presents with   Shoulder Pain    Pain in bil shoulders for 3 years and has become worse since May of this year. She does not take any medications at this time     History of Present Illness: Lauren Beck is a 63 y.o. female who has been referred by Reeves Nine, MD for evaluation of bilateral shoulder pain.  Left is worse than right.  She states that she has had pain in both shoulders for 3 years.  It got worse earlier this year.  No specific injury.  She has undergone cervical spine fusion less than 2 months ago.  Since then, the pain has been a lot worse.  She has a lot of pain radiating distally.  She has limited motion of both shoulders, with exquisite pain, especially in the left.  She is currently not taking medications for her left shoulder.  She cannot take NSAIDs, as she recovers from her recent cervical spine surgery.   Review of Systems: No fevers or chills No numbness or tingling No chest pain No shortness of breath No bowel or  bladder dysfunction No GI distress No headaches   Medical History:  Past Medical History:  Diagnosis Date   Allergic rhinitis    Arthritis    Bronchitis    Cervical spondylitis with radiculitis (HCC)    Cervical stenosis of spine    COPD (chronic obstructive pulmonary disease) (HCC)    Excessive or frequent menstruation    GERD (gastroesophageal reflux disease)    History of kidney stones    History of migraines    Hypothyroidism    Intermittent claudication (HCC)    Mixed hyperlipidemia    Nonspecific abnormal electrocardiogram (ECG) (EKG)    Pre-diabetes    Pure hypercholesterolemia    Tobacco use    Tobacco use disorder    Unspecified adjustment reaction     Past Surgical History:  Procedure Laterality Date   ANTERIOR CERVICAL DECOMP/DISCECTOMY FUSION N/A 10/28/2023   Procedure: ANTERIOR CERVICAL DECOMPRESSION/DISCECTOMY FUSION 3 LEVELS;  Surgeon: Clois Reeves, MD;  Location: [facility] ORS;  Service: Neurosurgery;  Laterality: N/A;  C4-7 ACDF   COLONOSCOPY     ESOPHAGOGASTRODUODENOSCOPY     HAMMER TOE SURGERY Bilateral    PELVIC LAPAROSCOPY     exploratory, thought might have a cyst, not seen   TUBAL LIGATION      Family History  Problem Relation Age of Onset   Other Father        No contact--unknown   Breast  cancer Sister    Thyroid  disease Daughter    Anxiety disorder Daughter    OCD Daughter    Rectal cancer Neg Hx    Esophageal cancer Neg Hx    Stomach cancer Neg Hx    Colon cancer Neg Hx    Social History   Tobacco Use   Smoking status: Every Day    Current packs/day: 1.50    Average packs/day: 1.5 packs/day for 45.5 years (68.3 ttl pk-yrs)    Types: Cigarettes    Start date: 1980   Smokeless tobacco: Never  Vaping Use   Vaping status: Former   Substances: Nicotine   Substance Use Topics   Alcohol use: Yes    Alcohol/week: 1.0 standard drink of alcohol    Types: 1 Glasses of wine per week    Comment: occasional wine   Drug use: No     Comment: tried pot once, didn't like it    Allergies  Allergen Reactions   Benadryl [Diphenhydramine Hcl] Other (See Comments)    Makes patient extremely drowsy     Hydrocodone      States has headaches from such   Levaquin  [Levofloxacin ]     arthralgia    Current Meds  Medication Sig   acetaminophen  (TYLENOL ) 500 MG tablet Take 1,000 mg by mouth every 6 (six) hours as needed for moderate pain (pain score 4-6).   APPLE CIDER VINEGAR PO Take 1 capsule by mouth daily.   atorvastatin  (LIPITOR) 10 MG tablet Take 1 tablet (10 mg total) by mouth daily. (Patient taking differently: Take 10 mg by mouth at bedtime.)   bismuth  subsalicylate (PEPTO BISMOL) 262 MG/15ML suspension Take 15 mLs by mouth every 6 (six) hours as needed for indigestion or diarrhea or loose stools.   celecoxib  (CELEBREX ) 200 MG capsule Take 1 capsule (200 mg total) by mouth 2 (two) times daily.   cetirizine  (ZYRTEC ) 10 MG tablet Take 1 tablet (10 mg total) by mouth daily. (Patient taking differently: Take 10 mg by mouth daily as needed for allergies.)   fluticasone  (FLONASE ) 50 MCG/ACT nasal spray Place 2 sprays into both nostrils daily. (Patient taking differently: Place 2 sprays into both nostrils daily as needed for allergies.)   Fluticasone -Umeclidin-Vilant (TRELEGY ELLIPTA ) 100-62.5-25 MCG/ACT AEPB Inhale 1 puff into the lungs daily.   methocarbamol  (ROBAXIN ) 500 MG tablet Take 1 tablet (500 mg total) by mouth every 6 (six) hours as needed for muscle spasms.   montelukast  (SINGULAIR ) 10 MG tablet Take 10 mg by mouth at bedtime as needed (allergies).   Multiple Vitamin (MULTIVITAMIN WITH MINERALS) TABS tablet Take 1 tablet by mouth daily.   Omeprazole -Sodium Bicarbonate (ZEGERID) 20-1100 MG CAPS capsule Take 1 capsule by mouth daily as needed (heartburn).   senna (SENOKOT) 8.6 MG TABS tablet Take 1 tablet (8.6 mg total) by mouth 2 (two) times daily as needed for mild constipation.   SYNTHROID  150 MCG tablet Take one po qd  for six days, do not take on day 7 (Patient taking differently: Take 150 mcg by mouth See admin instructions. Take one po qd for six days, do not take on day 7-TAKES AT BEDTIME)    Objective: LMP 04/06/2012   Physical Exam:  General: Alert and oriented. and No acute distress. Gait: Normal gait.  Bilateral shoulders without deformity.  No redness.  No swelling.  No point tenderness.  Limited range of motion due to pain.  90 degrees of forward flexion.  Internal rotation to her back pocket.  Passively, her external  rotation is similar between shoulders.  Fingers are warm and well-perfused.  Sensation intact throughout the bilateral hands.  IMAGING: I personally ordered and reviewed the following images   X-rays of bilateral shoulders were completed.  These are available in clinic today.  No acute injuries.  No evidence of proximal humeral migration.  Minimal degenerative changes.   New Medications:  No orders of the defined types were placed in this encounter.     Oneil DELENA Horde, MD  12/24/2023 9:55 AM

## 2023-12-23 NOTE — Patient Instructions (Signed)
 Please get the XR of both shoulders that have been ordered.   I can see you back in clinic in 2 weeks, or any time after the to discuss the imaging and a possible injection.

## 2023-12-31 ENCOUNTER — Ambulatory Visit: Admitting: Family

## 2024-01-07 ENCOUNTER — Telehealth: Payer: Self-pay | Admitting: Family

## 2024-01-07 ENCOUNTER — Ambulatory Visit: Admitting: Family

## 2024-01-07 VITALS — Temp 97.7°F | Ht 61.0 in | Wt 134.0 lb

## 2024-01-07 DIAGNOSIS — J324 Chronic pansinusitis: Secondary | ICD-10-CM

## 2024-01-07 DIAGNOSIS — J4489 Other specified chronic obstructive pulmonary disease: Secondary | ICD-10-CM

## 2024-01-07 DIAGNOSIS — H8113 Benign paroxysmal vertigo, bilateral: Secondary | ICD-10-CM

## 2024-01-07 DIAGNOSIS — E782 Mixed hyperlipidemia: Secondary | ICD-10-CM | POA: Diagnosis not present

## 2024-01-07 DIAGNOSIS — R7989 Other specified abnormal findings of blood chemistry: Secondary | ICD-10-CM | POA: Diagnosis not present

## 2024-01-07 DIAGNOSIS — R7303 Prediabetes: Secondary | ICD-10-CM | POA: Diagnosis not present

## 2024-01-07 DIAGNOSIS — E039 Hypothyroidism, unspecified: Secondary | ICD-10-CM

## 2024-01-07 DIAGNOSIS — Z720 Tobacco use: Secondary | ICD-10-CM

## 2024-01-07 DIAGNOSIS — J3089 Other allergic rhinitis: Secondary | ICD-10-CM

## 2024-01-07 LAB — COMPREHENSIVE METABOLIC PANEL WITH GFR
ALT: 13 U/L (ref 0–35)
AST: 15 U/L (ref 0–37)
Albumin: 4 g/dL (ref 3.5–5.2)
Alkaline Phosphatase: 103 U/L (ref 39–117)
BUN: 15 mg/dL (ref 6–23)
CO2: 29 meq/L (ref 19–32)
Calcium: 9.3 mg/dL (ref 8.4–10.5)
Chloride: 106 meq/L (ref 96–112)
Creatinine, Ser: 0.76 mg/dL (ref 0.40–1.20)
GFR: 84.03 mL/min (ref >60.00)
Glucose, Bld: 91 mg/dL (ref 70–99)
Potassium: 4.2 meq/L (ref 3.5–5.1)
Sodium: 141 meq/L (ref 135–145)
Total Bilirubin: 0.3 mg/dL (ref 0.2–1.2)
Total Protein: 6.5 g/dL (ref 6.0–8.3)

## 2024-01-07 LAB — LIPID PANEL
Cholesterol: 145 mg/dL (ref 0–200)
HDL: 48.5 mg/dL (ref >39.00)
LDL Cholesterol: 71 mg/dL (ref 0–99)
NonHDL: 96.4
Total CHOL/HDL Ratio: 3
Triglycerides: 126 mg/dL (ref 0.0–149.0)
VLDL: 25.2 mg/dL (ref 0.0–40.0)

## 2024-01-07 LAB — T3, FREE: T3, Free: 3.1 pg/mL (ref 2.3–4.2)

## 2024-01-07 LAB — HEMOGLOBIN A1C: Hgb A1c MFr Bld: 6.1 % (ref 4.6–6.5)

## 2024-01-07 LAB — T4, FREE: Free T4: 1.49 ng/dL (ref 0.60–1.60)

## 2024-01-07 LAB — TSH: TSH: 0.06 u[IU]/mL — ABNORMAL LOW (ref 0.35–5.50)

## 2024-01-07 MED ORDER — MECLIZINE HCL 12.5 MG PO TABS: ORAL_TABLET | ORAL | 0 refills | Status: DC

## 2024-01-07 MED ORDER — FLUTICASONE PROPIONATE 50 MCG/ACT NA SUSP: 2.0000 | Freq: Every day | NASAL | 6 refills | Status: AC

## 2024-01-07 NOTE — Assessment & Plan Note (Signed)
 Ordered lipid panel, pending results. Work on low cholesterol diet and exercise as tolerated

## 2024-01-07 NOTE — Assessment & Plan Note (Signed)
 Smoking cessation instruction/counseling given:  counseled patient on the dangers of tobacco use, advised patient to stop smoking, and reviewed strategies to maximize success

## 2024-01-07 NOTE — Progress Notes (Signed)
 Established Patient Office Visit  Subjective:      CC:  Chief Complaint  Patient presents with   Follow-up Thyroid     Has not done labs yet. Needs to do today.    Other Concerns    HPI: Lauren Beck is a 63 y.o. female presenting on 01/07/2024 for Follow-up Thyroid  (Has not done labs yet. Needs to do today. ) and Other Concerns . Chronic left shoulder pain: xray of shoulder last visit, she states she is scheduled to get an injection.   New complaints: She states lately she has noticed spells more so when she sits to stands up where she feels dizzy and has blurry vision. This will also make her feel nauseous. No ear fullness or pain in the ears. She does have some allergies, and has chronic sinus issues. She is curious if maybe her sugar is low?  This has been happening for a while but has become a bit worse in the recent months.   Hypothyroid: has been consistent with taking her thyroid  meds since last lab draw 4/30 where her tsh was highly elevated in the 100's. She is currently dosed at 150 mg once daily x 6 days, do not take on day 7. She has noticed thinning of hair, she was losing a lot of hair prior to surgery which has improved slightly, also with joint pains and also some fatigue.   ACDF was completed, she is post op has followed up with her surgeon. Still with  She has been unemployed for the last two years. 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 She had to leave work because she was having increasing headaches from neck pain from the repetitive work, and then she also had a lot of time invested in her health improvement as she had to start with physical therapy and f/u with neurosurgery which also kept her from being able to work full time. She still has ongoing numbness in her arms and continuing shoulder pain in the left side.   COPD: last  seen with pulmonary in Oct 22 2023, dx with COPD Gold B, she is taking trelegy 1 puff once daily. Sinus CT was ordered however this was held off due to the surgery but she plans to pursue as she does have chronic sinusitis. She did have a maxillofacial CT back in 3/1/124 with mild ethmoid maxillary and sphenoid sinusitis.  She is still smoking cigarettes has tried to quit but was unsuccessful.        Social history:  Relevant past medical, surgical, family and social history reviewed and updated as indicated. Interim medical history since our last visit reviewed.  Allergies and medications reviewed and updated.  DATA REVIEWED: CHART IN EPIC     ROS: Negative unless specifically indicated above in HPI.    Current Outpatient Medications:    acetaminophen  (TYLENOL ) 500 MG tablet, Take 1,000 mg by mouth every 6 (six) hours as needed for moderate pain (pain score 4-6)., Disp: , Rfl:    APPLE CIDER VINEGAR PO, Take 1 capsule by mouth daily., Disp: , Rfl:    atorvastatin  (LIPITOR) 10 MG tablet, Take 1 tablet (10 mg total) by mouth daily., Disp: 90 tablet, Rfl: 3   bismuth  subsalicylate (PEPTO BISMOL) 262 MG/15ML suspension, Take 15 mLs by mouth every 6 (six) hours as needed for indigestion or diarrhea or loose stools., Disp: ,  Rfl:    cetirizine  (ZYRTEC ) 10 MG tablet, Take 1 tablet (10 mg total) by mouth daily., Disp: 90 tablet, Rfl: 3   Fluticasone -Umeclidin-Vilant (TRELEGY ELLIPTA ) 100-62.5-25 MCG/ACT AEPB, Inhale 1 puff into the lungs daily., Disp: 14 each, Rfl: 0   meclizine  (ANTIVERT ) 12.5 MG tablet, Take 1/2 to one tablet po every day prn vertigo, Disp: 30 tablet, Rfl: 0   methocarbamol  (ROBAXIN ) 500 MG tablet, Take 1 tablet (500 mg total) by mouth every 6 (six) hours as needed for muscle spasms., Disp: 120 tablet, Rfl: 0   montelukast  (SINGULAIR ) 10 MG tablet, Take 10 mg by mouth at bedtime as needed (allergies)., Disp: , Rfl:    Multiple Vitamin (MULTIVITAMIN WITH MINERALS) TABS  tablet, Take 1 tablet by mouth daily., Disp: , Rfl:    Omeprazole -Sodium Bicarbonate (ZEGERID) 20-1100 MG CAPS capsule, Take 1 capsule by mouth daily as needed (heartburn)., Disp: , Rfl:    SYNTHROID  150 MCG tablet, Take one po qd for six days, do not take on day 7, Disp: 90 tablet, Rfl: 0   fluticasone  (FLONASE ) 50 MCG/ACT nasal spray, Place 2 sprays into both nostrils daily., Disp: 16 g, Rfl: 6      Objective:    Temp 97.7 F (36.5 C) (Oral)   Ht 5' 1 (1.549 m)   Wt 134 lb (60.8 kg)   LMP 04/06/2012   SpO2 97%   BMI 25.32 kg/m   Wt Readings from Last 3 Encounters:  01/07/24 134 lb (60.8 kg)  12/08/23 154 lb (69.9 kg)  11/12/23 154 lb (69.9 kg)    Physical Exam Vitals reviewed.  Constitutional:      General: She is not in acute distress.    Appearance: Normal appearance. She is normal weight. She is not ill-appearing, toxic-appearing or diaphoretic.  HENT:     Head: Normocephalic.  Cardiovascular:     Rate and Rhythm: Normal rate and regular rhythm.  Pulmonary:     Effort: Pulmonary effort is normal.  Musculoskeletal:        General: Normal range of motion.  Neurological:     General: No focal deficit present.     Mental Status: She is alert and oriented to person, place, and time. Mental status is at baseline.  Psychiatric:        Mood and Affect: Mood normal.        Behavior: Behavior normal.        Thought Content: Thought content normal.        Judgment: Judgment normal.           Assessment & Plan:  Acquired hypothyroidism Assessment & Plan: Repeat tsh today pending results.  Continue levothyroxine  x 6 days off the 7th   Orders: -     TSH -     T4, free -     T3, free  High serum vitamin B12  Tobacco abuse Assessment & Plan: Smoking cessation instruction/counseling given:  counseled patient on the dangers of tobacco use, advised patient to stop smoking, and reviewed strategies to maximize success     Mixed hyperlipidemia Assessment &  Plan: Ordered lipid panel, pending results. Work on low cholesterol diet and exercise as tolerated   Orders: -     Lipid panel  Prediabetes Assessment & Plan: Pt advised of the following: Work on a diabetic diet, try to incorporate exercise at least 20-30 a day for 3 days a week or more.    Orders: -     Comprehensive metabolic panel  with GFR -     Hemoglobin A1c  Benign paroxysmal positional vertigo due to bilateral vestibular disorder Assessment & Plan: Trial meclizine  However with chronic sinusitis so will refer to ENT for eval vertigo and chronic sinusitis   Orders: -     Ambulatory referral to ENT -     Meclizine  HCl; Take 1/2 to one tablet po every day prn vertigo  Dispense: 30 tablet; Refill: 0  COPD (chronic obstructive pulmonary disease) with chronic bronchitis (HCC) Assessment & Plan: Overall stable at current Continue trelegy  F/u with pulmonary as overdue for f/u   Chronic pansinusitis -     Ambulatory referral to ENT  Non-seasonal allergic rhinitis, unspecified trigger -     Fluticasone  Propionate; Place 2 sprays into both nostrils daily.  Dispense: 16 g; Refill: 6     Return in about 6 months (around 07/09/2024) for f/u CPE.  Ginger Patrick, MSN, APRN, FNP-C [facility]

## 2024-01-07 NOTE — Assessment & Plan Note (Signed)
 Repeat tsh today pending results.  Continue levothyroxine  x 6 days off the 7th

## 2024-01-07 NOTE — Telephone Encounter (Signed)
 Copied from CRM [id number]. Topic: Referral - Question >> Jan 07, 2024  3:05 PM Donna BRAVO wrote: Reason for CRM: Ellouise with [facility] [id number]  received a referral from [facility] FNP, referral did not have patient demographics on referral form. Tamyka's insurance is not in network [employer]  Ellouise stated up front payment is expected day of appt. Out of pocket is $516 for the vertigo and $550 for chronic pansinusitis part of the appt  Ellouise pedlar like to know if we will inform the patient of this information.  Ellouise will need the demographic sheet to inform the patient.  Please let Ellouise know what the next step will be.

## 2024-01-07 NOTE — Patient Instructions (Addendum)
  Schedule follow up with pulmonary in August 2025   A referral was placed today for ENT for your vertigo and for sinuses  Please let us  know if you have not heard back within 2 weeks about the referral.

## 2024-01-07 NOTE — Assessment & Plan Note (Signed)
 Pt advised of the following: Work on a diabetic diet, try to incorporate exercise at least 20-30 a day for 3 days a week or more.

## 2024-01-07 NOTE — Assessment & Plan Note (Signed)
 Trial meclizine  However with chronic sinusitis so will refer to ENT for eval vertigo and chronic sinusitis

## 2024-01-07 NOTE — Assessment & Plan Note (Signed)
 Overall stable at current Continue trelegy  F/u with pulmonary as overdue for f/u

## 2024-01-11 ENCOUNTER — Encounter: Payer: Self-pay | Admitting: Orthopedic Surgery

## 2024-01-11 ENCOUNTER — Ambulatory Visit: Admitting: Orthopedic Surgery

## 2024-01-11 ENCOUNTER — Ambulatory Visit: Payer: Self-pay | Admitting: Family

## 2024-01-11 DIAGNOSIS — M25512 Pain in left shoulder: Secondary | ICD-10-CM | POA: Diagnosis not present

## 2024-01-11 DIAGNOSIS — G8929 Other chronic pain: Secondary | ICD-10-CM | POA: Diagnosis not present

## 2024-01-11 DIAGNOSIS — E039 Hypothyroidism, unspecified: Secondary | ICD-10-CM

## 2024-01-11 MED ORDER — SYNTHROID 137 MCG PO TABS: 137.0000 ug | ORAL_TABLET | Freq: Every day | ORAL | 1 refills | Status: DC

## 2024-01-11 MED ORDER — METHYLPREDNISOLONE ACETATE 40 MG/ML IJ SUSP
40.0000 mg | Freq: Once | INTRAMUSCULAR | Status: AC
  Administered 2024-01-11: 40 mg via INTRAMUSCULAR

## 2024-01-11 NOTE — Progress Notes (Signed)
 New Patient Visit  Assessment: Lauren Beck is a 63 y.o. female with the following: 1. Chronic left shoulder pain  Plan: Andrya Roppolo has pain in bilateral shoulders.  Left is worse than right.  She is interested in an injection in clinic today.  Family is here with her today.    Procedure note injection Left shoulder    Verbal consent was obtained to inject the left shoulder, subacromial space Timeout was completed to confirm the site of injection.  The skin was prepped with alcohol and ethyl chloride was sprayed at the injection site.  A 21-gauge needle was used to inject 40 mg of Depo-Medrol  and 1% lidocaine  (4 cc) into the subacromial space of the left shoulder using a posterolateral approach.  There were no complications. A sterile bandage was applied.   Follow-up: Return if symptoms worsen or fail to improve.  Subjective:  Chief Complaint  Patient presents with   Shoulder Pain    Left shoulder pain wants injection     History of Present Illness: Lauren Beck is a 63 y.o. female who returns to clinic for repeat evaluation of left shoulder pain.  I saw her in clinic a couple weeks ago.  She was noted to have restricted range of motion, and pain in the left shoulder.  She is complaining of a lot of pain in the middle of her upper back, low cervical area.  She has restricted range of motion of her neck.  She is interested in a steroid injection, and presents the clinic today with family.   Objective: LMP 04/06/2012   Physical Exam:  General: Alert and oriented. and No acute distress. Gait: Normal gait.  Bilateral shoulders without deformity.  No redness.  No swelling.  No point tenderness.  Limited range of motion due to pain.  90 degrees of forward flexion.  Internal rotation to her back pocket.  Passively, her external rotation is similar between shoulders.  Fingers are warm and well-perfused.  Sensation intact throughout the bilateral hands.  IMAGING: I personally  ordered and reviewed the following images   X-rays of bilateral shoulders were completed.  These are available in clinic today.  No acute injuries.  No evidence of proximal humeral migration.  Minimal degenerative changes.   New Medications:  No orders of the defined types were placed in this encounter.     Oneil DELENA Horde, MD  01/11/2024 4:07 PM

## 2024-01-11 NOTE — Telephone Encounter (Signed)
 Referral Team,   Please refer pt to an ENT that is under her insurance.

## 2024-01-11 NOTE — Progress Notes (Signed)
 Your thyroid  is very stubborn. Now it is on the lower end instead of the higher end Let's decrease synthroid  (brand only) to 137 mcg once daily.  Make a lab only appointment in four weeks and we will repeat the thyroid  level again. Goal range 0.5 to 2 mg  On a positive note all other labs look good.

## 2024-01-11 NOTE — Patient Instructions (Signed)

## 2024-01-11 NOTE — Addendum Note (Signed)
 Addended by: RODGERS LACY on: 01/11/2024 04:11 PM   Modules accepted: Orders

## 2024-01-12 ENCOUNTER — Telehealth: Payer: Self-pay

## 2024-01-12 NOTE — Telephone Encounter (Signed)
 See result note.

## 2024-01-12 NOTE — Telephone Encounter (Signed)
 Copied from CRM [id number]. Topic: Clinical - Lab/Test Results >> Jan 12, 2024  9:39 AM Deaijah H wrote: Reason for CRM: Patient called in to return [facility] CMA call regarding lab results. Please call [phone]

## 2024-01-12 NOTE — Telephone Encounter (Signed)
 Referral sent to [facility] internal.  They will call her for scheduling    Referred To  [facility]  Otolaryngolog

## 2024-01-14 ENCOUNTER — Other Ambulatory Visit: Payer: Self-pay

## 2024-01-14 DIAGNOSIS — M5412 Radiculopathy, cervical region: Secondary | ICD-10-CM

## 2024-01-14 DIAGNOSIS — M4802 Spinal stenosis, cervical region: Secondary | ICD-10-CM

## 2024-01-15 NOTE — Progress Notes (Addendum)
   REFERRING PHYSICIAN:  Corwin Antu, Fnp [address] [place],  [place] [address]  DOS: 10/28/23 C4-7 ACDF  HISTORY OF PRESENT ILLNESS:  She was doing well at her last visit. Still had some pain with turning to right and in her left shoulder.   She continues with left shoulder pain. She sees ortho for bilateral shoulders. She had left shoulder injection on 01/11/24. She's not had any improvement yet.   She feels some occasional stiffness in her neck when turing to right. She has intermittent numbness and tingling in her left hand. Swallowing is better.   She has an attorney and is working on getting disability.    PHYSICAL EXAMINATION:  NEUROLOGICAL:  General: In no acute distress.   Awake, alert, oriented to person, place, and time.  Pupils equal round and reactive to light.  Strength: Side Biceps Triceps Deltoid Interossei Grip Wrist Ext. Wrist Flex.  R 5 5 5 5 5 5 5   L 5 5 5 5 5 5 5    Incision well healed  She has pain in shoulders with strength testing, no gross weakness.   She has limited ROM of both shoulders with pain on IR/ER of both shoulders.    Assessment / Plan: Lauren Beck is doing fair s/p above surgery.  I think her shoulder pain is shoulder mediated.   Treatment options reviewed with patient and following plan made:   - She can slowly return to activity as tolerated.  - Recommend she give shoulder injection another 7-10 days. If no improvement at that time, will contact ortho.  - She will get cervical xrays on her way out. Will call her with results.  - Given refill of robaxin . Reviewed dosing and side effects. She knows it can make her sleepy.  - Follow up with Dr. Clois in 6 months with repeat xrays.   Advised to contact the office if any questions or concerns arise.   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 Good alignment of vertebral bodies is noted. No  fracture or significant prevertebral soft tissue swelling is noted.   IMPRESSION: Postsurgical changes as noted above.     Electronically Signed   By: Lynwood Landy Raddle M.D.   On: 01/26/2024 11:31  I have personally reviewed the images and agree with the above interpretation.   Will set up phone visit to review her cervical xrays.   Glade Boys PA-C Dept of Neurosurgery

## 2024-01-19 ENCOUNTER — Ambulatory Visit
Admission: RE | Admit: 2024-01-19 | Discharge: 2024-01-19 | Disposition: A | Discharge status: Home / Self Care | Attending: Orthopedic Surgery | Admitting: Orthopedic Surgery

## 2024-01-19 ENCOUNTER — Ambulatory Visit
Admission: RE | Admit: 2024-01-19 | Discharge: 2024-01-19 | Disposition: A | Discharge status: Home / Self Care | Source: Ambulatory Visit | Attending: Orthopedic Surgery | Admitting: Orthopedic Surgery

## 2024-01-19 ENCOUNTER — Encounter: Payer: Self-pay | Admitting: Orthopedic Surgery

## 2024-01-19 ENCOUNTER — Ambulatory Visit (INDEPENDENT_AMBULATORY_CARE_PROVIDER_SITE_OTHER): Admitting: Orthopedic Surgery

## 2024-01-19 VITALS — BP 122/76 | Temp 98.3°F | Ht 61.0 in | Wt 134.0 lb

## 2024-01-19 DIAGNOSIS — M5412 Radiculopathy, cervical region: Secondary | ICD-10-CM

## 2024-01-19 DIAGNOSIS — M25511 Pain in right shoulder: Secondary | ICD-10-CM

## 2024-01-19 DIAGNOSIS — M4802 Spinal stenosis, cervical region: Secondary | ICD-10-CM | POA: Insufficient documentation

## 2024-01-19 DIAGNOSIS — Z981 Arthrodesis status: Secondary | ICD-10-CM

## 2024-01-19 MED ORDER — METHOCARBAMOL 500 MG PO TABS: 500.0000 mg | ORAL_TABLET | Freq: Three times a day (TID) | ORAL | 0 refills | Status: AC | PRN

## 2024-01-27 NOTE — Progress Notes (Unsigned)
   Telephone Visit- Progress Note: Referring Physician:  Corwin Antu, FNP [address] [place],  [place] [address]  Primary Physician:  Corwin Antu, FNP  This visit was performed via telephone.  Patient location: home Provider location: working from home  I spent a total of 5 minutes non-face-to-face activities for this visit on the date of this encounter including review of current clinical condition and response to treatment.    Patient has given verbal consent to this telephone visits and we reviewed the limitations of a telephone visit. Patient wishes to proceed.    Chief Complaint:  review cervical xrays  History of Present Illness:  DOS: 10/28/23 C4-7 ACDF  I saw her on 01/19/24 for her 3 month postop visit. She had some stiffness in her neck and intermittent numbness/tingling in left hand.   She was seeing ortho for left shoulder. Has recent injection on 01/11/24.   Phone visit scheduled to review her cervical xrays.   She has seen some improvement with her left shoulder injection. Still with some pain in her neck and shoulder. She has intermittent numbness and tingling in her left hand.     Exam: No exam done as this was a telephone encounter.     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 Good alignment of vertebral bodies is noted. No fracture or significant prevertebral soft tissue swelling is noted.   IMPRESSION: Postsurgical changes as noted above.     Electronically Signed   By: Lynwood Landy Raddle M.D.   On: 01/26/2024 11:31    I have personally reviewed the images and agree with the above interpretation.  Assessment and Plan:  Lauren Beck is doing fair s/p above surgery. Has seen some improvement in left shoulder pain after injection with ortho. Still with some neck pain.    Treatment options reviewed with patient and following plan made:    - She can slowly return to  activity as tolerated.  - Follow up with ortho as needed.  - Continue on prn robaxin . Reviewed dosing and side effects. She knows it can make her sleepy.  - Follow up with Dr. Clois as scheduled in 6 months with repeat xrays.   Advised to contact the office if any questions or concerns arise.  Glade Boys PA-C Neurosurgery

## 2024-01-29 ENCOUNTER — Ambulatory Visit: Admitting: Orthopedic Surgery

## 2024-01-29 ENCOUNTER — Encounter: Payer: Self-pay | Admitting: Orthopedic Surgery

## 2024-01-29 ENCOUNTER — Telehealth: Payer: Self-pay | Admitting: Family

## 2024-01-29 DIAGNOSIS — M542 Cervicalgia: Secondary | ICD-10-CM | POA: Diagnosis not present

## 2024-01-29 DIAGNOSIS — G8929 Other chronic pain: Secondary | ICD-10-CM

## 2024-01-29 DIAGNOSIS — Z981 Arthrodesis status: Secondary | ICD-10-CM | POA: Diagnosis not present

## 2024-01-29 DIAGNOSIS — M25512 Pain in left shoulder: Secondary | ICD-10-CM | POA: Diagnosis not present

## 2024-01-29 NOTE — Telephone Encounter (Signed)
 Spoke with [facility] and advised them that the pt's prescription has been changed and is no longer on .

## 2024-01-29 NOTE — Telephone Encounter (Signed)
 Copied from CRM [id number]. Topic: Clinical - Prescription Issue >> Jan 29, 2024  1:39 PM Robinson H wrote: Reason for CRM: Luke with [facility] calling to follow up on refill request  faxed to office for the levothyroxine  (SYNTHROID ) 150 MCG tablet medication shows discontinued by provider please reach out.  Luke Junk [phone]

## 2024-02-09 ENCOUNTER — Telehealth: Payer: Self-pay | Admitting: Family

## 2024-02-09 ENCOUNTER — Other Ambulatory Visit (INDEPENDENT_AMBULATORY_CARE_PROVIDER_SITE_OTHER)

## 2024-02-09 DIAGNOSIS — E039 Hypothyroidism, unspecified: Secondary | ICD-10-CM

## 2024-02-09 LAB — TSH: TSH: 1.05 u[IU]/mL (ref 0.35–5.50)

## 2024-02-09 NOTE — Telephone Encounter (Signed)
 Spoke with pt. She had more lab work today and wants to know if Ginger wants her to see endo based on the results.

## 2024-02-09 NOTE — Telephone Encounter (Signed)
 Pt would like a call from Tabitha's nurse assistant to discuss about refferals about her thyroid .

## 2024-02-11 ENCOUNTER — Ambulatory Visit: Payer: Self-pay | Admitting: Family

## 2024-02-11 NOTE — Telephone Encounter (Signed)
 Addressed in lab results

## 2024-02-18 ENCOUNTER — Telehealth: Payer: Self-pay

## 2024-02-18 ENCOUNTER — Other Ambulatory Visit: Payer: Self-pay | Admitting: Family

## 2024-02-18 ENCOUNTER — Telehealth: Payer: Self-pay | Admitting: Orthopedic Surgery

## 2024-02-18 DIAGNOSIS — Z981 Arthrodesis status: Secondary | ICD-10-CM

## 2024-02-18 DIAGNOSIS — G8929 Other chronic pain: Secondary | ICD-10-CM

## 2024-02-18 DIAGNOSIS — E039 Hypothyroidism, unspecified: Secondary | ICD-10-CM

## 2024-02-18 NOTE — Telephone Encounter (Signed)
 Placed order for the thyroid  u/s Please call [phone] to schedule this appointment.

## 2024-02-18 NOTE — Telephone Encounter (Signed)
 Pt is looking to try physical therapy to see if this will work better before she goes to see any other doctor & states that the injection only works temporarily. She is wanting to also get advice on where you recommend her to go.

## 2024-02-18 NOTE — Telephone Encounter (Signed)
 She was doing well the last time we talked.   Why does she want to go to PT? For neck pain? Shoulder pain? What other doctor would she go see?   Please call her and get more information.

## 2024-02-18 NOTE — Telephone Encounter (Signed)
 Copied from CRM #[id number]. Topic: Referral - Question >> Feb 18, 2024 11:54 AM Pinkey ORN wrote: Reason for CRM: Thyroid  Ultrasound >> Feb 18, 2024 11:55 AM Pinkey ORN wrote: Patient states she needs her referral for the thyroid  ultrasound to be renewed. States she let the days get by her and forgot to schedule, patient is requesting a call back / follow up. Please contact patient at [phone]

## 2024-02-18 NOTE — Telephone Encounter (Signed)
 If she wants to do PT in [place], she can do [facility] on [address].   If she wants to do PT in [place], can do [facility] or [facility] both on [address]   Let me know what she prefers.

## 2024-02-18 NOTE — Telephone Encounter (Signed)
 Spoke with patient, patient would like to try PT for her left shoulder pain and having some off and on left side neck pain. She saw Dr Onesimo on 01/11/2024 and received an injection in her shoulder that helped pain about 30%, it is getting worse again and he advised her last time the next step would be MRI scan but before proceeding with that she would like to try PT and would like to be referred by Glade to a location she thinks would be good for her. Issues with range of motion with her neck still some, when she turns her head to the right she feels like her neck tries to lock up. She is continuing to have numbness sensation in her left arm going to her left hand when she has her arm raised above her head level and it improves once she puts her hand down, some weakness in the left arm not new. She not is not dropping items and her grip strength is ok at this time. She taking Tylenol  500 mg 2 tablets once to twice daily and some days will take Robaxin  500 mg 1 tablet at bedtime when the pain in her shoulder is more intense and Tylenol  is not helping. If PT does not help than she would let Dr Chyrel know she will proceed with MRI

## 2024-02-19 NOTE — Telephone Encounter (Signed)
 Let her know I sent orders to [facility] in [place]. They should call her.

## 2024-02-19 NOTE — Telephone Encounter (Signed)
 NOTED

## 2024-02-19 NOTE — Telephone Encounter (Signed)
 Left message for pt on her VM with this information.

## 2024-02-19 NOTE — Addendum Note (Signed)
 Addended byBETHA HILMA HASTINGS on: 02/19/2024 01:29 PM   Modules accepted: Orders

## 2024-02-19 NOTE — Telephone Encounter (Unsigned)
 Copied from CRM #[id number]. Topic: General - Other >> Feb 19, 2024 11:48 AM Revonda D wrote: Reason for CRM: Pt wanted to inform Ginger Patrick, FNP, that she has the ultrasound appt scheduled on Tuesday 9/2 @10 .

## 2024-02-19 NOTE — Telephone Encounter (Signed)
 Patient advised and states she would like to try either [facility] or [facility] in [place], whichever would be best in your opinion, please and she said to tell you thank you for helping her with this.

## 2024-02-19 NOTE — Telephone Encounter (Signed)
 Patient notified and will expect a call from [facility].

## 2024-02-23 ENCOUNTER — Ambulatory Visit
Admission: RE | Admit: 2024-02-23 | Discharge: 2024-02-23 | Disposition: A | Discharge status: Home / Self Care | Source: Ambulatory Visit | Attending: Family | Admitting: Family

## 2024-02-23 DIAGNOSIS — E039 Hypothyroidism, unspecified: Secondary | ICD-10-CM | POA: Diagnosis present

## 2024-02-26 ENCOUNTER — Telehealth: Payer: Self-pay

## 2024-02-26 NOTE — Telephone Encounter (Signed)
 Mrs Brander called in with a concern of a bump at her incision. She states she tried to break it open, but nothing came out. She noticed it for about 2 weeks and it has not gone away. It is on the left side of the incision. She is concerned and is requesting an appointment for evaluation of the bump.  I discouraged her from trying to break open the bump and explained that this can lead to an infection.  She denies fever, chills, or drainage.  She is unable to send a picture. I have scheduled her an appointment for early next week for evaluation.

## 2024-02-29 ENCOUNTER — Telehealth: Payer: Self-pay | Admitting: Family

## 2024-02-29 NOTE — Telephone Encounter (Signed)
 Copied from CRM [id number]. Topic: Clinical - Lab/Test Results >> Feb 29, 2024  3:52 PM Lauren Beck wrote: Reason for CRM: Patient is calling to get her results from her thyroid  test.

## 2024-03-01 ENCOUNTER — Encounter: Payer: Self-pay | Admitting: Neurosurgery

## 2024-03-01 ENCOUNTER — Ambulatory Visit (INDEPENDENT_AMBULATORY_CARE_PROVIDER_SITE_OTHER): Admitting: Neurosurgery

## 2024-03-01 ENCOUNTER — Ambulatory Visit: Payer: Self-pay | Admitting: Family

## 2024-03-01 VITALS — BP 118/72 | Ht 61.0 in | Wt 135.5 lb

## 2024-03-01 DIAGNOSIS — Z09 Encounter for follow-up examination after completed treatment for conditions other than malignant neoplasm: Secondary | ICD-10-CM | POA: Diagnosis not present

## 2024-03-01 DIAGNOSIS — Z981 Arthrodesis status: Secondary | ICD-10-CM

## 2024-03-01 DIAGNOSIS — R9389 Abnormal findings on diagnostic imaging of other specified body structures: Secondary | ICD-10-CM

## 2024-03-01 DIAGNOSIS — T819XXD Unspecified complication of procedure, subsequent encounter: Secondary | ICD-10-CM

## 2024-03-01 DIAGNOSIS — T819XXS Unspecified complication of procedure, sequela: Secondary | ICD-10-CM

## 2024-03-01 NOTE — Progress Notes (Signed)
   REFERRING PHYSICIAN:  Corwin Antu, Fnp [address] [place],  [place] [address]  DOS: 10/28/23 C4-7 ACDF  HISTORY OF PRESENT ILLNESS: Lauren Beck is status post ACDF.    She is doing fair.  Her left shoulder still bothers her.  She is in here today to evaluate her surgical incision.  She has a bump on her wound.  She has not had any drainage.  She has no fevers or chills.  PHYSICAL EXAMINATION:  NEUROLOGICAL:  General: In no acute distress.   Awake, alert, oriented to person, place, and time.  Pupils equal round and reactive to light.  Strength: MAEW  Incision c/d/I  There is a small area of swelling on the lateral third of her incision.  There is no erythema.  It is not drained.  It does not appear irritated.  I cannot get anything to express.  Imaging:  No complications noted  Assessment / Plan: Lauren Beck is doing well after recent ACDF.   I would like to refer her to plastic surgery to evaluate her incision.  I offered her wound revision but would rather have someone with more expertise have a look at this.    Lauren Daisy MD Dept of Neurosurgery

## 2024-03-02 ENCOUNTER — Other Ambulatory Visit (INDEPENDENT_AMBULATORY_CARE_PROVIDER_SITE_OTHER)

## 2024-03-02 DIAGNOSIS — R9389 Abnormal findings on diagnostic imaging of other specified body structures: Secondary | ICD-10-CM | POA: Diagnosis not present

## 2024-03-03 ENCOUNTER — Ambulatory Visit (INDEPENDENT_AMBULATORY_CARE_PROVIDER_SITE_OTHER): Admitting: Plastic Surgery

## 2024-03-03 ENCOUNTER — Encounter: Payer: Self-pay | Admitting: Plastic Surgery

## 2024-03-03 VITALS — BP 115/73 | HR 88 | Ht 61.0 in | Wt 133.0 lb

## 2024-03-03 DIAGNOSIS — R221 Localized swelling, mass and lump, neck: Secondary | ICD-10-CM | POA: Diagnosis not present

## 2024-03-03 DIAGNOSIS — M5412 Radiculopathy, cervical region: Secondary | ICD-10-CM

## 2024-03-03 DIAGNOSIS — M4692 Unspecified inflammatory spondylopathy, cervical region: Secondary | ICD-10-CM

## 2024-03-03 DIAGNOSIS — T8189XA Other complications of procedures, not elsewhere classified, initial encounter: Secondary | ICD-10-CM | POA: Diagnosis not present

## 2024-03-03 NOTE — Progress Notes (Signed)
 Patient ID: Lauren Beck, female    DOB: [birth year], 63 y.o.   MRN: [id number]   Chief Complaint  Patient presents with   consult    The patient is a 63 year old female here for evaluation of her neck.  She underwent a cervical discectomy of C4-7 on May 7.  Since then she has been doing well and healing nicely.  She has small mass on the incision site of the lateral neck incision.  This is slightly tender.  It does not seem to be going away and may be getting slightly bigger.  No sign of infection and it does not feel like it is deep.    Review of Systems  Constitutional: Negative.   Eyes: Negative.   Respiratory: Negative.    Cardiovascular: Negative.   Gastrointestinal: Negative.   Endocrine: Negative.   Genitourinary: Negative.   Musculoskeletal: Negative.     Past Medical History:  Diagnosis Date   Allergic rhinitis    Arthritis    Bronchitis    Cervical spondylitis with radiculitis (HCC)    Cervical stenosis of spine    COPD (chronic obstructive pulmonary disease) (HCC)    Excessive or frequent menstruation    GERD (gastroesophageal reflux disease)    History of kidney stones    History of migraines    Hypothyroidism    Intermittent claudication (HCC)    Mixed hyperlipidemia    Nonspecific abnormal electrocardiogram (ECG) (EKG)    Pre-diabetes    Pure hypercholesterolemia    Tobacco use    Tobacco use disorder    Unspecified adjustment reaction     Past Surgical History:  Procedure Laterality Date   ANTERIOR CERVICAL DECOMP/DISCECTOMY FUSION N/A 10/28/2023   Procedure: ANTERIOR CERVICAL DECOMPRESSION/DISCECTOMY FUSION 3 LEVELS;  Surgeon: Clois Fret, MD;  Location: [facility] ORS;  Service: Neurosurgery;  Laterality: N/A;  C4-7 ACDF   COLONOSCOPY     ESOPHAGOGASTRODUODENOSCOPY     HAMMER TOE SURGERY Bilateral    PELVIC LAPAROSCOPY     exploratory, thought might have a cyst, not seen   TUBAL LIGATION        Current Outpatient Medications:     acetaminophen  (TYLENOL ) 500 MG tablet, Take 1,000 mg by mouth every 6 (six) hours as needed for moderate pain (pain score 4-6)., Disp: , Rfl:    APPLE CIDER VINEGAR PO, Take 1 capsule by mouth daily., Disp: , Rfl:    atorvastatin  (LIPITOR) 10 MG tablet, Take 1 tablet (10 mg total) by mouth daily., Disp: 90 tablet, Rfl: 3   bismuth  subsalicylate (PEPTO BISMOL) 262 MG/15ML suspension, Take 15 mLs by mouth every 6 (six) hours as needed for indigestion or diarrhea or loose stools., Disp: , Rfl:    cetirizine  (ZYRTEC ) 10 MG tablet, Take 1 tablet (10 mg total) by mouth daily., Disp: 90 tablet, Rfl: 3   fluticasone  (FLONASE ) 50 MCG/ACT nasal spray, Place 2 sprays into both nostrils daily., Disp: 16 g, Rfl: 6   Fluticasone -Umeclidin-Vilant (TRELEGY ELLIPTA ) 100-62.5-25 MCG/ACT AEPB, Inhale 1 puff into the lungs daily., Disp: 14 each, Rfl: 0   meclizine  (ANTIVERT ) 12.5 MG tablet, Take 1/2 to one tablet po every day prn vertigo, Disp: 30 tablet, Rfl: 0   methocarbamol  (ROBAXIN ) 500 MG tablet, Take 1 tablet (500 mg total) by mouth every 8 (eight) hours as needed for muscle spasms. Can make you sleepy., Disp: 60 tablet, Rfl: 0   montelukast  (SINGULAIR ) 10 MG tablet, Take 10 mg by mouth at bedtime as needed (allergies)., Disp: ,  Rfl:    Multiple Vitamin (MULTIVITAMIN WITH MINERALS) TABS tablet, Take 1 tablet by mouth daily., Disp: , Rfl:    Omeprazole -Sodium Bicarbonate (ZEGERID) 20-1100 MG CAPS capsule, Take 1 capsule by mouth daily as needed (heartburn)., Disp: , Rfl:    SYNTHROID  137 MCG tablet, Take 1 tablet (137 mcg total) by mouth daily before breakfast., Disp: 30 tablet, Rfl: 1   Objective:   Vitals:   03/03/24 1219  BP: 115/73  Pulse: 88  SpO2: 94%    Physical Exam Vitals reviewed.  HENT:     Head: Atraumatic.  Cardiovascular:     Rate and Rhythm: Normal rate.     Pulses: Normal pulses.  Pulmonary:     Effort: Pulmonary effort is normal.  Musculoskeletal:        General: Tenderness  present.  Skin:    Capillary Refill: Capillary refill takes less than 2 seconds.     Coloration: Skin is not jaundiced.     Findings: Lesion present. No bruising.  Neurological:     Mental Status: She is oriented to person, place, and time.  Psychiatric:        Mood and Affect: Mood normal.        Behavior: Behavior normal.        Thought Content: Thought content normal.        Judgment: Judgment normal.     Assessment & Plan:  Cervical radiculopathy  Cervical spondylitis with radiculitis (HCC)  Suture granuloma, initial encounter  Likely a stitch abscess within the incision site.  We can excise this here in the office.  I offered to do it today but the patient would like to wait until her husband can drive her.  Estefana RAMAN Jolyne Laye, DO

## 2024-03-04 LAB — THYROID PEROXIDASE ANTIBODIES (TPO) (REFL): Thyroperoxidase Ab SerPl-aCnc: 121 [IU]/mL — ABNORMAL HIGH (ref <9)

## 2024-03-05 ENCOUNTER — Ambulatory Visit (INDEPENDENT_AMBULATORY_CARE_PROVIDER_SITE_OTHER): Admitting: Audiology

## 2024-03-07 ENCOUNTER — Ambulatory Visit: Payer: Self-pay | Admitting: Family

## 2024-03-07 ENCOUNTER — Ambulatory Visit: Admitting: Plastic Surgery

## 2024-03-07 VITALS — BP 92/57 | HR 82

## 2024-03-07 DIAGNOSIS — L729 Follicular cyst of the skin and subcutaneous tissue, unspecified: Secondary | ICD-10-CM | POA: Diagnosis not present

## 2024-03-07 DIAGNOSIS — E063 Autoimmune thyroiditis: Secondary | ICD-10-CM

## 2024-03-07 DIAGNOSIS — T8189XA Other complications of procedures, not elsewhere classified, initial encounter: Secondary | ICD-10-CM

## 2024-03-07 NOTE — Progress Notes (Signed)
 Procedure Note  Preoperative Dx: skin lesion at incision site  Postoperative Dx: Same  Procedure: Excision of incision cyst 5 mm  Anesthesia: Lidocaine  1% with 1:100,000 epinephrine   Description of Procedure: Risks and complications were explained to the patient.  Consent was confirmed and the patient understands the risks and benefits.  The potential complications and alternatives were explained and the patient consents.  The patient expressed understanding the option of not having the procedure and the risks of a scar.  Time out was called and all information was confirmed to be correct.    The area was prepped and drapped.  Lidocaine  1% with epinephrine  was injected in the subcutaneous area.  After waiting several minutes for the local to take affect a #15 blade was used to excise the area in an eliptical pattern. It was a cyst.  The skin edges were reapproximated with 6-0 Monocryl.  A dressing was applied.  The patient was given instructions on how to care for the area and a follow up appointment.  [name] tolerated the procedure well and there were no complications.

## 2024-03-08 NOTE — Telephone Encounter (Signed)
 Sorry for the longevity but did you read her the info about hashimotos I attached? Wish she had mychart it'd be easier of course. It talks about causes.

## 2024-03-13 NOTE — Progress Notes (Unsigned)
 @Patient  ID: Lauren Beck, female    DOB: 1961-06-20, 63 y.o.   MRN: [id number]  No chief complaint on file.   Referring provider: Corwin Antu, FNP  HPI:  [id number] with history of AR, COPD, GERD, hypothyroid, smoking referred for COPD  Dyspnea and left upper chest pain (she thinks it is exertional). Albuterol  helps some. She's also got anoro but isn't sure if it's helpful - has been using it for about a month. Last course of prednisone  was last month. She isn't sure if it helped. She does have a cough but it's mostly dry. No BLE swelling, orthopnea.   She has never had a stress test or seen a cardiologist  She has no family history of lung disease  She works in [employer]. She is a crusher. 1ppd 30-40y smoker, currently half ppd. Vaping nicotine . No MJ. She has a dog. She is from [place].   10/22/2023 Discussed the use of AI scribe software for clinical note transcription with the patient, who gave verbal consent to proceed.  History of Present Illness   Lauren Beck is a 63 year old female with moderate COPD who presents for surgical clearance for upcoming neck surgery. She was referred by [facility] for surgical clearance.  She is scheduled for neck surgery on May 7th to address three bone spurs and disc issues. The surgery was previously postponed due to insurance requirements related to nicotine  use, despite prior clearance by Dr. Gretel.  She has moderate COPD, with a lung function test from May of last year showing 57% predicted lung function, indicating moderate obstructive lung disease. She continues to smoke but is trying to cut down. She uses a Trelegy inhaler daily and carries a rescue inhaler. Her breathing is generally controlled, but she experiences chest tightness when overheated, which improves with cooling down and hydration.  A recent chest x-ray from April showed no acute cardiopulmonary process. She has not had any recent respiratory infections requiring antibiotics or  steroids, nor any hospitalizations. She experienced COVID-19 and norovirus in the past, which she attributes to her husband's occupation as a IT sales professional.  She is preparing for her surgery by trying to settle her house, as she will be in a neck brace postoperatively.      Sinus CT unremarkable, referred to ENT  PFT 11/20/22 with moderately severe obstruction  Has cut back significantly on smoking in anticipation of upcoming neck surgery, down to 5-6 cigarettes/daily.     Assessment and Plan    Moderate COPD Moderate COPD, stage 2, FEV1 1.3l (57% predicted). No acute respiratory complaints, recent respiratory infections or hospitalizations. Chest x-ray on April 28th showed emphysema without acute cardiopulmonary process.  Using Trelegy inhaler daily. Low to moderate risk for perioperative pulmonary complications for upcoming neck surgery. Previous clearance by Dr. Benay indicated low risk for surgery, and current assessment supports proceeding with surgery from a pulmonary standpoint. - Encourage early ambulation post-surgery to prevent respiratory complications and thromboembolism. - Advise sitting up in a chair for meals and light ambulation two to three times daily post-surgery. - Ensure rescue inhaler is readily available. - Use incentive spirometry postoperatively.   Active smoking status Active smoker with efforts to reduce smoking. Smoking cessation is encouraged, especially in light of upcoming surgery. Previous attempts to quit using nicotine  replacement therapy were unsuccessful due to nicotine  content. Current surgical team is aware of smoking status and has agreed to proceed with surgery using an anterior approach. - Encourage continued efforts to reduce smoking.  03/14/2024-  INTERIM Discussed the use of AI scribe software for clinical note transcription with the patient, who gave verbal consent to proceed.  History of Present Illness   FU COPD, GERD   Allergies   Allergen Reactions   Benadryl [Diphenhydramine Hcl] Other (See Comments)    Makes patient extremely drowsy     Hydrocodone      States has headaches from such   Levaquin  [Levofloxacin ]     arthralgia    Immunization History  Administered Date(s) Administered   Influenza Split 04/24/2011   Pneumococcal Polysaccharide-23 05/11/2008   Td 06/24/1995   Tdap 10/03/2012    Past Medical History:  Diagnosis Date   Allergic rhinitis    Arthritis    Bronchitis    Cervical spondylitis with radiculitis (HCC)    Cervical stenosis of spine    COPD (chronic obstructive pulmonary disease) (HCC)    Excessive or frequent menstruation    GERD (gastroesophageal reflux disease)    History of kidney stones    History of migraines    Hypothyroidism    Intermittent claudication (HCC)    Mixed hyperlipidemia    Nonspecific abnormal electrocardiogram (ECG) (EKG)    Pre-diabetes    Pure hypercholesterolemia    Tobacco use    Tobacco use disorder    Unspecified adjustment reaction     Tobacco History: Social History   Tobacco Use  Smoking Status Every Day   Current packs/day: 1.50   Average packs/day: 1.5 packs/day for 45.7 years (68.6 ttl pk-yrs)   Types: Cigarettes   Start date: 1980  Smokeless Tobacco Never   Ready to quit: Not Answered Counseling given: Not Answered   Outpatient Medications Prior to Visit  Medication Sig Dispense Refill   acetaminophen  (TYLENOL ) 500 MG tablet Take 1,000 mg by mouth every 6 (six) hours as needed for moderate pain (pain score 4-6).     APPLE CIDER VINEGAR PO Take 1 capsule by mouth daily.     atorvastatin  (LIPITOR) 10 MG tablet Take 1 tablet (10 mg total) by mouth daily. 90 tablet 3   bismuth  subsalicylate (PEPTO BISMOL) 262 MG/15ML suspension Take 15 mLs by mouth every 6 (six) hours as needed for indigestion or diarrhea or loose stools.     cetirizine  (ZYRTEC ) 10 MG tablet Take 1 tablet (10 mg total) by mouth daily. 90 tablet 3   fluticasone   (FLONASE ) 50 MCG/ACT nasal spray Place 2 sprays into both nostrils daily. 16 g 6   Fluticasone -Umeclidin-Vilant (TRELEGY ELLIPTA ) 100-62.5-25 MCG/ACT AEPB Inhale 1 puff into the lungs daily. 14 each 0   meclizine  (ANTIVERT ) 12.5 MG tablet Take 1/2 to one tablet po every day prn vertigo 30 tablet 0   methocarbamol  (ROBAXIN ) 500 MG tablet Take 1 tablet (500 mg total) by mouth every 8 (eight) hours as needed for muscle spasms. Can make you sleepy. 60 tablet 0   montelukast  (SINGULAIR ) 10 MG tablet Take 10 mg by mouth at bedtime as needed (allergies).     Multiple Vitamin (MULTIVITAMIN WITH MINERALS) TABS tablet Take 1 tablet by mouth daily.     Omeprazole -Sodium Bicarbonate (ZEGERID) 20-1100 MG CAPS capsule Take 1 capsule by mouth daily as needed (heartburn).     SYNTHROID  137 MCG tablet Take 1 tablet (137 mcg total) by mouth daily before breakfast. 30 tablet 1   No facility-administered medications prior to visit.      Review of Systems  Review of Systems   Physical Exam  LMP 04/06/2012  Physical Exam  ***  Lab Results:  CBC    Component Value Date/Time   WBC 6.4 10/19/2023 1108   RBC 4.05 10/19/2023 1108   HGB 13.7 10/19/2023 1108   HCT 40.5 10/19/2023 1108   PLT 293.0 10/19/2023 1108   MCV 99.8 10/19/2023 1108   MCH 32.8 02/01/2022 1800   MCHC 34.0 10/19/2023 1108   RDW 13.8 10/19/2023 1108   LYMPHSABS 1.5 02/01/2022 1800   MONOABS 1.0 02/01/2022 1800   EOSABS 0.0 02/01/2022 1800   BASOSABS 0.0 02/01/2022 1800    BMET    Component Value Date/Time   NA 141 01/07/2024 0910   K 4.2 01/07/2024 0910   CL 106 01/07/2024 0910   CO2 29 01/07/2024 0910   GLUCOSE 91 01/07/2024 0910   BUN 15 01/07/2024 0910   CREATININE 0.76 01/07/2024 0910   CALCIUM  9.3 01/07/2024 0910   GFRNONAA >60 02/01/2022 1800   GFRAA 75 (L) 05/05/2014 0850    BNP    Component Value Date/Time   BNP 43.1 02/01/2022 1801    ProBNP No results found for: PROBNP  Imaging: US   THYROID  Result Date: 02/23/2024 CLINICAL DATA:  Other.  abnormal TSH EXAM: THYROID  ULTRASOUND TECHNIQUE: Ultrasound examination of the thyroid  gland and adjacent soft tissues was performed. COMPARISON:  MR C-spine, 10/27/2022. CT face 08/22/2022. CTA chest, 02/04/2022. FINDINGS: Parenchymal Echotexture: Moderately heterogenous Isthmus: 0.5 cm Right lobe: 2.8 x 0.8 x 0 8 cm Left lobe: 1.8 x 0.7 x 0.5 cm _________________________________________________________ Estimated total number of nodules >/= 1 cm: 0 Number of spongiform nodules >/=  2 cm not described below (TR1): 0 Number of mixed cystic and solid nodules >/= 1.5 cm not described below (TR2): 0 _________________________________________________________ No discrete nodules are seen within the thyroid  gland. No cervical adenopathy or abnormal fluid collection within the imaged neck. IMPRESSION: Diminutive, heterogeneous thyroid .  No discrete nodule. The above is in keeping with the ACR TI-RADS recommendations - J Am Coll Radiol 2017;14:587-595. Electronically Signed   By: Thom Hall M.D.   On: 02/23/2024 13:34     Assessment & Plan:   No problem-specific Assessment & Plan notes found for this encounter.   1. COPD (chronic obstructive pulmonary disease) with chronic bronchitis (HCC) (Primary)   Assessment and Plan Assessment & Plan        Almarie LELON Ferrari, NP 03/13/2024

## 2024-03-14 ENCOUNTER — Ambulatory Visit: Admitting: Primary Care

## 2024-03-14 ENCOUNTER — Encounter: Payer: Self-pay | Admitting: Primary Care

## 2024-03-14 VITALS — BP 120/64 | HR 84 | Temp 97.3°F | Ht 61.0 in | Wt 132.4 lb

## 2024-03-14 DIAGNOSIS — Z9889 Other specified postprocedural states: Secondary | ICD-10-CM

## 2024-03-14 DIAGNOSIS — J4489 Other specified chronic obstructive pulmonary disease: Secondary | ICD-10-CM

## 2024-03-14 DIAGNOSIS — R634 Abnormal weight loss: Secondary | ICD-10-CM | POA: Diagnosis not present

## 2024-03-14 DIAGNOSIS — E039 Hypothyroidism, unspecified: Secondary | ICD-10-CM

## 2024-03-14 DIAGNOSIS — F17200 Nicotine dependence, unspecified, uncomplicated: Secondary | ICD-10-CM

## 2024-03-14 DIAGNOSIS — R058 Other specified cough: Secondary | ICD-10-CM | POA: Diagnosis not present

## 2024-03-14 DIAGNOSIS — J439 Emphysema, unspecified: Secondary | ICD-10-CM

## 2024-03-14 DIAGNOSIS — F17210 Nicotine dependence, cigarettes, uncomplicated: Secondary | ICD-10-CM

## 2024-03-14 MED ORDER — BENZONATATE 100 MG PO CAPS: 100.0000 mg | ORAL_CAPSULE | Freq: Two times a day (BID) | ORAL | 1 refills | Status: DC | PRN

## 2024-03-14 MED ORDER — MONTELUKAST SODIUM 10 MG PO TABS: 10.0000 mg | ORAL_TABLET | Freq: Every day | ORAL | 3 refills | Status: AC

## 2024-03-14 NOTE — Patient Instructions (Addendum)
  VISIT SUMMARY: During your visit, we discussed your persistent dry cough, COPD management, hypothyroidism, unintentional weight loss, chronic sinusitis, and post-surgical symptoms. We reviewed your current medications and lifestyle habits, including smoking cessation efforts.  YOUR PLAN: -CHRONIC OBSTRUCTIVE PULMONARY DISEASE (COPD) WITH EMPHYSEMA: COPD with emphysema is a lung condition caused by smoking that damages the air sacs in your lungs, making it hard to breathe. It's important to stop smoking to prevent further lung damage. We recommend reading Dasie Ruth book 'Easy Ways to Stop Smoking' and considering the audio version. You should also retry nicotine  patches or chantix. Additionally, you should enroll in a lung cancer screening program with an annual CT scan.  -DRY COUGH: Your persistent dry cough may be related to chronic sinusitis or postnasal drip. We recommend restarting montelukast  for your allergy-related symptoms. You can also consider natural remedies like elderberry and honey, but make sure they don't interact with your current medications.  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 We recommend discussing your thyroid  management with your primary care provider.  -UNINTENTIONAL WEIGHT LOSS AND POOR APPETITE: Your weight loss from 146 lbs to 132 lbs and decreased appetite may be due to hypothyroidism, mood changes, or inactivity. To prevent further weight loss, we recommend supplementing your diet with protein shakes like Boost, Fairlife or Ensure.  -CHRONIC SINUSITIS: Chronic sinusitis can cause symptoms like postnasal drip, which may contribute to your dry cough. Continue managing your allergies with montelukast  and cetirizine .  -STATUS POST ANTERIOR CERVICAL NECK SURGERY WITH LEFT-SIDED SYMPTOMS: After your neck surgery, you developed left-sided symptoms like weakness and  numbness. Continue with physical therapy to address these symptoms.  INSTRUCTIONS: Please follow up with your primary care provider to discuss your thyroid  management. Enroll in a lung cancer screening program for annual CT scans. Continue with physical therapy for your left-sided symptoms. Restart montelukast  for your allergy-related symptoms and consider natural remedies for your cough, ensuring no interactions with your current medications.  Refer: Lung cancer screening   Follow-up 6 months with Beth NP or sooner if needed

## 2024-03-21 ENCOUNTER — Encounter: Payer: Self-pay | Admitting: Student

## 2024-03-21 ENCOUNTER — Telehealth: Payer: Self-pay | Admitting: Acute Care

## 2024-03-21 ENCOUNTER — Ambulatory Visit: Admitting: Student

## 2024-03-21 VITALS — BP 107/67 | HR 113 | Ht 61.0 in | Wt 132.4 lb

## 2024-03-21 DIAGNOSIS — F17200 Nicotine dependence, unspecified, uncomplicated: Secondary | ICD-10-CM

## 2024-03-21 DIAGNOSIS — T8189XA Other complications of procedures, not elsewhere classified, initial encounter: Secondary | ICD-10-CM

## 2024-03-21 DIAGNOSIS — Z9889 Other specified postprocedural states: Secondary | ICD-10-CM

## 2024-03-21 DIAGNOSIS — Z122 Encounter for screening for malignant neoplasm of respiratory organs: Secondary | ICD-10-CM

## 2024-03-21 DIAGNOSIS — Z87891 Personal history of nicotine dependence: Secondary | ICD-10-CM

## 2024-03-21 NOTE — Telephone Encounter (Signed)
 Lung Cancer Screening Narrative/Criteria Questionnaire (Cigarette Smokers Only- No Cigars/Pipes/vapes)   Lauren Beck   SDMV:03/30/24 at 10:15am/Katy                                           September 21, 1960              LDCT: 04/04/24 at 12 noon/DWB    63 y.o.   Phone: [phone]  Lung Screening Narrative (confirm age 30-77 yrs Medicare / 50-80 yrs Private pay insurance)   Insurance information:[employer]   Referring Provider:W[facility]h   This screening involves an initial phone call with a team member from our program. It is called a shared decision making visit. The initial meeting is required by insurance and Medicare to make sure you understand the program. This appointment takes about 15-20 minutes to complete. The CT scan will completed at a separate date/time. This scan takes about 5-10 minutes to complete and you may eat and drink before and after the scan.  Criteria questions for Lung Cancer Screening:   Are you a current or former smoker? Current Age began smoking: 17y   If you are a former smoker, what year did you quit smoking? NA   To calculate your smoking history, I need an accurate estimate of how many packs of cigarettes you smoked per day and for how many years. (Not just the number of PPD you are now smoking)   Years smoking 46 x Packs per day 1/2 to 1 = Pack years 35   (at least 20 pack yrs)   (Make sure they understand that we need to know how much they have smoked in the past, not just the number of PPD they are smoking now)  Do you have a personal history of cancer?  No    Do you have a family history of cancer? No  Are you coughing up blood?  No  Have you had unexplained weight loss of 15 lbs or more in the last 6 months? No  It looks like you meet all criteria.     Additional information: N/A

## 2024-03-21 NOTE — Progress Notes (Signed)
 Patient is a 63 year old female who recently underwent excision of a cyst to her left neck with Dr. Lowery on 03/07/2024.  Per procedure note, it was noted that upon excision of the lesion, it was noted to be a cyst.  Specimen was not sent to pathology.  Patient is 2 weeks out from her procedure.  She presents to the clinic today for postprocedural follow-up.  Today, patient reports she is doing well.  She denies any issues or concerns with her procedure site.  She denies any fevers or chills.  She denies any pain to the area.  On exam, patient sitting upright in no acute distress.  Steri-Strip is in place over the procedure site.  This was removed.  Incision intact with Monocryl sutures.  There is no surrounding erythema or drainage.  There is a little bit of firmness underneath the incision, consistent with scar tissue.  There is no drainage, there are no signs of infection on exam.  Monocryl sutures were removed without any difficulty.  Patient tolerated well.  Recommended that patient apply Vaseline to her incision daily for the next week or so and gently massage her incision.  I discussed with her that after about a week or so if she would like to transition to scar creams, she may.  Patient expressed understanding.  Discussed with the patient that she should avoid direct sunlight to the incision as this can worsen the scar.  Patient expressed understanding.  Patient to follow back up as needed.  I instructed her to call back if she has any questions or concerns about anything.

## 2024-03-22 ENCOUNTER — Other Ambulatory Visit: Payer: Self-pay | Admitting: Family

## 2024-03-22 ENCOUNTER — Telehealth: Payer: Self-pay | Admitting: Family

## 2024-03-22 DIAGNOSIS — E039 Hypothyroidism, unspecified: Secondary | ICD-10-CM

## 2024-03-22 NOTE — Telephone Encounter (Signed)
 Copied from CRM #[id number]. Topic: General - Other >> Mar 22, 2024  4:17 PM Alfonso ORN wrote: Reason for CRM: Patient also wanted to let Tabitha know that she has begun therapy. [facility] [address] [place] p#[phone]

## 2024-03-22 NOTE — Telephone Encounter (Unsigned)
 Copied from CRM #[id number]. Topic: Clinical - Medication Refill >> Mar 22, 2024  4:09 PM Alfonso ORN wrote: Medication:  SYNTHROID  137 MCG tablet   Has the patient contacted their pharmacy? No   This is the patient's preferred pharmacy:  [id number] - [place], [place] - [address] AT [facility] OF [address] & [address] [place] [address] Phone: [phone] Fax: [phone]   Is this the correct pharmacy for this prescription? Yes If no, delete pharmacy and type the correct one.   Has the prescription been filled recently? No  Is the patient out of the medication? No  Has the patient been seen for an appointment in the last year OR does the patient have an upcoming appointment? Yes  Can we respond through MyChart? No   Patient also wants to be sure that she needs to continue same medication as previously prescribed as she has lost weight

## 2024-03-22 NOTE — Telephone Encounter (Signed)
 NOTED

## 2024-03-24 MED ORDER — SYNTHROID 137 MCG PO TABS: 137.0000 ug | ORAL_TABLET | Freq: Every day | ORAL | 1 refills | Status: DC

## 2024-03-29 ENCOUNTER — Ambulatory Visit: Admitting: Gastroenterology

## 2024-03-29 NOTE — Progress Notes (Unsigned)
  Virtual Visit via Telephone Note  I connected with Lauren Beck , 03/30/24 10:05 AM by a telemedicine application and verified that I am speaking with the correct person using two identifiers.  Location: Patient: home Provider: home   I discussed the limitations of evaluation and management by telemedicine and the availability of in person appointments. The patient expressed understanding and agreed to proceed.   Shared Decision Making Visit Lung Cancer Screening Program [phone])   Eligibility: 63 y.o. Pack Years Smoking History Calculation = 35 pack years  (# packs/per year x # years smoked) Recent History of coughing up blood  no Unexplained weight loss? no ( >Than 15 pounds within the last 6 months ) Prior History Lung / other cancer no (Diagnosis within the last 5 years already requiring surveillance chest CT Scans). Smoking Status Current Smoker   Visit Components: Discussion included one or more decision making aids. YES Discussion included risk/benefits of screening. YES Discussion included potential follow up diagnostic testing for abnormal scans. YES Discussion included meaning and risk of over diagnosis. YES Discussion included meaning and risk of False Positives. YES Discussion included meaning of total radiation exposure. YES  Counseling Included: Importance of adherence to annual lung cancer LDCT screening. YES Impact of comorbidities on ability to participate in the program. YES Ability and willingness to under diagnostic treatment. YES  Smoking Cessation Counseling: Current Smokers:  Discussed importance of smoking cessation. yes Information about tobacco cessation classes and interventions provided to patient. yes Patient provided with ticket for LDCT Scan. yes Symptomatic Patient. NO Diagnosis Code: Tobacco Use Z72.0 Asymptomatic Patient yes  Counseling - 4 minutes of smoking cessation counseling (CT Chest Lung Cancer Screening Low Dose W/O CM)  [id number]  Z12.2-Screening of respiratory organs Z87.891-Personal history of nicotine  dependence   [name] 03/30/24

## 2024-03-29 NOTE — Progress Notes (Deleted)
  GI Progress Note  Chief Complaint: ***  Subjective  Prior history  I saw her in office consultation July 2023 for years of heartburn, dysphagia and dyspepsia.  Clinical details outlined in that office consult note.  Ongoing smoking with recurrent bronchitis.  Ultrasound negative for gallstones. Endoscopic procedures 02/14/2022: Colonoscopy complete to cecum, preparation poor (GoLytely  prep).  Plan was for repeat colonoscopy in a year with 2 successive prep days of Suprep followed by GoLytely . Upper endoscopy with a 4 to 5 cm hiatal hernia resulting in distal esophageal tortuosity.  There was a mild Schatzki ring disrupted with biopsy forceps (endoscopic dilation would have been challenging since patient had significant coughing and gagging during procedure).  Mild diffuse nonspecific gastritis,, biopsies negative for H. pylori.  Patient strongly advised to continue acid suppression as well as nonpharmacologic antireflux measures, and also strongly advised to quit smoking.   Similar symptoms when last seen June 2024.  Was hypothyroid.   Changed zegerid to BID Protonix .   Needed repeat colon with 2 day prep after her cervical spine surgery   Discussed the use of AI scribe software for clinical note transcription with the patient, who gave verbal consent to proceed.  History of Present Illness     ROS: Cardiovascular:  no chest pain Respiratory: no dyspnea  The patient's Past Medical, Family and Social History were reviewed and are on file in the EMR. Past Medical History:  Diagnosis Date   Allergic rhinitis    Arthritis    Bronchitis    Cervical spondylitis with radiculitis    Cervical stenosis of spine    COPD (chronic obstructive pulmonary disease) (HCC)    Excessive or frequent menstruation    GERD (gastroesophageal reflux disease)    History of kidney stones    History of migraines    Hypothyroidism    Intermittent claudication    Mixed hyperlipidemia     Nonspecific abnormal electrocardiogram (ECG) (EKG)    Pre-diabetes    Pure hypercholesterolemia    Tobacco use    Tobacco use disorder    Unspecified adjustment reaction     Past Surgical History:  Procedure Laterality Date   ANTERIOR CERVICAL DECOMP/DISCECTOMY FUSION N/A 10/28/2023   Procedure: ANTERIOR CERVICAL DECOMPRESSION/DISCECTOMY FUSION 3 LEVELS;  Surgeon: Clois Fret, MD;  Location: [facility] ORS;  Service: Neurosurgery;  Laterality: N/A;  C4-7 ACDF   COLONOSCOPY     ESOPHAGOGASTRODUODENOSCOPY     HAMMER TOE SURGERY Bilateral    PELVIC LAPAROSCOPY     exploratory, thought might have a cyst, not seen   TUBAL LIGATION       Objective:  Med list reviewed  Current Outpatient Medications:    acetaminophen  (TYLENOL ) 500 MG tablet, Take 1,000 mg by mouth every 6 (six) hours as needed for moderate pain (pain score 4-6)., Disp: , Rfl:    APPLE CIDER VINEGAR PO, Take 1 capsule by mouth daily., Disp: , Rfl:    atorvastatin  (LIPITOR) 10 MG tablet, Take 1 tablet (10 mg total) by mouth daily., Disp: 90 tablet, Rfl: 3   bismuth  subsalicylate (PEPTO BISMOL) 262 MG/15ML suspension, Take 15 mLs by mouth every 6 (six) hours as needed for indigestion or diarrhea or loose stools., Disp: , Rfl:    cetirizine  (ZYRTEC ) 10 MG tablet, Take 1 tablet (10 mg total) by mouth daily., Disp: 90 tablet, Rfl: 3   fluticasone  (FLONASE ) 50 MCG/ACT nasal spray, Place 2 sprays into both nostrils daily., Disp: 16 g, Rfl: 6  Fluticasone -Umeclidin-Vilant (TRELEGY ELLIPTA ) 100-62.5-25 MCG/ACT AEPB, Inhale 1 puff into the lungs daily., Disp: 14 each, Rfl: 0   meclizine  (ANTIVERT ) 12.5 MG tablet, Take 1/2 to one tablet po every day prn vertigo, Disp: 30 tablet, Rfl: 0   methocarbamol  (ROBAXIN ) 500 MG tablet, Take 1 tablet (500 mg total) by mouth every 8 (eight) hours as needed for muscle spasms. Can make you sleepy., Disp: 60 tablet, Rfl: 0   montelukast  (SINGULAIR ) 10 MG tablet, Take 1 tablet (10 mg total) by mouth  at bedtime., Disp: 90 tablet, Rfl: 3   Multiple Vitamin (MULTIVITAMIN WITH MINERALS) TABS tablet, Take 1 tablet by mouth daily., Disp: , Rfl:    Omeprazole -Sodium Bicarbonate (ZEGERID) 20-1100 MG CAPS capsule, Take 1 capsule by mouth daily as needed (heartburn)., Disp: , Rfl:    SYNTHROID  137 MCG tablet, Take 1 tablet (137 mcg total) by mouth daily before breakfast., Disp: 90 tablet, Rfl: 1   Vital signs in last 24 hrs: There were no vitals filed for this visit. Wt Readings from Last 3 Encounters:  03/21/24 132 lb 6.4 oz (60.1 kg)  03/14/24 132 lb 6.4 oz (60.1 kg)  03/03/24 133 lb (60.3 kg)    Physical Exam  *** HEENT: sclera anicteric, oral mucosa moist without lesions Neck: supple, no thyromegaly, JVD or lymphadenopathy Cardiac: ***,  no peripheral edema Pulm: clear to auscultation bilaterally, normal RR and effort noted Abdomen: soft, *** tenderness, with active bowel sounds. No guarding or palpable hepatosplenomegaly. Skin; warm and dry, no jaundice or rash   Labs:  Ongoing difficulties regulating her thyroid  function since I last saw her, though most recent TSH normal August 2025 ___________________________________________ Radiologic studies:   ____________________________________________ Other:   _____________________________________________   No diagnosis found.  Assessment and Plan Assessment & Plan      Plan:   *** minutes were spent on this encounter (including chart review, history/exam, counseling/coordination of care, and documentation) > 50% of that time was spent on counseling and coordination of care.   Lauren Beck

## 2024-03-30 ENCOUNTER — Encounter: Payer: Self-pay | Admitting: Adult Health

## 2024-03-30 ENCOUNTER — Ambulatory Visit: Admitting: Adult Health

## 2024-03-30 DIAGNOSIS — F17210 Nicotine dependence, cigarettes, uncomplicated: Secondary | ICD-10-CM

## 2024-03-30 NOTE — Patient Instructions (Signed)

## 2024-04-04 ENCOUNTER — Ambulatory Visit (HOSPITAL_BASED_OUTPATIENT_CLINIC_OR_DEPARTMENT_OTHER)
Admission: RE | Admit: 2024-04-04 | Discharge: 2024-04-04 | Disposition: A | Discharge status: Home / Self Care | Source: Ambulatory Visit | Attending: Acute Care | Admitting: Acute Care

## 2024-04-04 DIAGNOSIS — F17200 Nicotine dependence, unspecified, uncomplicated: Secondary | ICD-10-CM | POA: Insufficient documentation

## 2024-04-04 DIAGNOSIS — Z122 Encounter for screening for malignant neoplasm of respiratory organs: Secondary | ICD-10-CM | POA: Diagnosis present

## 2024-04-04 DIAGNOSIS — Z87891 Personal history of nicotine dependence: Secondary | ICD-10-CM | POA: Insufficient documentation

## 2024-04-06 ENCOUNTER — Other Ambulatory Visit: Payer: Self-pay

## 2024-04-06 DIAGNOSIS — F17210 Nicotine dependence, cigarettes, uncomplicated: Secondary | ICD-10-CM

## 2024-04-06 DIAGNOSIS — Z122 Encounter for screening for malignant neoplasm of respiratory organs: Secondary | ICD-10-CM

## 2024-04-06 DIAGNOSIS — Z87891 Personal history of nicotine dependence: Secondary | ICD-10-CM

## 2024-04-08 ENCOUNTER — Telehealth: Payer: Self-pay

## 2024-04-08 DIAGNOSIS — M5412 Radiculopathy, cervical region: Secondary | ICD-10-CM

## 2024-04-08 DIAGNOSIS — G8929 Other chronic pain: Secondary | ICD-10-CM

## 2024-04-08 DIAGNOSIS — Z981 Arthrodesis status: Secondary | ICD-10-CM

## 2024-04-08 DIAGNOSIS — M4802 Spinal stenosis, cervical region: Secondary | ICD-10-CM

## 2024-04-08 NOTE — Telephone Encounter (Signed)
 Pt came in and stated that she talked to Dr.Y about her social security case & trying to get approval, and he told her about trying a 1 hour test. Wondering if you can assist in finding out or if you spoke with him about this? I do not see any notes on this.

## 2024-04-08 NOTE — Telephone Encounter (Signed)
 I spoke with the patient. She reports she is trying to get social security disability and she and Dr Clois discussed a test that she would need to do before he would be willing to sign anything for disability. She is aware he does not do long term disability. However, she states he discussed a test that she would need to do before he would sign anything and she would like to proceed with that test.  She is currently doing physical therapy with [facility].

## 2024-04-12 NOTE — Telephone Encounter (Signed)
 I spoke with the patient. Order has been placed for FCE.  Ritta, please see if [facility] on [address] in [place] does these. She says she sees [facility]. Ph# [phone], Fax: [phone].  If not, please notify the patient and notify her which places do FCE's and send to the place of her choosing. [facility] is her first choice if it is an option. Please let her know the outcome.  Thanks!

## 2024-04-12 NOTE — Telephone Encounter (Signed)
 Spoke with patient to inform her that [facility] does not do FCE testing. I gave the patient some other options and their price ranges. Patient would like to get back to me after she discusses the finances with her husband and lawyer.

## 2024-04-14 ENCOUNTER — Encounter: Admitting: Orthopedic Surgery

## 2024-04-15 ENCOUNTER — Ambulatory Visit (INDEPENDENT_AMBULATORY_CARE_PROVIDER_SITE_OTHER): Admitting: Physician Assistant

## 2024-04-15 ENCOUNTER — Ambulatory Visit (INDEPENDENT_AMBULATORY_CARE_PROVIDER_SITE_OTHER)

## 2024-04-15 DIAGNOSIS — M65939 Unspecified synovitis and tenosynovitis, unspecified forearm: Secondary | ICD-10-CM

## 2024-04-15 DIAGNOSIS — M19031 Primary osteoarthritis, right wrist: Secondary | ICD-10-CM

## 2024-04-15 DIAGNOSIS — M25531 Pain in right wrist: Secondary | ICD-10-CM

## 2024-04-15 DIAGNOSIS — M79641 Pain in right hand: Secondary | ICD-10-CM | POA: Diagnosis not present

## 2024-04-15 MED ORDER — MELOXICAM 15 MG PO TABS
15.0000 mg | ORAL_TABLET | Freq: Every day | ORAL | 0 refills | Status: AC
Start: 2024-04-15 — End: ?

## 2024-04-15 NOTE — Patient Instructions (Signed)
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
## 2024-05-05 ENCOUNTER — Ambulatory Visit (INDEPENDENT_AMBULATORY_CARE_PROVIDER_SITE_OTHER): Admitting: Otolaryngology

## 2024-05-05 ENCOUNTER — Encounter (INDEPENDENT_AMBULATORY_CARE_PROVIDER_SITE_OTHER): Payer: Self-pay | Admitting: Otolaryngology

## 2024-05-05 ENCOUNTER — Ambulatory Visit (INDEPENDENT_AMBULATORY_CARE_PROVIDER_SITE_OTHER): Admitting: Audiology

## 2024-05-05 VITALS — BP 89/62 | HR 87 | Ht 61.0 in | Wt 133.0 lb

## 2024-05-05 DIAGNOSIS — H90A31 Mixed conductive and sensorineural hearing loss, unilateral, right ear with restricted hearing on the contralateral side: Secondary | ICD-10-CM

## 2024-05-05 DIAGNOSIS — R0981 Nasal congestion: Secondary | ICD-10-CM

## 2024-05-05 DIAGNOSIS — J343 Hypertrophy of nasal turbinates: Secondary | ICD-10-CM

## 2024-05-05 DIAGNOSIS — J3489 Other specified disorders of nose and nasal sinuses: Secondary | ICD-10-CM

## 2024-05-05 DIAGNOSIS — J328 Other chronic sinusitis: Secondary | ICD-10-CM

## 2024-05-05 DIAGNOSIS — F17200 Nicotine dependence, unspecified, uncomplicated: Secondary | ICD-10-CM

## 2024-05-05 DIAGNOSIS — J342 Deviated nasal septum: Secondary | ICD-10-CM | POA: Diagnosis not present

## 2024-05-05 DIAGNOSIS — H938X1 Other specified disorders of right ear: Secondary | ICD-10-CM

## 2024-05-05 DIAGNOSIS — R0982 Postnasal drip: Secondary | ICD-10-CM

## 2024-05-05 MED ORDER — AZELASTINE HCL 0.1 % NA SOLN: 2.0000 | Freq: Two times a day (BID) | NASAL | 12 refills | Status: AC

## 2024-05-05 MED ORDER — PREDNISONE 20 MG PO TABS: 20.0000 mg | ORAL_TABLET | Freq: Every day | ORAL | 0 refills | Status: DC

## 2024-05-05 MED ORDER — AMOXICILLIN-POT CLAVULANATE 875-125 MG PO TABS: 1.0000 | ORAL_TABLET | Freq: Two times a day (BID) | ORAL | 0 refills | Status: DC

## 2024-05-05 NOTE — Progress Notes (Signed)
 Dear Dr. Corwin, Here is my assessment for our mutual patient, Lauren Beck. Thank you for allowing me the opportunity to care for your patient. Please do not hesitate to contact me should you have any other questions. Sincerely, Dr. Eldora Blanch  Otolaryngology Clinic Note  HISTORY:  Initial visit (04/2024): Discussed the use of AI scribe software for clinical note transcription with the patient, who gave verbal consent to proceed.  History of Present Illness Lauren Beck is a 63 year old female with emphysema and COPD who presents with seasonal sinus congestion and ear problems.  She experiences bilateral nasal congestion and sinonasal pressure primarily from October to February, with symptoms including a runny nose, sinus pressure in the cheeks and less so forehead. Congestion alternates. Coughing and sometimes discolored nasal drainage occur, though it is usually not green or yellow. No hyposmia. She has tried Flonase , Astelin spray, saline rinses, and allergy pills like Allegra and Claritin , but this is inconsistent, with inconsistent effectiveness.  She has a history of smoking and has been diagnosed with emphysema and COPD.  She has tried flonase  (intermittently) and astelin (prior). Prior nasal rinses. Tried PO anthistamine. Improvement occurred with nothing.  Additional evaluation has included CT (2024).  Allergy testing has not been done. Does get allergy symptoms (once in a great blue moon). No previous sinonasal surgery.  She is currently using PO antihistamine. Intermittent flonase .   Ear wise: mostly has a problem where she feels stopped up, under water. Does have intermittent tinnitus. Does have popping/crackling. Can't pop ears. Ear issues, however, occur all the time. Does have hearing loss. Mostly on right. Denies vertigo. Patient denies: ear pain, drainage. Patient also denies barotrauma, vestibular suppressant use, ototoxic medication use Prior ear surgery:  no   AP/AC: no Tobacco: yes, current  PMHx: COPD, AR, GERD, Hypothyroidism, Cervical Stenosis, Migraines  RADIOGRAPHIC EVALUATION AND INDEPENDENT REVIEW OF OTHER RECORDS:: Labs CMP and TSH (01/07/2024) and CBC (09/2023): TSH wnl, BUN/Cr 15/0.76, WBC 6.0, Eos 0 CT Face 08/22/2022 independently interpreted with respect to ears and sinuses: noted left septal deviation, L>R max with inferior MPT likely mucoid retention cyst, mild b/l ethmoid opacification, sphenoid sinuses relatively clear with hypoplastic frontal sinus; right mastoid opacification but ME well aerated, no noted ossicular chain disruption/pathology or otic capsule; no obvious evidence of tegmen dehiscence/encephalocele [name] (01/07/2024): more speels when she sits to stand up with dizzy/blurry vision; no ear fullness or pain; chronic sinus issue; Dx: Chronic sinus issues, BPPV; Rx: nef to ENT, meclizine  PRN 04/2024 Audiogram was independently reviewed and interpreted by me and it reveals -- AD/AS: C/A tymp;  Right ear- Normal to moderate mixed hearing loss from 250 Hz - 8000 Hz. Left ear-  Normal to moderately severe sensorineural hearing loss from 250 Hz - 8000 Hz. Right ear: 100% was obtained at a presentation level of 70 dBHL with contralateral masking which is deemed as  excellent. Left ear: 100% was obtained at a presentation level of 65 dBHL with contralateral masking which is deemed as  excellent.   SNHL= Sensorineural hearing loss  Past Medical History:  Diagnosis Date   Allergic rhinitis    Arthritis    Bronchitis    Cervical spondylitis with radiculitis    Cervical stenosis of spine    COPD (chronic obstructive pulmonary disease) (HCC)    Excessive or frequent menstruation    GERD (gastroesophageal reflux disease)    History of kidney stones    History of migraines    Hypothyroidism  Intermittent claudication    Mixed hyperlipidemia    Nonspecific abnormal electrocardiogram (ECG) (EKG)    Pre-diabetes     Pure hypercholesterolemia    Tobacco use    Tobacco use disorder    Unspecified adjustment reaction    Past Surgical History:  Procedure Laterality Date   ANTERIOR CERVICAL DECOMP/DISCECTOMY FUSION N/A 10/28/2023   Procedure: ANTERIOR CERVICAL DECOMPRESSION/DISCECTOMY FUSION 3 LEVELS;  Surgeon: Clois Fret, MD;  Location: [facility] ORS;  Service: Neurosurgery;  Laterality: N/A;  C4-7 ACDF   COLONOSCOPY     ESOPHAGOGASTRODUODENOSCOPY     HAMMER TOE SURGERY Bilateral    PELVIC LAPAROSCOPY     exploratory, thought might have a cyst, not seen   TUBAL LIGATION     Family History  Problem Relation Age of Onset   Other Father        No contact--unknown   Breast cancer Sister    Thyroid  disease Daughter    Anxiety disorder Daughter    OCD Daughter    Rectal cancer Neg Hx    Esophageal cancer Neg Hx    Stomach cancer Neg Hx    Colon cancer Neg Hx    Social History   Tobacco Use   Smoking status: Every Day    Current packs/day: 1.50    Average packs/day: 1.5 packs/day for 45.9 years (68.8 ttl pk-yrs)    Types: Cigarettes    Start date: 1980   Smokeless tobacco: Never   Tobacco comments:    Pt states she smokes 0.5- 1 ppd due to boredom. AB, CMA 03-14-24  Substance Use Topics   Alcohol use: Yes    Alcohol/week: 1.0 standard drink of alcohol    Types: 1 Glasses of wine per week    Comment: occasional wine   Allergies  Allergen Reactions   Benadryl [Diphenhydramine Hcl] Other (See Comments)    Makes patient extremely drowsy     Hydrocodone      States has headaches from such   Levaquin  [Levofloxacin ]     arthralgia   Current Outpatient Medications  Medication Sig Dispense Refill   acetaminophen  (TYLENOL ) 500 MG tablet Take 1,000 mg by mouth every 6 (six) hours as needed for moderate pain (pain score 4-6).     amoxicillin -clavulanate (AUGMENTIN ) 875-125 MG tablet Take 1 tablet by mouth 2 (two) times daily. 20 tablet 0   APPLE CIDER VINEGAR PO Take 1 capsule by mouth  daily.     atorvastatin  (LIPITOR) 10 MG tablet Take 1 tablet (10 mg total) by mouth daily. 90 tablet 3   azelastine (ASTELIN) 0.1 % nasal spray Place 2 sprays into both nostrils 2 (two) times daily. Use in each nostril as directed 30 mL 12   bismuth  subsalicylate (PEPTO BISMOL) 262 MG/15ML suspension Take 15 mLs by mouth every 6 (six) hours as needed for indigestion or diarrhea or loose stools.     cetirizine  (ZYRTEC ) 10 MG tablet Take 1 tablet (10 mg total) by mouth daily. 90 tablet 3   fluticasone  (FLONASE ) 50 MCG/ACT nasal spray Place 2 sprays into both nostrils daily. 16 g 6   Fluticasone -Umeclidin-Vilant (TRELEGY ELLIPTA ) 100-62.5-25 MCG/ACT AEPB Inhale 1 puff into the lungs daily. 14 each 0   meclizine  (ANTIVERT ) 12.5 MG tablet Take 1/2 to one tablet po every day prn vertigo 30 tablet 0   meloxicam  (MOBIC ) 15 MG tablet Take 1 tablet (15 mg total) by mouth daily. 14 tablet 0   methocarbamol  (ROBAXIN ) 500 MG tablet Take 1 tablet (500 mg total) by  mouth every 8 (eight) hours as needed for muscle spasms. Can make you sleepy. 60 tablet 0   montelukast  (SINGULAIR ) 10 MG tablet Take 1 tablet (10 mg total) by mouth at bedtime. 90 tablet 3   Multiple Vitamin (MULTIVITAMIN WITH MINERALS) TABS tablet Take 1 tablet by mouth daily.     Omeprazole -Sodium Bicarbonate (ZEGERID) 20-1100 MG CAPS capsule Take 1 capsule by mouth daily as needed (heartburn).     predniSONE  (DELTASONE ) 20 MG tablet Take 1 tablet (20 mg total) by mouth daily with breakfast. 7 tablet 0   SYNTHROID  137 MCG tablet Take 1 tablet (137 mcg total) by mouth daily before breakfast. 90 tablet 1   No current facility-administered medications for this visit.   BP (!) 89/62 (BP Location: Right Arm, Patient Position: Sitting, Cuff Size: Large)   Pulse 87   Ht 5' 1 (1.549 m)   Wt 133 lb (60.3 kg)   LMP 04/06/2012   SpO2 94%   BMI 25.13 kg/m   PHYSICAL EXAM:  BP (!) 89/62 (BP Location: Right Arm, Patient Position: Sitting, Cuff Size:  Large)   Pulse 87   Ht 5' 1 (1.549 m)   Wt 133 lb (60.3 kg)   LMP 04/06/2012   SpO2 94%   BMI 25.13 kg/m    Salient findings:  CN II-XII intact Bilateral EAC clear and TM intact with well pneumatized middle ear space on left - small air/fluid level posteriorly right Weber 512: right Rinne 512: AC > BC Left; AC=BC Right Nose: Anterior rhinoscopy reveals modest septal deviation, bilateral inferior turbinates with mild hypertrophy.  Nasal endoscopy was indicated to better evaluate the nose and paranasal sinuses, given the patient's history and exam findings, and is detailed below. No lesions of oral cavity/oropharynx No obviously palpable neck masses/lymphadenopathy/thyromegaly; neck incision healing appropriately No respiratory distress or stridor   PROCEDURE:  Prior to initiating any procedures, risks/benefits/alternatives were explained to the patient and verbal consent obtained. Diagnostic Nasal Endoscopy Pre-procedure diagnosis: Concern for sinusitis, facial pressure Post-procedure diagnosis: same Indication: See pre-procedure diagnosis and physical exam above Complications: None apparent EBL: 0 mL Anesthesia: Lidocaine  4% and topical decongestant was topically sprayed in each nasal cavity  Description of Procedure:  Patient was identified. A rigid 30 degree endoscope was utilized to evaluate the sinonasal cavities, mucosa, sinus ostia and turbinates and septum.  Overall, signs of mucosal inflammation are mild but some mucoid secretions noted along floor of nose.  No mucopurulence, polyps, or masses noted.   Right Middle meatus: clear Right SE Recess: clear Left MM: clear Left SE Recess: clear No masses over ET orifice bilaterally Photodocumentation was obtained.  CPT CODE -- 31231 - Mod 25   ASSESSMENT:  63 y.o. with:  1. Mixed conductive and sensorineural hearing loss of right ear with restricted hearing of left ear   2. Nasal septal deviation   3. Hypertrophy of  both inferior nasal turbinates   4. Nasal obstruction   5. Nasal congestion   6. Post-nasal drip   7. Sensation of fullness in right ear    Noted multiple issues today: Seasonal sinonasal symptoms with nasal congestion and postnasal drip. Symptoms include sinus pressure, nasal discharge. Endo overall without purulence but given persistent symptoms, will treat maximally medically. She has tried medical management but inconsistently. We also discussed possible contribution from smoking and allergies; offered RAST, declined. Prior CT without significant disease. - Start Flonase , two sprays each nostril twice daily. - Start Astelin spray, two sprays each nostril twice  daily. - Daily neti pot or saline rinse with distilled water. - Augmentin  BID x 10 days. - Prednisone  20 mg x7d  Right ear effusion with mixed hearing loss Right ear effusion with mixed hearing loss, suspect due to ETD. No infection today - Perform Valsalva maneuver several times daily. - Will treat as above for ETD - Follow-up with hearing test in early February.  - if no improvement, consider imaging  Given the patient's tobacco use, I also discussed cessation and options for cessation, including counseling. Counseled patient on the dangers of tobacco use, advised patient to stop smoking, and reviewed strategies to maximize success. Total time spent with this was 4 minutes.      See below regarding exact medications prescribed this encounter including dosages and route: Meds ordered this encounter  Medications   azelastine (ASTELIN) 0.1 % nasal spray    Sig: Place 2 sprays into both nostrils 2 (two) times daily. Use in each nostril as directed    Dispense:  30 mL    Refill:  12   amoxicillin -clavulanate (AUGMENTIN ) 875-125 MG tablet    Sig: Take 1 tablet by mouth 2 (two) times daily.    Dispense:  20 tablet    Refill:  0   predniSONE  (DELTASONE ) 20 MG tablet    Sig: Take 1 tablet (20 mg total) by mouth daily with  breakfast.    Dispense:  7 tablet    Refill:  0     Thank you for allowing me the opportunity to care for your patient. Please do not hesitate to contact me should you have any other questions.  Sincerely, Eldora Blanch, MD Otolaryngologist (ENT), [facility] Phone: [phone] Fax: [phone]  MDM:  Level 4: [phone] Complex4ity/Problems addressed: mod  Data complexity: high - independent interpretation of CT; review of note, labs - Morbidity: mod  - Prescription Drug prescribed or managed: y  05/05/2024, 9:20 AM

## 2024-05-05 NOTE — Progress Notes (Signed)
  [address], [address] [place], [place] [address] [phone]  Audiological Evaluation    Name: Lauren Beck     DOB:   [birth year]      MRN:   [id number]                                                                                     Service Date: 05/05/2024     Accompanied by: husband   Patient comes today after Dr. Tobie, ENT sent a referral for a hearing evaluation due to concerns with hearing loss.   Symptoms Yes Details  Hearing loss  [x]  Maybe in one ear- husband said the right  Tinnitus  []    Ear pain/ infections/pressure  [x]  Ears sometimes get clogged  Balance problems  []    Noise exposure history  []    Previous ear surgeries  []    Family history of hearing loss  []    Amplification  []    Other  [x]  Reports sinus issues    Otoscopy: Right ear: Clear external ear canal and notable landmarks visualized on the tympanic membrane. Left ear:  Clear external ear canal and notable landmarks visualized on the tympanic membrane.  Tympanometry: Right ear: Normal external ear canal volume with negative middle ear pressure and reduced tympanic membrane compliance. Findings are suggestive of abnormal middle ear function.  Left ear: Normal external ear canal volume with normal middle ear pressure and tympanic membrane compliance (Type A). Findings are suggestive of normal middle ear function.  Hearing Evaluation The hearing test results were completed under headphones and results are deemed to be of good reliability. Test technique:  conventional    Pure tone Audiometry: Right ear- Normal to moderate mixed hearing loss from 250 Hz - 8000 Hz. Left ear-  Normal to moderately severe sensorineural hearing loss from 250 Hz - 8000 Hz.  Speech Audiometry: Right ear- Speech Reception Threshold (SRT) was obtained at 30 dBHL. Left ear-Speech Reception Threshold (SRT) was obtained at 25 dBHL.   Word Recognition Score Tested using NU-6 (recorded) Right ear: 100% was obtained  at a presentation level of 70 dBHL with contralateral masking which is deemed as  excellent. Left ear: 100% was obtained at a presentation level of 65 dBHL with contralateral masking which is deemed as  excellent.     Recommendations: Follow up with ENT as scheduled for today., Repeat audiogram after medical care.   Taimi Towe MARIE LEROUX-MARTINEZ, AUD

## 2024-05-05 NOTE — Patient Instructions (Addendum)
 Take Augmentin  875 mg by mouth (PO) twice daily for 10 days; take with food, take probiotic or yogurt with it. Take 20mg  of prednisone  in the morning with food for 7 days.  Pop your nose multiple times per day  Use two sprays of flonase  in each nostril twice per day; right after, use astelin spray two sprays each nostril twice per day  Aureliano Med Nasal Saline Rinse - use daily - start nasal saline rinses with NeilMed Bottle available over the counter    Nasal Saline Irrigation instructions: If you choose to make your own salt water solution, You will need: Salt (kosher, canning, or pickling salt) Baking soda Nasal irrigation bottle (i.e. Aureliano Med Sinus Rinse) Measuring spoon ( teaspoon) Distilled / boiled water   Mix solution Mix 1 teaspoon of salt, 1/2 teaspoon of baking soda and 1 cup of water into irrigation bottle ** May use saline packet instead of homemade recipe for this step if you prefer If medicine was prescribed to be mixed with solution, place this into bottle Examples 2 inches of 2% mupirocin ointment Budesonide  solution Position your head: Lean over sink (about 45 degrees) Rotate head (about 45 degrees) so that one nostril is above the other Irrigate Insert tip of irrigation bottle into upper nostril so it forms a comfortable seal Irrigate while breathing through your mouth May remove the straw from the bottle in order to irrigate the entire solution (important if medicine was added) Exhale through nose when finished and blow nose as necessary  Repeat on opposite side with other 1/2 of solution (120 mL) or remake solution if all 240 mL was used on first side Wash irrigation bottle regularly, replace every 3 months

## 2024-05-16 ENCOUNTER — Telehealth (INDEPENDENT_AMBULATORY_CARE_PROVIDER_SITE_OTHER): Payer: Self-pay

## 2024-05-16 NOTE — Telephone Encounter (Signed)
 Patient called stated she had been taking the medication that Dr. Tobie had prescribed her and everything was getting better but now Patients right ear is clogged up and she is having trouble hearing out of it.

## 2024-05-18 ENCOUNTER — Telehealth (INDEPENDENT_AMBULATORY_CARE_PROVIDER_SITE_OTHER): Payer: Self-pay

## 2024-05-18 NOTE — Telephone Encounter (Signed)
 LVM informing patient of providers recommendation.

## 2024-05-18 NOTE — Telephone Encounter (Signed)
Patient called back returning your phone call.

## 2024-05-24 ENCOUNTER — Encounter: Payer: Self-pay | Admitting: Gastroenterology

## 2024-05-24 ENCOUNTER — Ambulatory Visit: Admitting: Gastroenterology

## 2024-05-24 ENCOUNTER — Telehealth (INDEPENDENT_AMBULATORY_CARE_PROVIDER_SITE_OTHER): Payer: Self-pay | Admitting: Otolaryngology

## 2024-05-24 VITALS — BP 126/66 | HR 65 | Ht 61.0 in | Wt 133.0 lb

## 2024-05-24 DIAGNOSIS — K449 Diaphragmatic hernia without obstruction or gangrene: Secondary | ICD-10-CM

## 2024-05-24 DIAGNOSIS — J449 Chronic obstructive pulmonary disease, unspecified: Secondary | ICD-10-CM

## 2024-05-24 DIAGNOSIS — K219 Gastro-esophageal reflux disease without esophagitis: Secondary | ICD-10-CM

## 2024-05-24 DIAGNOSIS — R1314 Dysphagia, pharyngoesophageal phase: Secondary | ICD-10-CM

## 2024-05-24 NOTE — Telephone Encounter (Signed)
 The patient called in because both of her ears now feel clogged.  She has taken the one medication of small pills for 5 days but has not completed the other medication as directed but has not finished yet ( these are the larger pills) I have made her an appointment next week, she would like to know what she can do in the meantime? Please advise.

## 2024-05-24 NOTE — Patient Instructions (Signed)
 You have been scheduled for a Barium Esophogram at [facility] (1st floor of the hospital) on 06/06/24 at 11 am. Please arrive 30 minutes prior to your appointment for registration. Make certain not to have anything to eat or drink 3 hours prior to your test. If you need to reschedule for any reason, please contact radiology at [phone] to do so. __________________________________________________________________ A barium swallow is an examination that concentrates on views of the esophagus. This tends to be a double contrast exam (barium and two liquids which, when combined, create a gas to distend the wall of the oesophagus) or single contrast (non-ionic iodine based). The study is usually tailored to your symptoms so a good history is essential. Attention is paid during the study to the form, structure and configuration of the esophagus, looking for functional disorders (such as aspiration, dysphagia, achalasia, motility and reflux) EXAMINATION You may be asked to change into a gown, depending on the type of swallow being performed. A radiologist and radiographer will perform the procedure. The radiologist will advise you of the type of contrast selected for your procedure and direct you during the exam. You will be asked to stand, sit or lie in several different positions and to hold a small amount of fluid in your mouth before being asked to swallow while the imaging is performed .In some instances you may be asked to swallow barium coated marshmallows to assess the motility of a solid food bolus. The exam can be recorded as a digital or video fluoroscopy procedure. POST PROCEDURE It will take 1-2 days for the barium to pass through your system. To facilitate this, it is important, unless otherwise directed, to increase your fluids for the next 24-48hrs and to resume your normal diet.  This test typically takes about 30 minutes to  perform. __________________________________________________________________________________  Thank you for trusting me with your gastrointestinal care!    Dr. Victory Legrand [facility]

## 2024-05-24 NOTE — Progress Notes (Signed)
 [facility] Progress Note  Chief Complaint: Dysphagia  Subjective  Prior history  I saw her in office consultation July 2023 for years of heartburn, dysphagia and dyspepsia.  Clinical details outlined in that office consult note.  Ongoing smoking with recurrent bronchitis.  Ultrasound negative for gallstones. Endoscopic procedures 02/14/2022: Colonoscopy complete to cecum, preparation poor (GoLytely  prep).  Plan was for repeat colonoscopy in a year with 2 successive prep days of Suprep followed by GoLytely . Upper endoscopy with a 4 to 5 cm hiatal hernia resulting in distal esophageal tortuosity.  There was a mild Schatzki ring disrupted with biopsy forceps (endoscopic dilation would have been challenging since patient had significant coughing and gagging during procedure).  Mild diffuse nonspecific gastritis,, biopsies negative for H. pylori.  Patient strongly advised to continue acid suppression as well as nonpharmacologic antireflux measures, and also strongly advised to quit smoking.   Similar symptoms when last seen June 2024.  Was hypothyroid.   Changed zegerid to BID Protonix .   Needed repeat colon with 2 day prep after her cervical spine surgery  No show to clinic 03/29/24  3 level cervical spine fusion May 2025  Discussed the use of AI scribe software for clinical note transcription with the patient, who gave verbal consent to proceed.  History of Present Illness Lauren Beck is a 63 year old female with a history of cervical spine fusion and hiatal hernia who presents with worsening swallowing difficulties. She is accompanied by her husband.  Dysphagia - Progressive difficulty swallowing, particularly with large pills since cervical spine fusion in May 2025 for bone spurs and arthritis - Requires cutting pills in half and using warm water to aid swallowing - Sensation of pill edges scratching and getting stuck in the throat, with prolonged passage time.  These are large  tablets Rx by ENT for some inner ear problems.  No details of that today - due to see them next week - Persistent symptoms since surgery  Esophageal stricture and hiatal hernia - Known hiatal hernia contributing to swallowing difficulties - Anatomical changes from hernia limit effectiveness of endoscopic interventions  Sinus congestion - Ongoing sinus issues - Prescribed medication by an otolaryngologist, but has difficulty swallowing the medication - Follow-up appointment with ENT scheduled for next Tuesday  Pulmonary disease - History of COPD and emphysema - Continues to smoke - reports that recent lung cancer screening was negative  Thyroid  dysfunction - Diagnosed with Hashimoto's thyroiditis ? - Symptoms include hair loss and fluctuating weight - Upcoming appointment for thyroid  management  She also reports being unable to schedule colonoscopy b/c feels she simply cannot tolerate a liquid bowel prep due to prior experiences.   ROS: Cardiovascular:  no chest pain Respiratory: no dyspnea Arthralgias Remainder of systems negative except as above  The patient's Past Medical, Family and Social History were reviewed and are on file in the EMR. Past Medical History:  Diagnosis Date   Allergic rhinitis    Arthritis    Bronchitis    Cervical spondylitis with radiculitis    Cervical stenosis of spine    COPD (chronic obstructive pulmonary disease) (HCC)    Excessive or frequent menstruation    GERD (gastroesophageal reflux disease)    History of kidney stones    History of migraines    Hypothyroidism    Intermittent claudication    Mixed hyperlipidemia    Nonspecific abnormal electrocardiogram (ECG) (EKG)    Pre-diabetes    Pure hypercholesterolemia    Tobacco  use    Tobacco use disorder    Unspecified adjustment reaction     Past Surgical History:  Procedure Laterality Date   ANTERIOR CERVICAL DECOMP/DISCECTOMY FUSION N/A 10/28/2023   Procedure: ANTERIOR CERVICAL  DECOMPRESSION/DISCECTOMY FUSION 3 LEVELS;  Surgeon: Clois Fret, MD;  Location: [facility] ORS;  Service: Neurosurgery;  Laterality: N/A;  C4-7 ACDF   COLONOSCOPY     ESOPHAGOGASTRODUODENOSCOPY     HAMMER TOE SURGERY Bilateral    PELVIC LAPAROSCOPY     exploratory, thought might have a cyst, not seen   TUBAL LIGATION       Objective:  Med list reviewed  Current Outpatient Medications:    acetaminophen  (TYLENOL ) 500 MG tablet, Take 1,000 mg by mouth every 6 (six) hours as needed for moderate pain (pain score 4-6)., Disp: , Rfl:    amoxicillin -clavulanate (AUGMENTIN ) 875-125 MG tablet, Take 1 tablet by mouth 2 (two) times daily., Disp: 20 tablet, Rfl: 0   APPLE CIDER VINEGAR PO, Take 1 capsule by mouth daily., Disp: , Rfl:    atorvastatin  (LIPITOR) 10 MG tablet, Take 1 tablet (10 mg total) by mouth daily., Disp: 90 tablet, Rfl: 3   azelastine  (ASTELIN ) 0.1 % nasal spray, Place 2 sprays into both nostrils 2 (two) times daily. Use in each nostril as directed, Disp: 30 mL, Rfl: 12   bismuth  subsalicylate (PEPTO BISMOL) 262 MG/15ML suspension, Take 15 mLs by mouth every 6 (six) hours as needed for indigestion or diarrhea or loose stools., Disp: , Rfl:    cetirizine  (ZYRTEC ) 10 MG tablet, Take 1 tablet (10 mg total) by mouth daily., Disp: 90 tablet, Rfl: 3   fluticasone  (FLONASE ) 50 MCG/ACT nasal spray, Place 2 sprays into both nostrils daily., Disp: 16 g, Rfl: 6   Fluticasone -Umeclidin-Vilant (TRELEGY ELLIPTA ) 100-62.5-25 MCG/ACT AEPB, Inhale 1 puff into the lungs daily., Disp: 14 each, Rfl: 0   meclizine  (ANTIVERT ) 12.5 MG tablet, Take 1/2 to one tablet po every day prn vertigo, Disp: 30 tablet, Rfl: 0   meloxicam  (MOBIC ) 15 MG tablet, Take 1 tablet (15 mg total) by mouth daily., Disp: 14 tablet, Rfl: 0   methocarbamol  (ROBAXIN ) 500 MG tablet, Take 1 tablet (500 mg total) by mouth every 8 (eight) hours as needed for muscle spasms. Can make you sleepy., Disp: 60 tablet, Rfl: 0   montelukast   (SINGULAIR ) 10 MG tablet, Take 1 tablet (10 mg total) by mouth at bedtime., Disp: 90 tablet, Rfl: 3   Multiple Vitamin (MULTIVITAMIN WITH MINERALS) TABS tablet, Take 1 tablet by mouth daily., Disp: , Rfl:    Omeprazole -Sodium Bicarbonate (ZEGERID) 20-1100 MG CAPS capsule, Take 1 capsule by mouth daily as needed (heartburn)., Disp: , Rfl:    SYNTHROID  137 MCG tablet, Take 1 tablet (137 mcg total) by mouth daily before breakfast., Disp: 90 tablet, Rfl: 1   predniSONE  (DELTASONE ) 20 MG tablet, Take 1 tablet (20 mg total) by mouth daily with breakfast., Disp: 7 tablet, Rfl: 0   Vital signs in last 24 hrs: Vitals:   05/24/24 1101  BP: 126/66  Pulse: 65   Wt Readings from Last 3 Encounters:  05/24/24 133 lb (60.3 kg)  05/05/24 133 lb (60.3 kg)  03/21/24 132 lb 6.4 oz (60.1 kg)    Physical Exam  Left sided ACDF scar No dysphonia HEENT: sclera anicteric, oral mucosa moist without lesions Neck: supple, no thyromegaly, JVD or lymphadenopathy Cardiac: regular without murmur,  no peripheral edema Pulm: clear to auscultation bilaterally, normal RR and effort noted Abdomen: soft, no  tenderness, with active bowel sounds. No guarding or palpable hepatosplenomegaly. Skin; warm and dry, no jaundice or rash   Labs:   ___________________________________________ Radiologic studies:   ____________________________________________ Other:   _____________________________________________   Encounter Diagnoses  Name Primary?   Pharyngoesophageal dysphagia Yes   Hiatal hernia    Gastroesophageal reflux disease without esophagitis     Assessment & Plan Long-standing Dysphagia due to hiatal hernia and esophageal anatomic distortion with mild Schatzki ring Dysphagia worsening, likely multifactorial due to cervical spine fusion.  If so, not clear if any role for endoscopy. Previous endoscopy inconclusive, further investigation required. - Ordered Barium esophagram.  Take meds with yogurt or  applesauce - Follow up with ENT specialist on Tuesday.  Gastroesophageal reflux disease GERD symptoms persist, likely exacerbated by hiatal hernia. Surgical intervention requires smoking cessation. - Discussed potential surgical intervention for hiatal hernia if symptoms worsen, contingent on smoking cessation.  32 minutes were spent on this encounter (including chart review, history/exam, counseling/coordination of care, and documentation) > 50% of that time was spent on counseling and coordination of care.   Victory LITTIE Brand III

## 2024-05-25 ENCOUNTER — Encounter: Payer: Self-pay | Admitting: Audiology

## 2024-05-25 MED ORDER — PREDNISONE 20 MG PO TABS: 20.0000 mg | ORAL_TABLET | Freq: Every day | ORAL | 0 refills | Status: AC

## 2024-05-25 NOTE — Telephone Encounter (Signed)
 Rx 5 more days of pred and see how she does

## 2024-05-26 ENCOUNTER — Encounter: Payer: Self-pay | Admitting: Family Medicine

## 2024-05-26 ENCOUNTER — Ambulatory Visit: Payer: Self-pay | Admitting: Family Medicine

## 2024-05-26 ENCOUNTER — Ambulatory Visit (INDEPENDENT_AMBULATORY_CARE_PROVIDER_SITE_OTHER): Admitting: Family Medicine

## 2024-05-26 VITALS — BP 106/58 | HR 79 | Temp 97.4°F | Ht 61.0 in | Wt 134.0 lb

## 2024-05-26 DIAGNOSIS — E039 Hypothyroidism, unspecified: Secondary | ICD-10-CM

## 2024-05-26 DIAGNOSIS — H8113 Benign paroxysmal vertigo, bilateral: Secondary | ICD-10-CM

## 2024-05-26 DIAGNOSIS — R5383 Other fatigue: Secondary | ICD-10-CM | POA: Insufficient documentation

## 2024-05-26 DIAGNOSIS — R9389 Abnormal findings on diagnostic imaging of other specified body structures: Secondary | ICD-10-CM

## 2024-05-26 DIAGNOSIS — R5382 Chronic fatigue, unspecified: Secondary | ICD-10-CM

## 2024-05-26 DIAGNOSIS — R7303 Prediabetes: Secondary | ICD-10-CM | POA: Diagnosis not present

## 2024-05-26 LAB — TSH: TSH: 0.21 u[IU]/mL — ABNORMAL LOW (ref 0.35–5.50)

## 2024-05-26 LAB — COMPREHENSIVE METABOLIC PANEL WITH GFR
ALT: 15 U/L (ref 0–35)
AST: 16 U/L (ref 0–37)
Albumin: 4 g/dL (ref 3.5–5.2)
Alkaline Phosphatase: 111 U/L (ref 39–117)
BUN: 16 mg/dL (ref 6–23)
CO2: 29 meq/L (ref 19–32)
Calcium: 9.4 mg/dL (ref 8.4–10.5)
Chloride: 106 meq/L (ref 96–112)
Creatinine, Ser: 0.86 mg/dL (ref 0.40–1.20)
GFR: 72.25 mL/min (ref >60.00)
Glucose, Bld: 101 mg/dL — ABNORMAL HIGH (ref 70–99)
Potassium: 4.7 meq/L (ref 3.5–5.1)
Sodium: 141 meq/L (ref 135–145)
Total Bilirubin: 0.3 mg/dL (ref 0.2–1.2)
Total Protein: 6.3 g/dL (ref 6.0–8.3)

## 2024-05-26 LAB — CBC WITH DIFFERENTIAL/PLATELET
Basophils Absolute: 0 K/uL (ref 0.0–0.1)
Basophils Relative: 0.6 % (ref 0.0–3.0)
Eosinophils Absolute: 0.2 K/uL (ref 0.0–0.7)
Eosinophils Relative: 2.4 % (ref 0.0–5.0)
HCT: 39.1 % (ref 36.0–46.0)
Hemoglobin: 12.8 g/dL (ref 12.0–15.0)
Lymphocytes Relative: 35.8 % (ref 12.0–46.0)
Lymphs Abs: 2.6 K/uL (ref 0.7–4.0)
MCHC: 32.9 g/dL (ref 30.0–36.0)
MCV: 97.2 fl (ref 78.0–100.0)
Monocytes Absolute: 0.5 K/uL (ref 0.1–1.0)
Monocytes Relative: 7.3 % (ref 3.0–12.0)
Neutro Abs: 3.9 K/uL (ref 1.4–7.7)
Neutrophils Relative %: 53.9 % (ref 43.0–77.0)
Platelets: 253 K/uL (ref 150.0–400.0)
RBC: 4.02 Mil/uL (ref 3.87–5.11)
RDW: 14 % (ref 11.5–15.5)
WBC: 7.3 K/uL (ref 4.0–10.5)

## 2024-05-26 LAB — VITAMIN B12: Vitamin B-12: 389 pg/mL (ref 211–911)

## 2024-05-26 LAB — IRON: Iron: 98 ug/dL (ref 42–145)

## 2024-05-26 LAB — VITAMIN D 25 HYDROXY (VIT D DEFICIENCY, FRACTURES): VITD: 18.69 ng/mL — ABNORMAL LOW (ref 30.00–100.00)

## 2024-05-26 LAB — HEMOGLOBIN A1C: Hgb A1c MFr Bld: 5.7 % (ref 4.6–6.5)

## 2024-05-26 MED ORDER — MECLIZINE HCL 12.5 MG PO TABS: ORAL_TABLET | ORAL | 0 refills | Status: AC

## 2024-05-26 NOTE — Assessment & Plan Note (Addendum)
 Pt relays history of hashimoto's  Is more tired lately despite no missed doses of levothyroxine  137 mcg daily (holds one day per week) Also more hair shedding  Normal TSH in aug   Re check today  Unremarkable exam Of note-fatigue may be multi factorial

## 2024-05-26 NOTE — Assessment & Plan Note (Signed)
 Ongoing but worse lately  Has also noted diffuse hair shedding  In smoker with prediabetes , hypothyroidism, chronic pain and neck surgery with swallowing problems  Currently taking prednisone  for ear problem  No missed doses of levothyroxine  and takes correctly  No recent infection   Reassuring exam Lab today   Will reach out with results

## 2024-05-26 NOTE — Patient Instructions (Signed)
 Labs today for thyroid  and fatigue   Take care of yourself  Try to eat and drink fluids regularly   I sent in a refill of your vertigo medicine   We will reach out with results and plan

## 2024-05-26 NOTE — Progress Notes (Signed)
 Subjective:    Patient ID: Lauren Beck, female    DOB: [birth year], 63 y.o.   MRN: [id number]  HPI  Wt Readings from Last 3 Encounters:  05/26/24 134 lb (60.8 kg)  05/24/24 133 lb (60.3 kg)  05/05/24 133 lb (60.3 kg)   25.32 kg/m  Vitals:   05/26/24 1231  BP: (!) 106/58  Pulse: 79  Temp: (!) 97.4 F (36.3 C)  SpO2: 100%    63 yo pt of NP Dugal presents with c/o  Fatigued  Hair loss  Needs refill of meclizine      Pmhx notable for  copd Hypothyroidism , taking levothyroxine  137 mcg daily (hashimotos) Lab Results  Component Value Date   TSH 1.05 02/09/2024  Prediabetes  Smoking  Cervical spine fusion  Taking prednisone  for ear congestion currently   Thyroid  us  in sept noted diminutive heterogeneous thyroid  w/o nodules    Very tired  Long time , but worse lately  Just drained   Appetite is fair (so/so)  Sleep is on/off-due to arm discomfort  Stress- trying to get ss  This has been hard  Chronic neck and arm pain   No change in neck/appearance  Trouble swallowing big pills since her neck surgery   No tremor Gets numbness in hands on and off   Prednisone    Lab Results  Component Value Date   NA 141 01/07/2024   K 4.2 01/07/2024   CO2 29 01/07/2024   GLUCOSE 91 01/07/2024   BUN 15 01/07/2024   CREATININE 0.76 01/07/2024   CALCIUM  9.3 01/07/2024   GFR 84.03 01/07/2024   GFRNONAA >60 02/01/2022   Lab Results  Component Value Date   ALT 13 01/07/2024   AST 15 01/07/2024   ALKPHOS 103 01/07/2024   BILITOT 0.3 01/07/2024   Lab Results  Component Value Date   WBC 6.4 10/19/2023   HGB 13.7 10/19/2023   HCT 40.5 10/19/2023   MCV 99.8 10/19/2023   PLT 293.0 10/19/2023   Lab Results  Component Value Date   HGBA1C 6.1 01/07/2024   HGBA1C 6.1 10/19/2023   HGBA1C 6.0 10/06/2022     Patient Active Problem List   Diagnosis Date Noted   Fatigue 05/26/2024   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/03/2024   Benign paroxysmal positional vertigo due to  bilateral vestibular disorder 01/07/2024   Cervical radiculopathy 10/28/2023   Cervical stenosis of spinal canal 10/28/2023   Left arm weakness 10/28/2023   S/P cervical spinal fusion 10/28/2023   Chronic left shoulder pain 10/28/2023   Tobacco abuse 10/19/2023   Pre-operative examination for internal medicine 10/19/2023   Bilirubin in urine 04/27/2023   High serum vitamin B12 04/27/2023   Non-seasonal allergic rhinitis 07/24/2022   COPD (chronic obstructive pulmonary disease) with chronic bronchitis (HCC) 05/12/2022   Prediabetes 05/12/2022   Intermittent claudication 05/12/2022   DOE (dyspnea on exertion) 02/04/2022   Cervical spondylitis with radiculitis 02/04/2022   Neuralgia 02/04/2022   Postmenopausal 11/14/2021   Mixed hyperlipidemia 11/14/2021   Acquired hypothyroidism 11/14/2021   Gastroesophageal reflux disease 11/14/2021   Past Medical History:  Diagnosis Date   Allergic rhinitis    Arthritis    Bronchitis    Cervical spondylitis with radiculitis    Cervical stenosis of spine    COPD (chronic obstructive pulmonary disease) (HCC)    Excessive or frequent menstruation    GERD (gastroesophageal reflux disease)    History of kidney stones    History of migraines    Hypothyroidism    Intermittent  claudication    Mixed hyperlipidemia    Nonspecific abnormal electrocardiogram (ECG) (EKG)    Pre-diabetes    Pure hypercholesterolemia    Tobacco use    Tobacco use disorder    Unspecified adjustment reaction    Past Surgical History:  Procedure Laterality Date   ANTERIOR CERVICAL DECOMP/DISCECTOMY FUSION N/A 10/28/2023   Procedure: ANTERIOR CERVICAL DECOMPRESSION/DISCECTOMY FUSION 3 LEVELS;  Surgeon: Clois Fret, MD;  Location: [facility] ORS;  Service: Neurosurgery;  Laterality: N/A;  C4-7 ACDF   COLONOSCOPY     ESOPHAGOGASTRODUODENOSCOPY     HAMMER TOE SURGERY Bilateral    PELVIC LAPAROSCOPY     exploratory, thought might have a cyst, not seen   TUBAL LIGATION      Social History   Tobacco Use   Smoking status: Every Day    Current packs/day: 1.50    Average packs/day: 1.5 packs/day for 45.9 years (68.9 ttl pk-yrs)    Types: Cigarettes    Start date: 1980   Smokeless tobacco: Never   Tobacco comments:    Pt states she smokes 0.5- 1 ppd due to boredom. AB, CMA 03-14-24  Vaping Use   Vaping status: Former   Substances: Nicotine   Substance Use Topics   Alcohol use: Yes    Alcohol/week: 1.0 standard drink of alcohol    Types: 1 Glasses of wine per week    Comment: occasional wine   Drug use: No    Comment: tried pot once, didn't like it   Family History  Problem Relation Age of Onset   Other Father        No contact--unknown   Breast cancer Sister    Thyroid  disease Daughter    Anxiety disorder Daughter    OCD Daughter    Rectal cancer Neg Hx    Esophageal cancer Neg Hx    Stomach cancer Neg Hx    Colon cancer Neg Hx    Allergies  Allergen Reactions   Benadryl [Diphenhydramine Hcl] Other (See Comments)    Makes patient extremely drowsy     Hydrocodone      States has headaches from such   Levaquin  [Levofloxacin ]     arthralgia   Current Outpatient Medications on File Prior to Visit  Medication Sig Dispense Refill   acetaminophen  (TYLENOL ) 500 MG tablet Take 1,000 mg by mouth every 6 (six) hours as needed for moderate pain (pain score 4-6).     APPLE CIDER VINEGAR PO Take 1 capsule by mouth daily.     atorvastatin  (LIPITOR) 10 MG tablet Take 1 tablet (10 mg total) by mouth daily. 90 tablet 3   azelastine  (ASTELIN ) 0.1 % nasal spray Place 2 sprays into both nostrils 2 (two) times daily. Use in each nostril as directed 30 mL 12   bismuth  subsalicylate (PEPTO BISMOL) 262 MG/15ML suspension Take 15 mLs by mouth every 6 (six) hours as needed for indigestion or diarrhea or loose stools.     cetirizine  (ZYRTEC ) 10 MG tablet Take 1 tablet (10 mg total) by mouth daily. 90 tablet 3   fluticasone  (FLONASE ) 50 MCG/ACT nasal spray Place 2  sprays into both nostrils daily. 16 g 6   Fluticasone -Umeclidin-Vilant (TRELEGY ELLIPTA ) 100-62.5-25 MCG/ACT AEPB Inhale 1 puff into the lungs daily. 14 each 0   meloxicam  (MOBIC ) 15 MG tablet Take 1 tablet (15 mg total) by mouth daily. 14 tablet 0   methocarbamol  (ROBAXIN ) 500 MG tablet Take 1 tablet (500 mg total) by mouth every 8 (eight) hours as needed for  muscle spasms. Can make you sleepy. 60 tablet 0   montelukast  (SINGULAIR ) 10 MG tablet Take 1 tablet (10 mg total) by mouth at bedtime. 90 tablet 3   Multiple Vitamin (MULTIVITAMIN WITH MINERALS) TABS tablet Take 1 tablet by mouth daily.     Omeprazole -Sodium Bicarbonate (ZEGERID) 20-1100 MG CAPS capsule Take 1 capsule by mouth daily as needed (heartburn).     predniSONE  (DELTASONE ) 20 MG tablet Take 1 tablet (20 mg total) by mouth daily with breakfast for 5 days. 5 tablet 0   SYNTHROID  137 MCG tablet Take 1 tablet (137 mcg total) by mouth daily before breakfast. 90 tablet 1   No current facility-administered medications on file prior to visit.    Review of Systems  Constitutional:  Positive for appetite change and fatigue. Negative for fever and unexpected weight change.  HENT:  Positive for trouble swallowing.   Respiratory:  Negative for cough.   Cardiovascular:  Negative for chest pain, palpitations and leg swelling.  Gastrointestinal:  Negative for abdominal pain.  Genitourinary:  Negative for dysuria.  Musculoskeletal:  Negative for arthralgias and neck stiffness.       Chronic left UE pain and weakness Chronic neck pain   Neurological:  Positive for weakness and headaches. Negative for dizziness, tremors, facial asymmetry, light-headedness and numbness.       Occational vertigo Not today  C/o left arm pain/ weakness (baseline)  Psychiatric/Behavioral:  Positive for dysphoric mood. Negative for confusion and decreased concentration.        Objective:   Physical Exam Constitutional:      General: She is not in acute  distress.    Appearance: Normal appearance. She is well-developed and normal weight. She is not ill-appearing or diaphoretic.  HENT:     Head: Normocephalic and atraumatic.  Eyes:     Conjunctiva/sclera: Conjunctivae normal.     Pupils: Pupils are equal, round, and reactive to light.  Neck:     Thyroid : No thyromegaly.     Vascular: No carotid bruit or JVD.     Comments: No obvious thyromegally Cardiovascular:     Rate and Rhythm: Normal rate and regular rhythm.     Heart sounds: Normal heart sounds.     No gallop.  Pulmonary:     Effort: Pulmonary effort is normal. No respiratory distress.     Breath sounds: Normal breath sounds. No stridor. No wheezing, rhonchi or rales.     Comments: Diffusely distant bs  Abdominal:     General: There is no distension or abdominal bruit.     Palpations: Abdomen is soft.  Musculoskeletal:     Cervical back: Normal range of motion and neck supple. Tenderness present.     Right lower leg: No edema.     Left lower leg: No edema.  Lymphadenopathy:     Cervical: No cervical adenopathy.  Skin:    General: Skin is warm and dry.     Coloration: Skin is not pale.     Findings: No bruising or rash.  Neurological:     Mental Status: She is alert.     Cranial Nerves: No cranial nerve deficit.     Motor: No tremor.     Coordination: Coordination normal.     Gait: Gait normal.     Deep Tendon Reflexes: Reflexes are normal and symmetric. Reflexes normal.  Psychiatric:        Mood and Affect: Mood normal.     Comments: Candidly discusses symptoms and stressors  Assessment & Plan:   Problem List Items Addressed This Visit       Endocrine   Acquired hypothyroidism - Primary   Pt relays history of hashimoto's  Is more tired lately despite no missed doses of levothyroxine  137 mcg daily (holds one day per week) Also more hair shedding  Normal TSH in aug   Re check today  Unremarkable exam Of note-fatigue may be multi  factorial      Relevant Orders   TSH     Nervous and Auditory   Benign paroxysmal positional vertigo due to bilateral vestibular disorder   Pt takes meclizine  occationally nad needed refil while here today for another issue  Not dizzy today Sent meclizine  times one - warned of sedation       Relevant Medications   meclizine  (ANTIVERT ) 12.5 MG tablet     Other   Prediabetes   A1c ordered       Relevant Orders   Hemoglobin A1c   Fatigue   Ongoing but worse lately  Has also noted diffuse hair shedding  In smoker with prediabetes , hypothyroidism, chronic pain and neck surgery with swallowing problems  Currently taking prednisone  for ear problem  No missed doses of levothyroxine  and takes correctly  No recent infection   Reassuring exam Lab today   Will reach out with results       Relevant Orders   VITAMIN D 25 Hydroxy (Vit-D Deficiency, Fractures)   Iron   CBC with Differential/Platelet   Vitamin B12   TSH   Comprehensive metabolic panel with GFR

## 2024-05-26 NOTE — Assessment & Plan Note (Signed)
 A1c ordered.

## 2024-05-26 NOTE — Telephone Encounter (Signed)
 Left a voicemail informing patient of providers recommendations.

## 2024-05-26 NOTE — Assessment & Plan Note (Signed)
 Pt takes meclizine  occationally nad needed refil while here today for another issue  Not dizzy today Sent meclizine  times one - warned of sedation

## 2024-05-30 MED ORDER — SYNTHROID 125 MCG PO TABS: 125.0000 ug | ORAL_TABLET | Freq: Every day | ORAL | 1 refills | Status: AC

## 2024-05-31 ENCOUNTER — Ambulatory Visit (INDEPENDENT_AMBULATORY_CARE_PROVIDER_SITE_OTHER): Admitting: Otolaryngology

## 2024-05-31 ENCOUNTER — Encounter (INDEPENDENT_AMBULATORY_CARE_PROVIDER_SITE_OTHER): Payer: Self-pay | Admitting: Otolaryngology

## 2024-05-31 ENCOUNTER — Other Ambulatory Visit: Payer: Self-pay | Admitting: *Deleted

## 2024-05-31 VITALS — BP 114/75 | HR 87 | Ht 61.0 in | Wt 133.0 lb

## 2024-05-31 DIAGNOSIS — J342 Deviated nasal septum: Secondary | ICD-10-CM

## 2024-05-31 DIAGNOSIS — J328 Other chronic sinusitis: Secondary | ICD-10-CM

## 2024-05-31 DIAGNOSIS — H938X1 Other specified disorders of right ear: Secondary | ICD-10-CM

## 2024-05-31 DIAGNOSIS — E782 Mixed hyperlipidemia: Secondary | ICD-10-CM

## 2024-05-31 DIAGNOSIS — J3489 Other specified disorders of nose and nasal sinuses: Secondary | ICD-10-CM

## 2024-05-31 DIAGNOSIS — R0982 Postnasal drip: Secondary | ICD-10-CM

## 2024-05-31 DIAGNOSIS — H90A31 Mixed conductive and sensorineural hearing loss, unilateral, right ear with restricted hearing on the contralateral side: Secondary | ICD-10-CM

## 2024-05-31 DIAGNOSIS — R0981 Nasal congestion: Secondary | ICD-10-CM

## 2024-05-31 DIAGNOSIS — J343 Hypertrophy of nasal turbinates: Secondary | ICD-10-CM

## 2024-05-31 DIAGNOSIS — F17200 Nicotine dependence, unspecified, uncomplicated: Secondary | ICD-10-CM

## 2024-05-31 DIAGNOSIS — H6991 Unspecified Eustachian tube disorder, right ear: Secondary | ICD-10-CM

## 2024-05-31 MED ORDER — DOXYCYCLINE HYCLATE 100 MG PO TABS: 100.0000 mg | ORAL_TABLET | Freq: Two times a day (BID) | ORAL | 0 refills | Status: AC

## 2024-05-31 MED ORDER — ATORVASTATIN CALCIUM 10 MG PO TABS: 10.0000 mg | ORAL_TABLET | Freq: Every day | ORAL | 1 refills | Status: AC

## 2024-05-31 NOTE — Progress Notes (Unsigned)
 Dear Dr. Corwin, Here is my assessment for our mutual patient, Lauren Beck. Thank you for allowing me the opportunity to care for your patient. Please do not hesitate to contact me should you have any other questions. Sincerely, Dr. Eldora Blanch  Otolaryngology Clinic Note  HISTORY:  Initial visit (04/2024): Discussed the use of AI scribe software for clinical note transcription with the patient, who gave verbal consent to proceed.  History of Present Illness Lauren Beck is a 63 year old female with emphysema and COPD who presents with seasonal sinus congestion and ear problems.  She experiences bilateral nasal congestion and sinonasal pressure primarily from October to February, with symptoms including a runny nose, sinus pressure in the cheeks and less so forehead. Congestion alternates. Coughing and sometimes discolored nasal drainage occur, though it is usually not green or yellow. No hyposmia. She has tried Flonase , Astelin  spray, saline rinses, and allergy pills like Allegra and Claritin , but this is inconsistent, with inconsistent effectiveness.  She has a history of smoking and has been diagnosed with emphysema and COPD.  She has tried flonase  (intermittently) and astelin  (prior). Prior nasal rinses. Tried PO anthistamine. Improvement occurred with nothing.  Additional evaluation has included CT (2024).  Allergy testing has not been done. Does get allergy symptoms (once in a great blue moon). No previous sinonasal surgery.  She is currently using PO antihistamine. Intermittent flonase .   Ear wise: mostly has a problem where she feels stopped up, under water. Does have intermittent tinnitus. Does have popping/crackling. Can't pop ears. Ear issues, however, occur all the time. Does have hearing loss. Mostly on right. Denies vertigo. Patient denies: ear pain, drainage. Patient also denies barotrauma, vestibular suppressant use, ototoxic medication use Prior ear surgery:  no  --------------------------------------------------------- 05/31/2024 Nasal wise, she feels like she is doing better --- dripping better, pressure is improved. Using flonase  and astelin . And using rinses. Still having issues with the right ear, but left ear feeling a bit more full. Doing PO anthistamine. Did not seem like the pred helped her. Feels like hearing is not as good. No ear pain, drainage. She did not take augmentin  because it was a large pill   AP/AC: no Tobacco: yes, current  PMHx: COPD, AR, GERD, Hypothyroidism, Cervical Stenosis, Migraines  RADIOGRAPHIC EVALUATION AND INDEPENDENT REVIEW OF OTHER RECORDS:: Labs CMP and TSH (01/07/2024) and CBC (09/2023): TSH wnl, BUN/Cr 15/0.76, WBC 6.0, Eos 0 CT Face 08/22/2022 independently interpreted with respect to ears and sinuses: noted left septal deviation, L>R max with inferior MPT likely mucoid retention cyst, mild b/l ethmoid opacification, sphenoid sinuses relatively clear with hypoplastic frontal sinus; right mastoid opacification but ME well aerated, no noted ossicular chain disruption/pathology or otic capsule; no obvious evidence of tegmen dehiscence/encephalocele [name] (01/07/2024): more speels when she sits to stand up with dizzy/blurry vision; no ear fullness or pain; chronic sinus issue; Dx: Chronic sinus issues, BPPV; Rx: nef to ENT, meclizine  PRN 04/2024 Audiogram was independently reviewed and interpreted by me and it reveals -- AD/AS: C/A tymp;  Right ear- Normal to moderate mixed hearing loss from 250 Hz - 8000 Hz. Left ear-  Normal to moderately severe sensorineural hearing loss from 250 Hz - 8000 Hz. Right ear: 100% was obtained at a presentation level of 70 dBHL with contralateral masking which is deemed as  excellent. Left ear: 100% was obtained at a presentation level of 65 dBHL with contralateral masking which is deemed as  excellent.   SNHL= Sensorineural hearing loss  Past Medical History:  Diagnosis Date    Allergic rhinitis    Arthritis    Bronchitis    Cervical spondylitis with radiculitis    Cervical stenosis of spine    COPD (chronic obstructive pulmonary disease) (HCC)    Excessive or frequent menstruation    GERD (gastroesophageal reflux disease)    History of kidney stones    History of migraines    Hypothyroidism    Intermittent claudication    Mixed hyperlipidemia    Nonspecific abnormal electrocardiogram (ECG) (EKG)    Pre-diabetes    Pure hypercholesterolemia    Tobacco use    Tobacco use disorder    Unspecified adjustment reaction    Past Surgical History:  Procedure Laterality Date   ANTERIOR CERVICAL DECOMP/DISCECTOMY FUSION N/A 10/28/2023   Procedure: ANTERIOR CERVICAL DECOMPRESSION/DISCECTOMY FUSION 3 LEVELS;  Surgeon: Clois Fret, MD;  Location: [facility] ORS;  Service: Neurosurgery;  Laterality: N/A;  C4-7 ACDF   COLONOSCOPY     ESOPHAGOGASTRODUODENOSCOPY     HAMMER TOE SURGERY Bilateral    PELVIC LAPAROSCOPY     exploratory, thought might have a cyst, not seen   TUBAL LIGATION     Family History  Problem Relation Age of Onset   Other Father        No contact--unknown   Breast cancer Sister    Thyroid  disease Daughter    Anxiety disorder Daughter    OCD Daughter    Rectal cancer Neg Hx    Esophageal cancer Neg Hx    Stomach cancer Neg Hx    Colon cancer Neg Hx    Social History   Tobacco Use   Smoking status: Every Day    Current packs/day: 1.50    Average packs/day: 1.5 packs/day for 45.9 years (68.9 ttl pk-yrs)    Types: Cigarettes    Start date: 1980   Smokeless tobacco: Never   Tobacco comments:    Pt states she smokes 0.5- 1 ppd due to boredom. AB, CMA 03-14-24  Substance Use Topics   Alcohol use: Yes    Alcohol/week: 1.0 standard drink of alcohol    Types: 1 Glasses of wine per week    Comment: occasional wine   Allergies  Allergen Reactions   Benadryl [Diphenhydramine Hcl] Other (See Comments)    Makes patient extremely drowsy      Hydrocodone      States has headaches from such   Levaquin  [Levofloxacin ]     arthralgia   Current Outpatient Medications  Medication Sig Dispense Refill   acetaminophen  (TYLENOL ) 500 MG tablet Take 1,000 mg by mouth every 6 (six) hours as needed for moderate pain (pain score 4-6).     APPLE CIDER VINEGAR PO Take 1 capsule by mouth daily.     atorvastatin  (LIPITOR) 10 MG tablet Take 1 tablet (10 mg total) by mouth daily. 90 tablet 1   azelastine  (ASTELIN ) 0.1 % nasal spray Place 2 sprays into both nostrils 2 (two) times daily. Use in each nostril as directed 30 mL 12   bismuth  subsalicylate (PEPTO BISMOL) 262 MG/15ML suspension Take 15 mLs by mouth every 6 (six) hours as needed for indigestion or diarrhea or loose stools.     cetirizine  (ZYRTEC ) 10 MG tablet Take 1 tablet (10 mg total) by mouth daily. 90 tablet 3   fluticasone  (FLONASE ) 50 MCG/ACT nasal spray Place 2 sprays into both nostrils daily. 16 g 6   Fluticasone -Umeclidin-Vilant (TRELEGY ELLIPTA ) 100-62.5-25 MCG/ACT AEPB Inhale 1 puff into the lungs daily.  14 each 0   meclizine  (ANTIVERT ) 12.5 MG tablet Take 1/2 to one tablet po every day prn vertigo 30 tablet 0   meloxicam  (MOBIC ) 15 MG tablet Take 1 tablet (15 mg total) by mouth daily. 14 tablet 0   methocarbamol  (ROBAXIN ) 500 MG tablet Take 1 tablet (500 mg total) by mouth every 8 (eight) hours as needed for muscle spasms. Can make you sleepy. 60 tablet 0   montelukast  (SINGULAIR ) 10 MG tablet Take 1 tablet (10 mg total) by mouth at bedtime. 90 tablet 3   Multiple Vitamin (MULTIVITAMIN WITH MINERALS) TABS tablet Take 1 tablet by mouth daily.     Omeprazole -Sodium Bicarbonate (ZEGERID) 20-1100 MG CAPS capsule Take 1 capsule by mouth daily as needed (heartburn).     SYNTHROID  125 MCG tablet Take 1 tablet (125 mcg total) by mouth daily before breakfast. 30 tablet 1   No current facility-administered medications for this visit.   BP 114/75 (BP Location: Left Arm, Patient Position:  Sitting, Cuff Size: Normal)   Pulse 87   Ht 5' 1 (1.549 m)   Wt 133 lb (60.3 kg)   LMP 04/06/2012   SpO2 94%   BMI 25.13 kg/m   PHYSICAL EXAM:  BP 114/75 (BP Location: Left Arm, Patient Position: Sitting, Cuff Size: Normal)   Pulse 87   Ht 5' 1 (1.549 m)   Wt 133 lb (60.3 kg)   LMP 04/06/2012   SpO2 94%   BMI 25.13 kg/m    Salient findings:  CN II-XII intact Bilateral EAC clear and TM intact with well pneumatized middle ear space on left - small air/fluid level posteriorly right Weber 512: right Rinne 512: AC > BC Left; AC=BC Right Nose: Anterior rhinoscopy reveals modest septal deviation, bilateral inferior turbinates with mild hypertrophy.  Nasal endoscopy was indicated to better evaluate the nose and paranasal sinuses, given the patient's history and exam findings, and is detailed below. No lesions of oral cavity/oropharynx No obviously palpable neck masses/lymphadenopathy/thyromegaly; neck incision healing appropriately No respiratory distress or stridor   PROCEDURE:  Prior to initiating any procedures, risks/benefits/alternatives were explained to the patient and verbal consent obtained. Diagnostic Nasal Endoscopy Pre-procedure diagnosis: Concern for sinusitis, facial pressure Post-procedure diagnosis: same Indication: See pre-procedure diagnosis and physical exam above Complications: None apparent EBL: 0 mL Anesthesia: Lidocaine  4% and topical decongestant was topically sprayed in each nasal cavity  Description of Procedure:  Patient was identified. A rigid 30 degree endoscope was utilized to evaluate the sinonasal cavities, mucosa, sinus ostia and turbinates and septum.  Overall, signs of mucosal inflammation are mild but some mucoid secretions noted along floor of nose.  No mucopurulence, polyps, or masses noted.   Right Middle meatus: clear Right SE Recess: clear Left MM: clear Left SE Recess: clear No masses over ET orifice bilaterally Photodocumentation was  obtained.  CPT CODE -- 31231 - Mod 25   ASSESSMENT:  63 y.o. with:  No diagnosis found.  Noted multiple issues today: Seasonal sinonasal symptoms with nasal congestion and postnasal drip. Symptoms include sinus pressure, nasal discharge. Endo overall without purulence but given persistent symptoms, will treat maximally medically. She has tried medical management but inconsistently. We also discussed possible contribution from smoking and allergies; offered RAST, declined. Prior CT without significant disease. - Start Flonase , two sprays each nostril twice daily. - Start Astelin  spray, two sprays each nostril twice daily. - Daily neti pot or saline rinse with distilled water. - Augmentin  BID x 10 days. - Prednisone  20 mg  x7d  Right ear effusion with mixed hearing loss Right ear effusion with mixed hearing loss, suspect due to ETD. No infection today - Perform Valsalva maneuver several times daily. - Will treat as above for ETD - Follow-up with hearing test in early February.  - if no improvement, consider imaging  Given the patient's tobacco use, I also discussed cessation and options for cessation, including counseling. Counseled patient on the dangers of tobacco use, advised patient to stop smoking, and reviewed strategies to maximize success. Total time spent with this was 4 minutes.      See below regarding exact medications prescribed this encounter including dosages and route: No orders of the defined types were placed in this encounter.    Thank you for allowing me the opportunity to care for your patient. Please do not hesitate to contact me should you have any other questions.  Sincerely, Eldora Blanch, MD Otolaryngologist (ENT), [facility] Phone: [phone] Fax: [phone]  MDM:  Level 4: [phone] Complex4ity/Problems addressed: mod  Data complexity: high - independent interpretation of CT; review of note, labs - Morbidity: mod  - Prescription  Drug prescribed or managed: y  05/31/2024, 11:57 AM

## 2024-05-31 NOTE — Patient Instructions (Signed)
 I have ordered an imaging study for you to complete prior to your next visit. Please call [facility] Scheduling at [phone] to schedule your imaging if you have not received a call within 24 hours. If you are unable to complete your imaging study prior to your next scheduled visit please call our office to let us  know.

## 2024-06-02 ENCOUNTER — Ambulatory Visit (INDEPENDENT_AMBULATORY_CARE_PROVIDER_SITE_OTHER): Admitting: Orthopedic Surgery

## 2024-06-02 ENCOUNTER — Telehealth: Payer: Self-pay | Admitting: Neurosurgery

## 2024-06-02 ENCOUNTER — Encounter: Payer: Self-pay | Admitting: Orthopedic Surgery

## 2024-06-02 DIAGNOSIS — M19031 Primary osteoarthritis, right wrist: Secondary | ICD-10-CM

## 2024-06-02 NOTE — Telephone Encounter (Signed)
 Patient stopped by our office to let our office know that she is still having pain on the left side of her neck. She states that she is scheduled to begin PT on 06/07/2024 but would like to see about getting an MRI to see why she is still having this pain. Please advise.

## 2024-06-02 NOTE — Telephone Encounter (Signed)
 Patient agrees to wait until her appointment in January at this time but will call if symptoms get worse before then.

## 2024-06-02 NOTE — Telephone Encounter (Signed)
 Spoke with patient. Patient is still having left side neck pain and left arm and hand numbness. She is doing PT (notes in media) and therapy helps with her neck to a certain point. She is taking Methocarbamol  500 mg 1 tablet at bedtime. She alternates taking Tylenol  500 mg 2 once or twice a day, and Ibuprofen 200 mg 2 a day. Meloxicam  15 mg was giving to her for her hand pain and numbness and it does not help the hand or neck. No new weakness. She states everything is healing well since her cervical fusion in May aside from the symptoms above. She just feels like something is not right and maybe going on and would like to get an MRI to make sure something is not missed. She would prefer [facility] location for MRI

## 2024-06-02 NOTE — Progress Notes (Signed)
 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 year old returns to [employer] 6 weeks since last visit on 04/16/2019 25-4 right tenosynovitis of extensor carpi radialis and primary osteoarthritis of DRUJ.  Patient prescribed meloxicam  15 mg daily.  Patient has started course of physical therapy.  Patient states she has had no improvement or worsening.  Continues to have similar pain.  Primarily located over ulnar styloid with associated edema.  Radiates along ulnar aspect of wrist and forearm.  Associated numbness/tingling in ring and pinky/little finger.  Patient is right-hand dominant.  Using compression sleeve purchased from [employer].      Past Medical History:  Diagnosis Date   Allergic rhinitis    Arthritis    Bronchitis    Cervical spondylitis with radiculitis    Cervical stenosis of spine    COPD (chronic obstructive pulmonary disease) (HCC)    Excessive or frequent menstruation    GERD (gastroesophageal reflux disease)    History of kidney stones    History of migraines    Hypothyroidism    Intermittent claudication    Mixed hyperlipidemia    Nonspecific abnormal electrocardiogram (ECG) (EKG)    Pre-diabetes    Pure hypercholesterolemia    Tobacco use    Tobacco use disorder    Unspecified adjustment reaction    Past Surgical History:  Procedure Laterality Date   ANTERIOR CERVICAL DECOMP/DISCECTOMY FUSION N/A 10/28/2023   Procedure: ANTERIOR CERVICAL DECOMPRESSION/DISCECTOMY FUSION 3 LEVELS;  Surgeon: Clois Fret, MD;  Location: [facility] ORS;  Service: Neurosurgery;  Laterality: N/A;  C4-7 ACDF   COLONOSCOPY     ESOPHAGOGASTRODUODENOSCOPY     HAMMER TOE SURGERY Bilateral    PELVIC LAPAROSCOPY     exploratory, thought might have a cyst, not seen   TUBAL LIGATION     Current Medications[1] Allergies[2] Social  History   Socioeconomic History   Marital status: Married    Spouse name: Not on file   Number of children: 3   Years of education: Not on file   Highest education level: 9th grade  Occupational History   Occupation: company secretary  Tobacco Use   Smoking status: Every Day    Current packs/day: 1.50    Average packs/day: 1.5 packs/day for 45.9 years (68.9 ttl pk-yrs)    Types: Cigarettes    Start date: 1980   Smokeless tobacco: Never   Tobacco comments:    Pt states she smokes 0.5- 1 ppd due to boredom. AB, CMA 03-14-24  Vaping Use   Vaping status: Former   Substances: Nicotine   Substance and Sexual Activity   Alcohol use: Yes    Alcohol/week: 1.0 standard drink of alcohol    Types: 1 Glasses of wine per week    Comment: occasional wine   Drug use: No    Comment: tried pot once, didn't like it   Sexual activity: Yes    Partners: Male    Birth control/protection: Surgical  Other Topics Concern   Not on file  Social History Narrative   Married--much stress at home with marriage      3 children         Social Drivers of Health   Tobacco Use: High Risk (05/31/2024)   Patient History    Smoking Tobacco Use: Every Day    Smokeless Tobacco Use: Never    Passive Exposure: Not on  file  Financial Resource Strain: Medium Risk (05/25/2024)   Overall Financial Resource Strain (CARDIA)    Difficulty of Paying Living Expenses: Somewhat hard  Food Insecurity: No Food Insecurity (05/25/2024)   Epic    Worried About Programme Researcher, Broadcasting/film/video in the Last Year: Never true    Ran Out of Food in the Last Year: Never true  Transportation Needs: No Transportation Needs (05/25/2024)   Epic    Lack of Transportation (Medical): No    Lack of Transportation (Non-Medical): No  Physical Activity: Insufficiently Active (05/25/2024)   Exercise Vital Sign    Days of Exercise per Week: 2 days    Minutes of Exercise per Session: 10 min  Stress: Stress Concern Present (05/25/2024)   [employer] - Occupational Stress Questionnaire    Feeling of Stress: To some extent  Social Connections: Moderately Isolated (05/25/2024)   Social Connection and Isolation Panel    Frequency of Communication with Friends and Family: More than three times a week    Frequency of Social Gatherings with Friends and Family: Once a week    Attends Religious Services: Patient declined    Database Administrator or Organizations: No    Attends Engineer, Structural: Not on file    Marital Status: Married  Catering Manager Violence: Not At Risk (10/28/2023)   Humiliation, Afraid, Rape, and Kick questionnaire    Fear of Current or Ex-Partner: No    Emotionally Abused: No    Physically Abused: No    Sexually Abused: No  Depression (PHQ2-9): Medium Risk (01/07/2024)   Depression (PHQ2-9)    PHQ-2 Score: 7  Alcohol Screen: Low Risk (05/25/2024)   Alcohol Screen    Last Alcohol Screening Score (AUDIT): 1  Housing: High Risk (05/25/2024)   Epic    Unable to Pay for Housing in the Last Year: Yes    Number of Times Moved in the Last Year: 0    Homeless in the Last Year: No  Utilities: Not At Risk (10/28/2023)   AHC Utilities    Threatened with loss of utilities: No  Health Literacy: Not on file   Family History  Problem Relation Age of Onset   Other Father        No contact--unknown   Breast cancer Sister    Thyroid  disease Daughter    Anxiety disorder Daughter    OCD Daughter    Rectal cancer Neg Hx    Esophageal cancer Neg Hx    Stomach cancer Neg Hx    Colon cancer Neg Hx      ROS: A review of systems was performed and is negative unless stated above in HPI    OBJECTIVE:    Constitutional:   The patient is alert and oriented x 3, appears to be stated age and in no distress.   Orthopaedic Examination:  On gross examination of the right upper extremity the skin is intact.  Right hand inspection reveals prominent ulnar styloid process.  Moderate tenderness  with palpation over ulnar styloid.  Patient able to make composite fist.  5 out of 5 motor strength.  Sensation intact to the median, ulnar and radial nerve distribution of the hand.  Full range of motion, however pain with ulnar and radial deviation and wrist flexion and extension.  Negative Finkelstein test.   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  Imaging from last office visit revealed increase in dorsal deviation of distal ulna.  Patient using compression splint, performing physical therapy, and has completed 2-week course of prescription NSAID/meloxicam .  No improvement with conservative management.  Patient interested in further evaluation by hand specialist.  Referral placed for Dr. Arlinda with [facility] in [place].  All questions and concerns were answered to the best of my ability.  Patient can call any time with further concerns.   Oneil Horde, MD Orthopedic Surgery & Sports Medicine [facility]      [1]  Current Outpatient Medications:    acetaminophen  (TYLENOL ) 500 MG tablet, Take 1,000 mg by mouth every 6 (six) hours as needed for moderate pain (pain score 4-6)., Disp: , Rfl:    APPLE CIDER VINEGAR PO, Take 1 capsule by mouth daily., Disp: , Rfl:    atorvastatin  (LIPITOR) 10 MG tablet, Take 1 tablet (10 mg total) by mouth daily., Disp: 90 tablet, Rfl: 1   azelastine  (ASTELIN ) 0.1 % nasal spray, Place 2 sprays into both nostrils 2 (two) times daily. Use in each nostril as directed, Disp: 30 mL, Rfl: 12   bismuth  subsalicylate (PEPTO BISMOL) 262 MG/15ML suspension, Take 15 mLs by mouth every 6 (six) hours as needed for indigestion or diarrhea or loose stools., Disp: , Rfl:    cetirizine  (ZYRTEC ) 10 MG tablet, Take 1 tablet (10 mg total) by mouth daily., Disp: 90 tablet, Rfl:  3   doxycycline  (VIBRA -TABS) 100 MG tablet, Take 1 tablet (100 mg total) by mouth 2 (two) times daily for 10 days., Disp: 20 tablet, Rfl: 0   fluticasone  (FLONASE ) 50 MCG/ACT nasal spray, Place 2 sprays into both nostrils daily., Disp: 16 g, Rfl: 6   Fluticasone -Umeclidin-Vilant (TRELEGY ELLIPTA ) 100-62.5-25 MCG/ACT AEPB, Inhale 1 puff into the lungs daily., Disp: 14 each, Rfl: 0   meclizine  (ANTIVERT ) 12.5 MG tablet, Take 1/2 to one tablet po every day prn vertigo, Disp: 30 tablet, Rfl: 0   meloxicam  (MOBIC ) 15 MG tablet, Take 1 tablet (15 mg total) by mouth daily., Disp: 14 tablet, Rfl: 0   methocarbamol  (ROBAXIN ) 500 MG tablet, Take 1 tablet (500 mg total) by mouth every 8 (eight) hours as needed for muscle spasms. Can make you sleepy., Disp: 60 tablet, Rfl: 0   montelukast  (SINGULAIR ) 10 MG tablet, Take 1 tablet (10 mg total) by mouth at bedtime., Disp: 90 tablet, Rfl: 3   Multiple Vitamin (MULTIVITAMIN WITH MINERALS) TABS tablet, Take 1 tablet by mouth daily., Disp: , Rfl:    Omeprazole -Sodium Bicarbonate (ZEGERID) 20-1100 MG CAPS capsule, Take 1 capsule by mouth daily as needed (heartburn)., Disp: , Rfl:    SYNTHROID  125 MCG tablet, Take 1 tablet (125 mcg total) by mouth daily before breakfast., Disp: 30 tablet, Rfl: 1 [2]  Allergies Allergen Reactions   Benadryl [Diphenhydramine Hcl] Other (See Comments)    Makes patient extremely drowsy     Hydrocodone      States has headaches from such   Levaquin  [Levofloxacin ]     arthralgia

## 2024-06-02 NOTE — Telephone Encounter (Signed)
 She is s/p ACDF C4-C7 on 10/28/23.   She will be getting xrays prior to her visit with Dr. Clois on 07/22/23. I recommend she discuss further imaging with him at that time.   If she does not want to wait, then let me know and I will discuss with him.   Thanks!

## 2024-06-06 ENCOUNTER — Ambulatory Visit (HOSPITAL_COMMUNITY)

## 2024-06-10 ENCOUNTER — Ambulatory Visit (HOSPITAL_COMMUNITY)
Admission: RE | Admit: 2024-06-10 | Discharge: 2024-06-10 | Disposition: A | Discharge status: Home / Self Care | Source: Ambulatory Visit | Attending: Otolaryngology | Admitting: Otolaryngology

## 2024-06-10 DIAGNOSIS — H90A31 Mixed conductive and sensorineural hearing loss, unilateral, right ear with restricted hearing on the contralateral side: Secondary | ICD-10-CM | POA: Insufficient documentation

## 2024-06-10 DIAGNOSIS — H748X1 Other specified disorders of right middle ear and mastoid: Secondary | ICD-10-CM | POA: Diagnosis not present

## 2024-06-10 DIAGNOSIS — H6991 Unspecified Eustachian tube disorder, right ear: Secondary | ICD-10-CM | POA: Insufficient documentation

## 2024-06-13 ENCOUNTER — Telehealth: Payer: Self-pay | Admitting: Family

## 2024-06-13 NOTE — Telephone Encounter (Signed)
 Copied from CRM #[id number]. Topic: Referral - Status >> Jun 13, 2024 12:43 PM Nessti S wrote: Reason for CRM: pt called to speak with leslie to check on status for thyroid  referral

## 2024-06-14 NOTE — Telephone Encounter (Signed)
 This referral was sent internally to [facility] on 05/30/24. Patient can call to schedule her appt  [facility] [address] [place], [place] [address] Phone: [phone]  I left her a detailed VM with contact information for their office.

## 2024-06-22 ENCOUNTER — Ambulatory Visit (HOSPITAL_COMMUNITY): Admission: RE | Admit: 2024-06-22 | Source: Ambulatory Visit

## 2024-06-22 ENCOUNTER — Encounter (HOSPITAL_COMMUNITY): Payer: Self-pay

## 2024-06-26 NOTE — Progress Notes (Unsigned)
 "  Lauren Beck - 64 y.o. female MRN [id number]  Date of birth: 11-Nov-1960  Office Visit Note: Visit Date: 06/27/2024 PCP: Corwin Antu, FNP Referred by: Onesimo Oneil LABOR, MD  Subjective: No chief complaint on file.  HPI: Lauren Beck is a pleasant 64 y.o. female who presents today for ongoing right wrist pain.  She has been sent to me by Dr. Onesimo for specific hand surgical evaluation.  Prior x-rays do show elements of ulnar positivity with concern for potential impaction.***  Pertinent ROS were reviewed with the patient and found to be negative unless otherwise specified above in HPI.   Visit Reason: Duration of symptoms: Hand dominance: {RIGHT/LEFT:20294} Occupation: Diabetic: {yes/no:20286} Smoking: {yes/no:20286} Heart/Lung History: Blood Thinners:   Prior Testing/EMG: Injections (Date): Treatments: Prior Surgery:    Assessment & Plan: Visit Diagnoses: No diagnosis found.  Plan: ***  Follow-up: No follow-ups on file.   Meds & Orders: No orders of the defined types were placed in this encounter.  No orders of the defined types were placed in this encounter.    Procedures: No procedures performed      Clinical History: No specialty comments available.  She reports that she has been smoking cigarettes. She started smoking about 46 years ago. She has a 69 pack-year smoking history. She has never used smokeless tobacco.  Recent Labs    10/19/23 1108 01/07/24 0910 05/26/24 1259  HGBA1C 6.1 6.1 5.7    Objective:   Vital Signs: LMP 04/06/2012   Physical Exam  Gen: Well-appearing, in no acute distress; non-toxic CV: Regular Rate. Well-perfused. Warm.  Resp: Breathing unlabored on room air; no wheezing. Psych: Fluid speech in conversation; appropriate affect; normal thought process  Ortho Exam - ***   Imaging: No results found.  Past Medical/Family/Surgical/Social History: Medications & Allergies reviewed per EMR, new medications updated. 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   Benign paroxysmal positional vertigo due to bilateral vestibular disorder 01/07/2024   Cervical radiculopathy 10/28/2023   Cervical stenosis of spinal canal 10/28/2023   Left arm weakness 10/28/2023   S/P cervical spinal fusion 10/28/2023   Chronic left shoulder pain 10/28/2023   Tobacco abuse 10/19/2023   Pre-operative examination for internal medicine 10/19/2023   Bilirubin in urine 04/27/2023   High serum vitamin B12 04/27/2023   Non-seasonal allergic rhinitis 07/24/2022   COPD (chronic obstructive pulmonary disease) with chronic bronchitis (HCC) 05/12/2022   Prediabetes 05/12/2022   Intermittent claudication 05/12/2022   DOE (dyspnea on exertion) 02/04/2022   Cervical spondylitis with radiculitis 02/04/2022   Neuralgia 02/04/2022   Postmenopausal 11/14/2021   Mixed hyperlipidemia 11/14/2021   Acquired hypothyroidism 11/14/2021   Gastroesophageal reflux disease 11/14/2021   Past Medical History:  Diagnosis Date   Allergic rhinitis    Arthritis    Bronchitis    Cervical spondylitis with radiculitis    Cervical stenosis of spine    COPD (chronic obstructive pulmonary disease) (HCC)    Excessive or frequent menstruation    GERD (gastroesophageal reflux disease)    History of kidney stones    History of migraines    Hypothyroidism    Intermittent claudication    Mixed hyperlipidemia    Nonspecific abnormal electrocardiogram (ECG) (EKG)    Pre-diabetes    Pure hypercholesterolemia    Tobacco use    Tobacco use disorder    Unspecified adjustment reaction    Family History  Problem Relation Age of Onset  Other Father        No contact--unknown   Breast cancer Sister    Thyroid  disease Daughter    Anxiety disorder Daughter    OCD Daughter    Rectal cancer Neg Hx    Esophageal cancer Neg Hx    Stomach cancer Neg Hx    Colon cancer Neg Hx    Past Surgical History:   Procedure Laterality Date   ANTERIOR CERVICAL DECOMP/DISCECTOMY FUSION N/A 10/28/2023   Procedure: ANTERIOR CERVICAL DECOMPRESSION/DISCECTOMY FUSION 3 LEVELS;  Surgeon: Clois Fret, MD;  Location: [facility] ORS;  Service: Neurosurgery;  Laterality: N/A;  C4-7 ACDF   COLONOSCOPY     ESOPHAGOGASTRODUODENOSCOPY     HAMMER TOE SURGERY Bilateral    PELVIC LAPAROSCOPY     exploratory, thought might have a cyst, not seen   TUBAL LIGATION     Social History   Occupational History   Occupation: company secretary  Tobacco Use   Smoking status: Every Day    Current packs/day: 1.50    Average packs/day: 1.5 packs/day for 46.0 years (69.0 ttl pk-yrs)    Types: Cigarettes    Start date: 1980   Smokeless tobacco: Never   Tobacco comments:    Pt states she smokes 0.5- 1 ppd due to boredom. AB, CMA 03-14-24  Vaping Use   Vaping status: Former   Substances: Nicotine   Substance and Sexual Activity   Alcohol use: Yes    Alcohol/week: 1.0 standard drink of alcohol    Types: 1 Glasses of wine per week    Comment: occasional wine   Drug use: No    Comment: tried pot once, didn't like it   Sexual activity: Yes    Partners: Male    Birth control/protection: Surgical    Lauren Beck) Arlinda, M.D. [facility], Hand Surgery  "

## 2024-06-27 ENCOUNTER — Ambulatory Visit (INDEPENDENT_AMBULATORY_CARE_PROVIDER_SITE_OTHER): Admitting: Orthopedic Surgery

## 2024-06-27 DIAGNOSIS — M25531 Pain in right wrist: Secondary | ICD-10-CM

## 2024-07-12 ENCOUNTER — Ambulatory Visit (HOSPITAL_COMMUNITY)

## 2024-07-12 ENCOUNTER — Other Ambulatory Visit: Payer: Self-pay | Admitting: Neurosurgery

## 2024-07-12 DIAGNOSIS — G959 Disease of spinal cord, unspecified: Secondary | ICD-10-CM

## 2024-07-21 ENCOUNTER — Other Ambulatory Visit

## 2024-07-21 ENCOUNTER — Ambulatory Visit: Admitting: Neurosurgery

## 2024-08-04 ENCOUNTER — Ambulatory Visit (INDEPENDENT_AMBULATORY_CARE_PROVIDER_SITE_OTHER): Admitting: Otolaryngology

## 2024-08-04 ENCOUNTER — Ambulatory Visit (INDEPENDENT_AMBULATORY_CARE_PROVIDER_SITE_OTHER): Admitting: Audiology

## 2024-08-05 ENCOUNTER — Ambulatory Visit (HOSPITAL_COMMUNITY)
